# Patient Record
Sex: Male | Born: 1965 | Race: White | Hispanic: No | Marital: Single
Health system: Midwestern US, Academic
[De-identification: ages and names within clinical notes are randomized; demographics above are authoritative.]

---

## 2016-11-14 MED ORDER — OLANZAPINE 10 MG PO TAB
ORAL_TABLET | Freq: Every day | ORAL | 0 refills | 30.00000 days | Status: DC
Start: 2016-11-14 — End: 2017-01-15

## 2016-11-14 MED ORDER — OLANZAPINE 5 MG PO TAB
ORAL_TABLET | Freq: Every morning | ORAL | 0 refills | 30.00000 days | Status: DC
Start: 2016-11-14 — End: 2017-01-15

## 2016-12-10 MED ORDER — LITHIUM CARBONATE 150 MG PO CAP
ORAL_CAPSULE | ORAL | 0 refills | 90.00000 days | Status: DC
Start: 2016-12-10 — End: 2017-01-18

## 2016-12-25 MED ORDER — CLONAZEPAM 1 MG PO TAB
1 mg | ORAL_TABLET | Freq: Every day | ORAL | 0 refills | Status: DC
Start: 2016-12-25 — End: 2017-04-08

## 2016-12-25 MED ORDER — TEMAZEPAM 15 MG PO CAP
15 mg | ORAL_CAPSULE | Freq: Every evening | ORAL | 0 refills | Status: DC
Start: 2016-12-25 — End: 2017-01-21

## 2017-01-14 ENCOUNTER — Encounter: Admit: 2017-01-14 | Discharge: 2017-01-14 | Payer: MEDICARE

## 2017-01-15 ENCOUNTER — Encounter: Admit: 2017-01-15 | Discharge: 2017-01-15 | Payer: MEDICARE

## 2017-01-15 MED ORDER — OLANZAPINE 5 MG PO TAB
5 mg | ORAL_TABLET | Freq: Every morning | ORAL | 0 refills | 30.00000 days | Status: AC
Start: 2017-01-15 — End: 2017-01-18

## 2017-01-15 MED ORDER — OLANZAPINE 20 MG PO TAB
20 mg | ORAL_TABLET | Freq: Every evening | ORAL | 0 refills | 30.00000 days | Status: AC
Start: 2017-01-15 — End: 2017-01-18

## 2017-01-15 MED ORDER — OLANZAPINE 10 MG PO TAB
ORAL_TABLET | Freq: Every day | ORAL | 0 refills | 30.00000 days | Status: AC
Start: 2017-01-15 — End: 2017-01-18

## 2017-01-18 ENCOUNTER — Encounter: Admit: 2017-01-18 | Discharge: 2017-01-18 | Payer: MEDICARE

## 2017-01-18 MED ORDER — OLANZAPINE 20 MG PO TAB
20 mg | ORAL_TABLET | Freq: Every evening | ORAL | 0 refills | 30.00000 days | Status: AC
Start: 2017-01-18 — End: 2017-04-24

## 2017-01-18 MED ORDER — QUETIAPINE 100 MG PO TAB
ORAL_TABLET | 2 refills | Status: AC
Start: 2017-01-18 — End: 2017-04-24

## 2017-01-18 MED ORDER — LITHIUM CARBONATE 150 MG PO CAP
ORAL_CAPSULE | ORAL | 0 refills | 90.00000 days | Status: AC
Start: 2017-01-18 — End: 2017-03-13

## 2017-01-18 MED ORDER — LAMOTRIGINE 100 MG PO TAB
50 mg | ORAL_TABLET | Freq: Two times a day (BID) | ORAL | 0 refills | Status: AC
Start: 2017-01-18 — End: 2017-04-24

## 2017-01-18 MED ORDER — QUETIAPINE 300 MG PO TAB
ORAL_TABLET | 2 refills | Status: AC
Start: 2017-01-18 — End: 2017-04-24

## 2017-01-18 MED ORDER — OLANZAPINE 5 MG PO TBDI
5 mg | ORAL_TABLET | Freq: Every day | ORAL | 0 refills | 30.00000 days | Status: AC | PRN
Start: 2017-01-18 — End: 2017-04-24

## 2017-01-18 MED ORDER — OLANZAPINE 10 MG PO TAB
ORAL_TABLET | Freq: Every day | ORAL | 0 refills | 30.00000 days | Status: AC
Start: 2017-01-18 — End: 2017-04-24

## 2017-01-18 MED ORDER — OLANZAPINE 5 MG PO TAB
5 mg | ORAL_TABLET | Freq: Every morning | ORAL | 0 refills | 30.00000 days | Status: AC
Start: 2017-01-18 — End: 2017-04-17

## 2017-01-18 NOTE — Telephone Encounter
Mom called to reschedule apt because there was a death in the family and had to cancel the last apt, is not able to get into see the new doctor until mid August so needs a refill between now and then. Please give her a call if there is any questions or concerns.

## 2017-01-21 MED ORDER — TEMAZEPAM 15 MG PO CAP
15 mg | ORAL_CAPSULE | Freq: Every evening | ORAL | 0 refills | Status: AC
Start: 2017-01-21 — End: 2017-04-24

## 2017-01-21 MED ORDER — TEMAZEPAM 30 MG PO CAP
30 mg | ORAL_CAPSULE | Freq: Every evening | ORAL | 0 refills | Status: AC
Start: 2017-01-21 — End: 2017-04-23

## 2017-03-07 ENCOUNTER — Encounter: Admit: 2017-03-07 | Discharge: 2017-03-07 | Payer: MEDICARE

## 2017-03-07 ENCOUNTER — Ambulatory Visit: Admit: 2017-03-07 | Discharge: 2017-03-08 | Payer: MEDICARE

## 2017-03-07 DIAGNOSIS — G249 Dystonia, unspecified: ICD-10-CM

## 2017-03-07 DIAGNOSIS — F919 Conduct disorder, unspecified: ICD-10-CM

## 2017-03-07 DIAGNOSIS — R21 Rash and other nonspecific skin eruption: ICD-10-CM

## 2017-03-07 DIAGNOSIS — R12 Heartburn: ICD-10-CM

## 2017-03-07 DIAGNOSIS — Z79899 Other long term (current) drug therapy: ICD-10-CM

## 2017-03-07 DIAGNOSIS — F79 Unspecified intellectual disabilities: ICD-10-CM

## 2017-03-07 DIAGNOSIS — Z9181 History of falling: ICD-10-CM

## 2017-03-07 DIAGNOSIS — G988 Other disorders of nervous system: Principal | ICD-10-CM

## 2017-03-07 DIAGNOSIS — R51 Headache: ICD-10-CM

## 2017-03-07 DIAGNOSIS — F909 Attention-deficit hyperactivity disorder, unspecified type: ICD-10-CM

## 2017-03-07 DIAGNOSIS — K5900 Constipation, unspecified: ICD-10-CM

## 2017-03-07 DIAGNOSIS — K56609 Unspecified intestinal obstruction, unspecified as to partial versus complete obstruction: ICD-10-CM

## 2017-03-07 DIAGNOSIS — F840 Autistic disorder: ICD-10-CM

## 2017-03-07 DIAGNOSIS — R55 Syncope and collapse: ICD-10-CM

## 2017-03-07 DIAGNOSIS — E039 Hypothyroidism, unspecified: ICD-10-CM

## 2017-03-07 DIAGNOSIS — F209 Schizophrenia, unspecified: ICD-10-CM

## 2017-03-07 DIAGNOSIS — IMO0002 Unspecified mental or behavioral problem: ICD-10-CM

## 2017-03-07 DIAGNOSIS — F29 Unspecified psychosis not due to a substance or known physiological condition: ICD-10-CM

## 2017-03-07 DIAGNOSIS — R9431 Abnormal electrocardiogram [ECG] [EKG]: Principal | ICD-10-CM

## 2017-03-07 DIAGNOSIS — D72819 Decreased white blood cell count, unspecified: ICD-10-CM

## 2017-03-07 DIAGNOSIS — F312 Bipolar disorder, current episode manic severe with psychotic features: ICD-10-CM

## 2017-03-07 NOTE — Progress Notes
Date of Service: 03/07/2017    Canaan Schwendiman. is a 51 y.o. male.       HPI      Mr. Miyahira is followed for autism spectrum disorder, intellectual disability, and schizoaffective disorder bipolar type.  He is also been followed for chronic constipation.  He presents today for further follow-up of an abnormal ECG.  His mother takes superb care of him.  His mother indicates that last year he lost 20 pounds, but has regained 6 or 7 pounds over the past several months.  He had a fall on July 06, 2016 but reports no recurrence.  Otherwise, the patient has been doing well and reports no angina, congestive symptoms, palpitations, sensation of sustained forceful heart pounding, lightheadedness or syncope. His exercise tolerance has been stable.  His mother indicates that he is very active and walks long distances without any difficulty.  The patient reports no myalgias, bleeding abnormalities, neurologic motor abnormalities or difficulty with speech.  He likes to watch television.       Vitals:    03/07/17 1439   BP: 118/68   Pulse: 82   Weight: 60.8 kg (134 lb)   Height: 1.676 m (5' 6)     Body mass index is 21.63 kg/m???.     Past Medical History  Patient Active Problem List    Diagnosis Date Noted   ??? Weight loss 09/05/2016     Added automatically from request for surgery [id number]     ??? Sleep disorder, circadian, irregular sleep-wake type 10/07/2014   ??? Heart palpitations 06/03/2014   ??? Atypical chest pain 06/03/2014   ??? Incomplete right bundle branch block 06/03/2014   ??? Autism spectrum disorder with accompanying language impairment and intellectual disability, requiring substantial support 09/08/2013   ??? Bowel obstruction (HCC) 09/08/2013   ??? Encounter for long-term (current) use of other medications 03/04/2012   ??? Constipation 10/28/2010   ??? Schizoaffective disorder, bipolar type (HCC) 12/22/2008   ??? 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 Lithium poisoning 07/29/2008 ??? Delirium 07/29/2008   ??? Lithium adverse reaction 07/22/2008   ??? Acute hypernatremia 07/22/2008   ??? 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 Abdominal pain 07/22/2008   ??? Nausea & vomiting 07/22/2008   ??? Nonspecific ST-T wave electrocardiographic changes 07/22/2008   ??? Disturbance of skin sensation 08/26/2006   ??? Syncope and collapse 08/26/2006         Review of Systems   Constitution: Positive for weight loss.   HENT: Negative.    Eyes: Negative.    Respiratory: Negative.    Endocrine: Negative.    Hematologic/Lymphatic: Negative.    Skin: Positive for rash.   Musculoskeletal: Positive for neck pain and stiffness.   Gastrointestinal: Positive for bloating, abdominal pain, constipation and flatus.   Genitourinary: Positive for hesitancy.   Neurological: Positive for difficulty with concentration.   Psychiatric/Behavioral: Positive for altered mental status, depression and hallucinations. The patient has insomnia and is nervous/anxious.    Allergic/Immunologic: Positive for environmental allergies.       Physical Exam  GENERAL: The patient is slender but resting comfortably and in no distress.   HEENT: No abnormalities of the visible oro-nasopharynx, conjunctiva or sclera are noted.  NECK: There is no jugular venous distension. Carotids are palpable and without bruits. There is no thyroid enlargement.  Chest: Lung fields are clear to auscultation. There are no wheezes or crackles.  CV: There is a regular rhythm. The first  and second heart sounds are normal. There are no murmurs, gallops or rubs.  ABD: The abdomen is soft and supple with normal bowel sounds. There is no hepatosplenomegaly, ascites, tenderness, masses or bruits.  Neuro: There are no focal motor defects. Ambulation is normal. Cognitive function is abnormal.  He does follow commands promptly and can answer simple questions with a phrase  Ext: There is no edema or evidence of deep vein thrombosis. Peripheral pulses are satisfactory. SKIN: There are no rashes and no cellulitis  PSYCH: The patient is calm, rationale and oriented.    Cardiovascular Studies  A 12-lead ECG was obtained on 03/07/2017 reveals normal sinus rhythm with a rate of 82 bpm.  Incomplete right bundle branch block is noted with a QRS duration of 110 ms an ECG obtained on July 06, 2016 revealed normal sinus rhythm with a heart rate of 64 and right bundle branch block was present.    Labs from 08/02/2016 revealed total cholesterol 172, triglycerides 78, HDL 60 and LDL 99 mg/dL.  His hemoglobin A1c was 4.9%.  His TSH was normal at 4.69.  Labs from 05/29/2016 revealed ALT = 17.    A Holter monitor obtained on February 18, 2017 revealed normal sinus rhythm with a heart rate range of 62-142 bpm and a median heart rate of 81 bpm.  No arrhythmias were appreciated.  No symptoms were recorded.  This is a normal Holter monitor.    Echo-Doppler 06/14/14:  1. No regional wall motion abnormalities are seen. Overall LV systolic function appears normal. The estimated left ventricular ejection fraction is 60%.  2. Normal left ventricular diastolic function. ???  3. Right ventricular contractility appears normal.  4. Normal chamber dimensions.  5. There is no evidence of significant valvular regurgitation or stenosis by doppler exam.  6. No pericardial effusion is seen.    Problems Addressed Today  Abnormal ECG.  Hypercholesterolemia  Assessment and Plan     The patient reports no chest discomfort or congestive symptoms and he reports no palpitations or lightheadedness.  Mr. Lax has incomplete right bundle branch block, bordering on right bundle branch block this does not require further evaluation at this time.    His blood pressure appears to be well controlled. I have asked the patient to keep a log book of his BP readings and to report systolic BP readings exceeding 140 mm Hg. Regular mild aerobic exercise and adherence to a heart healthy diet were recommended. I have asked the patient to return for follow-up in 12 months.         Current Medications (including today's revisions)  ??? aspirin EC 81 mg tablet Take 81 mg by mouth daily. Take with food.   ??? atorvastatin (LIPITOR) 20 mg tablet Take 20 mg by mouth at bedtime daily.   ??? bisacodyl (DULCOLAX) 5 mg tablet Take 1 Tab by mouth twice daily.   ??? clonazePAM (KLONOPIN) 1 mg tablet Take 1 tablet by mouth daily.   ??? DOCOSAHEXANOIC ACID/EPA (FISH OIL PO) Take 2 Caps by mouth daily.   ??? docusate (COLACE) 100 mg capsule Take 100 mg by mouth twice daily.   ??? electrolyte GUT PEG (NULYTELY, COLYTE, GAVILYTE-N) 420 gram oral solution 2 day split dose   ??? glucosamine(+) 500 mg tab Take 500 mg by mouth daily.   ??? lamoTRIgine (LAMICTAL) 100 mg tablet Take 0.5 tablets by mouth twice daily.   ??? levothyroxine (SYNTHROID) 88 mcg tablet Take 88 mcg by mouth daily.   ??? linaclotide(+) (  LINZESS) 290 mcg capsule Take 1 capsule by mouth daily 30 minutes before breakfast.   ??? lithium carbonate (ESKALITH) 150 mg capsule Take 2 capsules in the morning and 3 capsules in the evening. Take with food.   ??? magnesium citrate oral solution Take 296 mL by mouth as Needed.   ??? magnesium 400 mEq.   ??? OLANZapine (ZYPREXA ZYDIS) 5 mg rapid dissolve tablet Dissolve 1 tablet by mouth daily as needed.   ??? OLANZapine (ZYPREXA) 10 mg tablet Take 1 tablet by mouth daily between noon and 2pm   ??? OLANZapine (ZYPREXA) 20 mg tablet Take 1 tablet by mouth at bedtime daily.   ??? OLANZapine (ZYPREXA) 5 mg tablet Take 1 tablet by mouth every morning.   ??? polyethylene glycol 3350 (GLYCOLAX; MIRALAX) 17 gram/dose powder Take 17 g by mouth twice daily.   ??? QUEtiapine (SEROQUEL) 100 mg tablet Take one tab am (with 300mg  for total dose 400mg ) and one tab at 2pm (with 300mg  for total dose 400mg )   ??? QUEtiapine (SEROQUEL) 300 mg tablet Take one tab am (with 100mg  tab for total dose 400mg ), one tab at 2pm (with 100mg  tab for total dose 400mg ), and two tabs at HS ??? senna (SENOKOT) 8.6 mg tablet Take 2 Tabs by mouth twice daily.   ??? sorbitol 70 % solution Take 15 mL by mouth daily as needed. constipation   ??? temazepam (RESTORIL) 15 mg capsule Take 1 capsule by mouth at bedtime daily.   ??? temazepam (RESTORIL) 30 mg capsule Take 1 capsule by mouth at bedtime daily.

## 2017-03-13 ENCOUNTER — Encounter: Admit: 2017-03-13 | Discharge: 2017-03-13 | Payer: MEDICARE

## 2017-03-13 MED ORDER — LITHIUM CARBONATE 150 MG PO CAP
ORAL_CAPSULE | ORAL | 0 refills | 90.00000 days | Status: AC
Start: 2017-03-13 — End: 2017-04-24

## 2017-03-14 ENCOUNTER — Encounter: Admit: 2017-03-14 | Discharge: 2017-03-14 | Payer: MEDICARE

## 2017-04-08 ENCOUNTER — Encounter: Admit: 2017-04-08 | Discharge: 2017-04-08 | Payer: MEDICARE

## 2017-04-08 MED ORDER — CLONAZEPAM 1 MG PO TAB
1 mg | ORAL_TABLET | Freq: Every day | ORAL | 0 refills | Status: AC
Start: 2017-04-08 — End: 2017-07-04

## 2017-04-16 ENCOUNTER — Encounter: Admit: 2017-04-16 | Discharge: 2017-04-16 | Payer: MEDICARE

## 2017-04-17 MED ORDER — OLANZAPINE 5 MG PO TAB
5 mg | ORAL_TABLET | Freq: Every morning | ORAL | 0 refills | 30.00000 days | Status: AC
Start: 2017-04-17 — End: 2017-04-24

## 2017-04-22 ENCOUNTER — Encounter: Admit: 2017-04-22 | Discharge: 2017-04-22 | Payer: MEDICARE

## 2017-04-23 MED ORDER — TEMAZEPAM 30 MG PO CAP
30 mg | ORAL_CAPSULE | Freq: Every evening | ORAL | 1 refills | Status: AC
Start: 2017-04-23 — End: 2017-07-31

## 2017-04-23 NOTE — Progress Notes
Subjective:       History of Present Illness  [name] is a 51 year old male with a history of Schizoaffective Disorder, bipolar type, Atypical autism, ADHD, mild intellectual disability, and aggression who presents for a follow up appointment with his mother who is his legal guardian.  Patient was last seen in feb 2018, and at that time was experiencing irritability for which Lamictal was increased.   ??????  Past medications: Clozaril (neutropenia); Depakote; Loxapine; Trileptal; Ritalin(for several years as a child); Elavil (worse), Trileptal, Strattera-arm dystonias, Flurazepam, Ambien, Melatonin Wellbutrin, tegretol and Neurontin d/c recently)  ???  ???Pt's guardian reports he has been struggling with constipation. He Has been doing much better with requirements of prns. He is needing them every other day. His mom reports he is still irritable and gets in their faces regularly about what he wants. His sleep is 99% of the time tolerable. But he does have some bad days with early awakening. He reports that he is pretty good today. He reports that sometimes he feels sad when people tease him. His mom reports that he does seems sad and demanding most of the time.      Pt's mom reports that there is not anything she can pinpoint that has improved over the past year. She reports that AH have probably worsened over the last year.   She reports that currently things are mostly tolerable.     Denies thoughts of harming self or others.     Pt's mom reports that he does regularly respond to internal stimuli of voices and he seems to be afraid of them. He also used to say the devil was in the living room but that has resolved.   ???  Pt states takes his/her medications as prescribed and denies any side effects at this time.  ???  Past medications: Clozaril (neutropenia); Depakote; Loxapine; Trileptal; Ritalin(for several years as a child); Elavil (worse), Trileptal, Strattera-arm dystonias, Flurazepam, Ambien, Melatonin Wellbutrin, tegretol and Neurontin d/c recently)  ???  Social History Update:  He lives with mother, father, brother (2 years younger). He attends day program: M,W,F for a workshop.    ???  Substance use:   Alcohol: denies  Tobacco: denies   Illicit substance: denies  ???    Review of Systems   HENT: Negative for congestion.    Respiratory: Negative for shortness of breath.    Gastrointestinal: Positive for abdominal pain and constipation.   Psychiatric/Behavioral: Negative for hallucinations, sleep disturbance and suicidal ideas.       Objective:         ??? aspirin EC 81 mg tablet Take 81 mg by mouth daily. Take with food.   ??? atorvastatin (LIPITOR) 20 mg tablet Take 20 mg by mouth at bedtime daily.   ??? bisacodyl (DULCOLAX) 5 mg tablet Take 1 Tab by mouth twice daily.   ??? clonazePAM (KLONOPIN) 1 mg tablet Take one tablet by mouth daily.   ??? DOCOSAHEXANOIC ACID/EPA (FISH OIL PO) Take 2 Caps by mouth daily.   ??? docusate (COLACE) 100 mg capsule Take 100 mg by mouth twice daily.   ??? electrolyte GUT PEG (NULYTELY, COLYTE, GAVILYTE-N) 420 gram oral solution 2 day split dose   ??? glucosamine(+) 500 mg tab Take 500 mg by mouth daily.   ??? lamoTRIgine (LAMICTAL) 100 mg tablet Take 0.5 tablets by mouth twice daily.   ??? levothyroxine (SYNTHROID) 88 mcg tablet Take 88 mcg by mouth daily.   ??? linaclotide(+) Karlene Einstein)  290 mcg capsule Take 1 capsule by mouth daily 30 minutes before breakfast.   ??? lithium carbonate (ESKALITH) 150 mg capsule TAKE 2 CAPSULES BY MOUTH EVERY MORNING AND 3 CAPS IN THE EVENING WITH FOOD   ??? magnesium citrate oral solution Take 296 mL by mouth as Needed.   ??? magnesium 400 mEq.   ??? OLANZapine (ZYPREXA ZYDIS) 5 mg rapid dissolve tablet Dissolve 1 tablet by mouth daily as needed.   ??? OLANZapine (ZYPREXA) 10 mg tablet Take 1 tablet by mouth daily between noon and 2pm   ??? OLANZapine (ZYPREXA) 20 mg tablet Take 1 tablet by mouth at bedtime daily. ??? OLANZapine (ZYPREXA) 5 mg tablet Take one tablet by mouth every morning.   ??? polyethylene glycol 3350 (GLYCOLAX; MIRALAX) 17 gram/dose powder Take 17 g by mouth twice daily.   ??? QUEtiapine (SEROQUEL) 100 mg tablet Take one tab am (with 300mg  for total dose 400mg ) and one tab at 2pm (with 300mg  for total dose 400mg )   ??? QUEtiapine (SEROQUEL) 300 mg tablet Take one tab am (with 100mg  tab for total dose 400mg ), one tab at 2pm (with 100mg  tab for total dose 400mg ), and two tabs at HS   ??? senna (SENOKOT) 8.6 mg tablet Take 2 Tabs by mouth twice daily.   ??? sorbitol 70 % solution Take 15 mL by mouth daily as needed. constipation   ??? temazepam (RESTORIL) 15 mg capsule Take 1 capsule by mouth at bedtime daily.   ??? temazepam (RESTORIL) 30 mg capsule Take one capsule by mouth at bedtime daily.     Vitals:    04/24/17 1310   BP: 104/86   Pulse: 101   Weight: 63 kg (138 lb 12.8 oz)     Body mass index is 22.4 kg/m???.     Physical Exam   Psychiatric:   MENTAL STATUS EXAMINATION  General/Constitutional: 51 yo appears stated age, dressed in personal clothes, fair groomed  Eye Contact: fair  Behavior:  Cooperative, with some interupting  Speech:  RRR with normal volume and tone.  Fair articulation  Mood: pretty good  Affect: blunted ; mood  congruent  Thought Process:  Linear and goal directed  Thought Content:  denies SI, HI. No evidence of delusions  Perception:  Denies AVH  Associations:  Intact  Insight/Judgment: limited/limited    Physical Exam:  Neuro:  No gross neurologic deficits.   Musculoskeletal:  Moves all four extremities spontaneously       Metabolic monitoring:  Metabolic monitoring:     Body mass index is 22.4 kg/m???.  Wt Readings from Last 3 Encounters:   04/24/17 63 kg (138 lb 12.8 oz)   03/07/17 60.8 kg (134 lb)   09/12/16 63.5 kg (140 lb)     BP Readings from Last 3 Encounters:   04/24/17 104/86   03/07/17 118/68   09/12/16 (!) 148/96     No Data Recorded  Lab Results   Component Value Date CHOL 172 08/02/2016    TRIG 78 08/02/2016    HDL 60 08/02/2016    LDL 99 08/02/2016    VLDL 16 08/02/2016    NONHDLCHOL 87 06/18/2013    CHOLHDLC 3 08/02/2016     Hemoglobin A1C   Date Value Ref Range Status   08/02/2016 4.9 4.5 - 6.5 % Final         Assessment and Plan:  IMPRESSION:   Schizoaffective disorder, Bipolar type  Autism Spectrum Disorder  ADHD  ID  History of Tic Disorder  Hypothyroidism, pancreatitis, HLD  ???  ???  PLAN:  ???  - Continue case management with Ericka Pontiff  - Continue Zyprexa 5mg  QAM, 10mg  at noon, and 20mg  QHS for aggression with additional 5mg  prn for severe agitation.   - Increase to Lamictal 50mg  qam and 100mg  QHS: likely titrate up to 200mg  daily at next visit.   - Continue Seroquel 400mg , 400mg  and 600mg  for behaviors/mood.               - lipids and HA1c WNL on 08/02/16               -AIMS 1 tongue fasciculations on 04/24/17  - Continue Lithium at 300mg  QAM and 450mg  QHS for mood               - Lithium level at 0.8 on 08/02/16  - Continue Clonazepam 1mg  QHS for irritability and sleep disturbance  - Continue Restoril 45mg  po qhs for sleep.  - strongly  recommend following up with PCP to monitor/ work up weight loss and constipation  - Continue working with GI on constipation/swallow study as this could contribute to behaviors.      - RTC  3 months for 60 minute appointment   ???  Discussed safety plan if the patient were to have thoughts of harming themselves or others. Patient was instructed to call 911 and/or come to the emergency room. Patient was agreeable.   ???  Medication risks and benefits were discussed with patient. Patient was agreeable to medication changes noted above.   ???  Discussed the dangers of respiratory depression and death when using alcohol, opioids, and benzodiazepines. Patient understood the risks.   ???  Discussed risks of metabolic side effects with antipsychotic use including frequent lab draws to monitor HA1c, lipids, and BMI. Patient was understanding.   ??? The proposed treatment plan was discussed with the patient/guardian who was provided the opportunity to ask questions and make suggestions regarding alternative treatment.   ???  Patient was discussed with Dr. Alphonsus Sias who agrees with the plan above.

## 2017-04-24 ENCOUNTER — Ambulatory Visit: Admit: 2017-04-24 | Discharge: 2017-04-24 | Payer: MEDICARE

## 2017-04-24 ENCOUNTER — Encounter: Admit: 2017-04-24 | Discharge: 2017-04-24 | Payer: MEDICARE

## 2017-04-24 DIAGNOSIS — F79 Unspecified intellectual disabilities: ICD-10-CM

## 2017-04-24 DIAGNOSIS — F909 Attention-deficit hyperactivity disorder, unspecified type: ICD-10-CM

## 2017-04-24 DIAGNOSIS — G249 Dystonia, unspecified: ICD-10-CM

## 2017-04-24 DIAGNOSIS — F29 Unspecified psychosis not due to a substance or known physiological condition: ICD-10-CM

## 2017-04-24 DIAGNOSIS — E039 Hypothyroidism, unspecified: ICD-10-CM

## 2017-04-24 DIAGNOSIS — F312 Bipolar disorder, current episode manic severe with psychotic features: ICD-10-CM

## 2017-04-24 DIAGNOSIS — IMO0002 Unspecified mental or behavioral problem: ICD-10-CM

## 2017-04-24 DIAGNOSIS — R12 Heartburn: ICD-10-CM

## 2017-04-24 DIAGNOSIS — F902 Attention-deficit hyperactivity disorder, combined type: ICD-10-CM

## 2017-04-24 DIAGNOSIS — F840 Autistic disorder: ICD-10-CM

## 2017-04-24 DIAGNOSIS — R51 Headache: ICD-10-CM

## 2017-04-24 DIAGNOSIS — F209 Schizophrenia, unspecified: ICD-10-CM

## 2017-04-24 DIAGNOSIS — R55 Syncope and collapse: ICD-10-CM

## 2017-04-24 DIAGNOSIS — G988 Other disorders of nervous system: Principal | ICD-10-CM

## 2017-04-24 DIAGNOSIS — K56609 Unspecified intestinal obstruction, unspecified as to partial versus complete obstruction: ICD-10-CM

## 2017-04-24 DIAGNOSIS — F919 Conduct disorder, unspecified: ICD-10-CM

## 2017-04-24 DIAGNOSIS — K5900 Constipation, unspecified: ICD-10-CM

## 2017-04-24 DIAGNOSIS — G4723 Circadian rhythm sleep disorder, irregular sleep wake type: ICD-10-CM

## 2017-04-24 DIAGNOSIS — Z79899 Other long term (current) drug therapy: ICD-10-CM

## 2017-04-24 DIAGNOSIS — Z9181 History of falling: ICD-10-CM

## 2017-04-24 DIAGNOSIS — F250 Schizoaffective disorder, bipolar type: Principal | ICD-10-CM

## 2017-04-24 DIAGNOSIS — D72819 Decreased white blood cell count, unspecified: ICD-10-CM

## 2017-04-24 DIAGNOSIS — R21 Rash and other nonspecific skin eruption: ICD-10-CM

## 2017-04-24 MED ORDER — TEMAZEPAM 15 MG PO CAP
15 mg | ORAL_CAPSULE | Freq: Every evening | ORAL | 1 refills | Status: AC
Start: 2017-04-24 — End: 2017-07-31

## 2017-04-24 MED ORDER — QUETIAPINE 100 MG PO TAB
ORAL_TABLET | 2 refills | Status: AC
Start: 2017-04-24 — End: 2017-07-31

## 2017-04-24 MED ORDER — LAMOTRIGINE 100 MG PO TAB
ORAL_TABLET | 1 refills | Status: AC
Start: 2017-04-24 — End: 2017-07-31

## 2017-04-24 MED ORDER — OLANZAPINE 5 MG PO TBDI
5 mg | ORAL_TABLET | Freq: Every day | ORAL | 1 refills | 30.00000 days | Status: AC | PRN
Start: 2017-04-24 — End: 2017-07-31

## 2017-04-24 MED ORDER — LITHIUM CARBONATE 150 MG PO CAP
ORAL_CAPSULE | ORAL | 1 refills | 90.00000 days | Status: AC
Start: 2017-04-24 — End: 2017-07-31

## 2017-04-24 MED ORDER — OLANZAPINE 20 MG PO TAB
20 mg | ORAL_TABLET | Freq: Every evening | ORAL | 1 refills | 30.00000 days | Status: AC
Start: 2017-04-24 — End: 2017-07-31

## 2017-04-24 MED ORDER — OLANZAPINE 5 MG PO TAB
5 mg | ORAL_TABLET | Freq: Every morning | ORAL | 1 refills | 30.00000 days | Status: AC
Start: 2017-04-24 — End: 2017-07-31

## 2017-04-24 MED ORDER — OLANZAPINE 10 MG PO TAB
ORAL_TABLET | Freq: Every day | ORAL | 1 refills | 30.00000 days | Status: AC
Start: 2017-04-24 — End: 2017-07-31

## 2017-04-24 MED ORDER — QUETIAPINE 300 MG PO TAB
ORAL_TABLET | 2 refills | Status: AC
Start: 2017-04-24 — End: 2017-07-31

## 2017-04-24 NOTE — Progress Notes
ATTENDING NOTE      Encounter Date: 04/24/2017    I saw and evaluated Chad Randall., discussed with Earlyne Iba, MD and concur with the assessment and treatment plan.     PRESENTING PROBLEM AND BACKGROUND: Patient is 51 y.o. male with schizoaffective disorder, autism, ADHD, and mild MR living with parents for past 10 years. He has history of major mood swings and aggressive behavior and has been relatively stable on Zyprexa, Seroquel, Tegretol, lithium, clonazepam, and lamotrigine. Attempts to taper or discontinue some of these medications has resulted in exacerbation of symptoms and resumption of previous doses. Exception has been successful taper and discontinuation of Ability.  Weight and metabolic labs remained in normal range with persistent problem with constipation followed by GI doc. Of note, prior trial of clonidine for hyperactivity ineffective. Parents could not recall trial of stimulants. Last visit no change in medications.    CURRENT TREATMENT AND RESPONSES: Patient's mother reported irritability persists requiring an rare prn dose olanzapine in addition to 35 mg dose he receives daily. Mood is good, no physical aggression although intermittently irritable.  Attending day program without reports of problem behavior. Severe constipation continues and is follwed by GI doc. Mother agreeable to increaisng lamotrigine and maintaining other medications at current doses.         PLAN:  1. Continue quetiapine 1400 mg/d in divided doses, olanzapine 30 mg qd in divided dose, lithium 750 mg qd (last blood level 0.8), clonazepam 1 mg qd,and temazepam 45 mg qhs  2. Increase lamotrigine from 50 mg qhs and 50 mg qam to 100 mg qhs and 50 mg qam  3. Continue to monitor metabolic syndrome labs and weight closely  4. Monitor for TD with AIMS  5. Check previous records for clozapine and stimulant trials  6. Monitor aggression, drowsiness, affect, anxiety, insomnia, appetite, constipation closely

## 2017-05-20 ENCOUNTER — Encounter: Admit: 2017-05-20 | Discharge: 2017-05-20 | Payer: MEDICARE

## 2017-05-20 DIAGNOSIS — R634 Abnormal weight loss: Principal | ICD-10-CM

## 2017-05-20 MED ORDER — SODIUM CHLORIDE 0.9 % IV SOLP
INTRAVENOUS | 0 refills | Status: CN
Start: 2017-05-20 — End: ?

## 2017-05-20 MED ORDER — PEG-ELECTROLYTE SOLN 420 GRAM PO SOLR
0 refills | Status: AC
Start: 2017-05-20 — End: 2017-12-03

## 2017-05-20 NOTE — Telephone Encounter
Prep for case entered for EGDEUS & Colonoscopy asap at [facility] only with Dr. Milta Deiters or Dr. Keenan Bachelor.  Patient given 2 day golytely prep sheet today during office visit patient was present during Mother's visit with Dr. Jomarie Longs.

## 2017-05-23 ENCOUNTER — Encounter: Admit: 2017-05-23 | Discharge: 2017-05-23 | Payer: MEDICARE

## 2017-05-23 NOTE — Telephone Encounter
Spoke with pt mother Josem Kaufmann on file to discuss [place]) pt is scheduled for EGD/EUS/Colon with Dr. Arnetha Gula soonest available 06/17/17 1500. (Pt mother has requested pt be placed on cancellation list. Take complete).     Pt mother denies pt being diabetic or on blood thinning medication. Pt mother is  Aware that pt will need to complete 2 day 2 kit bowel prep. Pt mother  is aware of location of procedure. Pt is aware they will need a driver due to IV sedation. All questions answered. Prep instructions mailed to pts home address. No further f/u needed at this time.

## 2017-05-27 ENCOUNTER — Ambulatory Visit: Admit: 2017-05-27 | Discharge: 2017-05-27 | Payer: MEDICARE

## 2017-05-27 ENCOUNTER — Encounter: Admit: 2017-05-27 | Discharge: 2017-05-27 | Payer: MEDICARE

## 2017-05-27 DIAGNOSIS — F29 Unspecified psychosis not due to a substance or known physiological condition: ICD-10-CM

## 2017-05-27 DIAGNOSIS — K56609 Unspecified intestinal obstruction, unspecified as to partial versus complete obstruction: ICD-10-CM

## 2017-05-27 DIAGNOSIS — R55 Syncope and collapse: ICD-10-CM

## 2017-05-27 DIAGNOSIS — R12 Heartburn: ICD-10-CM

## 2017-05-27 DIAGNOSIS — F79 Unspecified intellectual disabilities: ICD-10-CM

## 2017-05-27 DIAGNOSIS — D72819 Decreased white blood cell count, unspecified: ICD-10-CM

## 2017-05-27 DIAGNOSIS — Z9181 History of falling: ICD-10-CM

## 2017-05-27 DIAGNOSIS — G988 Other disorders of nervous system: Principal | ICD-10-CM

## 2017-05-27 DIAGNOSIS — F909 Attention-deficit hyperactivity disorder, unspecified type: ICD-10-CM

## 2017-05-27 DIAGNOSIS — K5900 Constipation, unspecified: ICD-10-CM

## 2017-05-27 DIAGNOSIS — F209 Schizophrenia, unspecified: ICD-10-CM

## 2017-05-27 DIAGNOSIS — Z125 Encounter for screening for malignant neoplasm of prostate: ICD-10-CM

## 2017-05-27 DIAGNOSIS — E039 Hypothyroidism, unspecified: ICD-10-CM

## 2017-05-27 DIAGNOSIS — R350 Frequency of micturition: Principal | ICD-10-CM

## 2017-05-27 DIAGNOSIS — Z79899 Other long term (current) drug therapy: ICD-10-CM

## 2017-05-27 DIAGNOSIS — G249 Dystonia, unspecified: ICD-10-CM

## 2017-05-27 DIAGNOSIS — IMO0002 Unspecified mental or behavioral problem: ICD-10-CM

## 2017-05-27 DIAGNOSIS — R51 Headache: ICD-10-CM

## 2017-05-27 DIAGNOSIS — R21 Rash and other nonspecific skin eruption: ICD-10-CM

## 2017-05-27 DIAGNOSIS — R399 Unspecified symptoms and signs involving the genitourinary system: ICD-10-CM

## 2017-05-27 DIAGNOSIS — R310 Gross hematuria: ICD-10-CM

## 2017-05-27 DIAGNOSIS — F919 Conduct disorder, unspecified: ICD-10-CM

## 2017-05-27 DIAGNOSIS — F312 Bipolar disorder, current episode manic severe with psychotic features: ICD-10-CM

## 2017-05-27 DIAGNOSIS — F840 Autistic disorder: ICD-10-CM

## 2017-05-27 LAB — PSA SCREEN: Lab: 0.5 ng/mL (ref <2.01)

## 2017-05-27 NOTE — Progress Notes
Date of Service: 05/27/2017     Subjective:             Chad Ebsen. is a 51 y.o. male, new patient here for evaluation of increasing LUTS    Chief Complaint   Patient presents with   ??? Other     frequency       History of Present Illness      Chad Bea. is a 51 y.o. Male, with past medical history including schizophrenia, bipolar disorder, autism, ADHD, constipation, small bowel obstruction, taking lithium.     He presents today with his mother she reports most of his history.  Reports increasing urinary hesitancy and urinary frequency, voiding every 30 minutes, increased over the last year.  She also reports chronic dysuria.  Intermittent episodes of gross hematuria most recently this week, unaware of a trigger.  Possibly related to severe constipation.  She also reports urgency urge incontinence, occasionally wearing pull-ups can be anywhere from 3 or 4 daily.  Nocturia x2.  Straight catheterization required during ER evaluation approximately 6 months ago associated with constipation and unable to urinate at that time.  Unclear if this was for urinary retention or to obtain a urine specimen.    She reports history of recurrent urinary tract infections usually 1 or 2/year.    Bowel movement-every 5 days, working with GI planning colonoscopy June 17, 2017.  Intake-water and Gatorade.    Family history positive for mother-kidney cancer.    Denies history of kidney stones, diabetes, stroke, back surgery, pelvic trauma, tobacco, trial of overactive bladder medication      05/27/17   PVR 180 MLs, PVR #2 3cc    Questionaires: 05/27/17  UDI-6: 12  IIQ-7: 21  OAB-V8: 42  AUASS: 32  QoL: 6        Past Medical History:   Diagnosis Date   ??? ADHD (attention deficit hyperactivity disorder)    ??? Adverse effect     neuroleptic drugs   ??? Autism    ??? Behavior disturbance    ??? Bipolar affective disorder, manic, severe, with psychotic behavior (HCC)    ??? Constipation 10/28/2010   ??? Dystonia ??? Headache(784.0) 08/26/2006   ??? Heartburn symptom    ??? History of fall     usually associated with urination or getting to feet   ??? Hypothyroidism    ??? Long term use of drug     antipsychotic drugs   ??? Mental retardation    ??? Psychosis (HCC)    ??? Rash     raised red rash, itches on belly, trunk, and underarms   ??? SBO (small bowel obstruction) (HCC)    ??? Schizophrenia (HCC)    ??? Syncope and collapse 08/26/2006   ??? Unspecified disorders of nervous system    ??? Unspecified mental or behavioral problem    ??? WBC decreased          Past Surgical History:   Procedure Laterality Date   ??? HX BUNIONECTOMY     ??? HX TONSIL AND ADENOIDECTOMY     ??? HX VASECTOMY         Allergies:    Allergies   Allergen Reactions   ??? Diphenhydramine-Zinc Acetate AGITATION     Patient becomes agiitated and combative   ??? Haloperidol Lactate AGITATION     Severe agitation per mother   ??? Albuterol SEE COMMENTS     Extreme hyperactivity-per mom   ??? Amitriptyline SEE COMMENTS  Uncontrollable muscle movements per mother   ??? Antihistamine-1 AGITATION     Patient becomes agiitated and combative   ??? Atomoxetine UNKNOWN   ??? Bee Sting [Venom-Honey Bee] UNKNOWN   ??? Chlorpheniram-Dm-Acetaminophen AGITATION     Patient becomes agiitated and combative   ??? Clemastine UNKNOWN   ??? Clozaril [Clozapine] AGITATION     Extreme anger/agitation   ??? Depakote [Divalproex] SEE COMMENTS     Per mother worked well to control behavior, but caused liver tests to become abnormal so was stopped.     ??? Erythromycin RASH     Allergy recorded in SMS: E-MYCIN~Reactions: RASH   ??? Flurazepam UNKNOWN   ??? Melatonin AGITATION   ??? Phenylpropanolamine UNKNOWN   ??? Pseudoephedrine UNKNOWN   ??? Tripelennamine UNKNOWN   ??? Triprolidine UNKNOWN   ??? Zolpidem AGITATION         Social History     Social History   ??? Marital status: Single     Spouse name: N/A   ??? Number of children: N/A   ??? Years of education: N/A     Occupational History   ???  [employer] Social History Main Topics   ??? Smoking status: Never Smoker   ??? Smokeless tobacco: Never Used   ??? Alcohol use No   ??? Drug use: No   ??? Sexual activity: Not on file     Other Topics Concern   ??? Not on file     Social History Narrative   ??? No narrative on file          Family History   Problem Relation Age of Onset   ??? Other Father    ??? Bipolar Disorder Father    ??? Hypertension Father    ??? Heart Attack Father    ??? High Cholesterol Father    ??? Stroke Father    ??? Depression Father    ??? Other Paternal Grandfather    ??? Heart Attack Paternal Grandfather    ??? Hypertension Paternal Grandfather    ??? Stroke Paternal Grandfather    ??? Depression Paternal Grandfather    ??? Colon Polyps Mother    ??? High Cholesterol Mother    ??? Migraines Mother    ??? Heart Attack Maternal Grandfather    ??? Stroke Maternal Grandfather    ??? Heart Attack Paternal Grandmother    ??? Cancer-Colon Neg Hx             Review of Systems   Constitutional: Positive for chills and unexpected weight change. Negative for activity change, appetite change, diaphoresis, fatigue and fever.   HENT: Negative for congestion, hearing loss, mouth sores and sinus pressure.    Eyes: Negative for visual disturbance.   Respiratory: Negative for apnea, cough, chest tightness and shortness of breath.    Cardiovascular: Negative for chest pain, palpitations and leg swelling.   Gastrointestinal: Positive for abdominal pain. Negative for anal bleeding, blood in stool, constipation, diarrhea, nausea, rectal pain and vomiting.   Genitourinary: Positive for decreased urine volume, difficulty urinating, dysuria, enuresis, frequency and urgency.        See HPI   Musculoskeletal: Negative for arthralgias, back pain, gait problem and myalgias.   Skin: Negative for rash and wound.   Neurological: Negative for dizziness, tremors, seizures, syncope, weakness, light-headedness, numbness and headaches.   Hematological: Negative for adenopathy. Does not bruise/bleed easily. Psychiatric/Behavioral: Positive for decreased concentration and dysphoric mood. The patient is nervous/anxious.  Objective:         ??? aspirin EC 81 mg tablet Take 81 mg by mouth daily. Take with food.   ??? atorvastatin (LIPITOR) 20 mg tablet Take 20 mg by mouth at bedtime daily.   ??? bisacodyl (DULCOLAX) 5 mg tablet Take 1 Tab by mouth twice daily.   ??? clonazePAM (KLONOPIN) 1 mg tablet Take one tablet by mouth daily.   ??? DOCOSAHEXANOIC ACID/EPA (FISH OIL PO) Take 2 Caps by mouth daily.   ??? docusate (COLACE) 100 mg capsule Take 100 mg by mouth twice daily.   ??? electrolyte GUT PEG (NULYTELY, COLYTE, GAVILYTE-N) 420 gram oral solution Per your 2 day colonoscopy prep sheet   ??? electrolyte GUT PEG (NULYTELY, COLYTE, GAVILYTE-N) 420 gram oral solution 2 day split dose   ??? glucosamine(+) 500 mg tab Take 500 mg by mouth daily.   ??? lamoTRIgine (LAMICTAL) 100 mg tablet Take 1/2 tab in the morning and 1 tab at night for mood   ??? levothyroxine (SYNTHROID) 88 mcg tablet Take 88 mcg by mouth daily.   ??? linaclotide(+) (LINZESS) 290 mcg capsule Take 1 capsule by mouth daily 30 minutes before breakfast.   ??? lithium carbonate (ESKALITH) 150 mg capsule Take 2 capsules by mouth every morning and 3 caps in the evening. Take with food.   ??? magnesium citrate oral solution Take 296 mL by mouth as Needed.   ??? magnesium 400 mEq.   ??? OLANZapine (ZYPREXA ZYDIS) 5 mg rapid dissolve tablet Dissolve one tablet by mouth daily as needed.   ??? OLANZapine (ZYPREXA) 10 mg tablet Take 1 tablet by mouth daily between noon and 2pm   ??? OLANZapine (ZYPREXA) 20 mg tablet Take one tablet by mouth at bedtime daily.   ??? OLANZapine (ZYPREXA) 5 mg tablet Take one tablet by mouth every morning.   ??? polyethylene glycol 3350 (GLYCOLAX; MIRALAX) 17 gram/dose powder Take 17 g by mouth twice daily.   ??? QUEtiapine (SEROQUEL) 100 mg tablet Take one tab am (with 300mg  for total dose 400mg ) and one tab at 2pm (with 300mg  for total dose 400mg ) ??? QUEtiapine (SEROQUEL) 300 mg tablet Take one tab am (with 100mg  tab for total dose 400mg ), one tab at 2pm (with 100mg  tab for total dose 400mg ), and two tabs at HS   ??? senna (SENOKOT) 8.6 mg tablet Take 2 Tabs by mouth twice daily.   ??? sorbitol 70 % solution Take 15 mL by mouth daily as needed. constipation   ??? temazepam (RESTORIL) 15 mg capsule Take one capsule by mouth at bedtime daily.   ??? temazepam (RESTORIL) 30 mg capsule Take one capsule by mouth at bedtime daily.       Vitals:    05/27/17 0952   BP: (!) 114/93   Pulse: 71   Weight: 58.4 kg (128 lb 12.8 oz)   Height: 167.6 cm (66)       Body mass index is 20.79 kg/m???.       Physical Exam   Constitutional: He is oriented to person, place, and time. He appears well-developed and well-nourished.   HENT:   Head: Normocephalic.   Eyes: Conjunctivae are normal.   Neck: Normal range of motion.   Cardiovascular: Normal rate.    Pulmonary/Chest: Effort normal.   Abdominal: Soft.   Musculoskeletal: Normal range of motion.   Neurological: He is alert and oriented to person, place, and time.   Skin: Skin is warm and dry.   Psychiatric: He has a normal mood  and affect.     *did not complete pelvic or DRE exam, patient deferrred         Most recent available [place] labs:  Comprehensive Metabolic Profile    Lab Results   Component Value Date/Time    NA 142 05/29/2016    K 4.8 05/29/2016    CL 108 05/29/2016    CO2 25 05/29/2016    GAP 11 10/11/2015    BUN 25 05/29/2016    CR 1.21 05/29/2016    GLU 91 05/29/2016    GLU 98 12/30/2005 07:59 AM    Lab Results   Component Value Date/Time    CA 10.0 05/29/2016    PO4 2.5 09/08/2013 10:00 AM    ALBUMIN 4.7 05/29/2016    TOTPROT 6.3 05/29/2016    ALKPHOS 79 05/29/2016    AST 15 05/29/2016    ALT 17 05/29/2016    TOTBILI 0.6 05/29/2016    GFR 69 05/29/2016    GFRAA 80 05/29/2016        Urine    Lab Results   Component Value Date/Time    UCOLOR YELLOW 05/27/2017 09:58 AM    TURBID CLOUDY 05/27/2017 09:58 AM USPGR 1.006 02/08/2012 08:37 PM    UPH 7.0 02/08/2012 08:37 PM    UPROTEIN NEG 02/08/2012 08:37 PM    UAGLU NEG 02/08/2012 08:37 PM    UKET NEG 02/08/2012 08:37 PM    UBILE NEG 02/08/2012 08:37 PM    UBLD NEG 02/08/2012 08:37 PM    UROB NORMAL 02/08/2012 08:37 PM    Lab Results   Component Value Date/Time    UNIT NEG 02/08/2012 08:37 PM    ULEU NEG 02/08/2012 08:37 PM    UWBC NONE 02/08/2012 08:37 PM    URBC NONE 02/08/2012 08:37 PM          Culture   Date Value Ref Range Status   02/10/2012 NO GROWTH  Final     No urine cultures for review    PSA Screen   Date Value Ref Range Status   05/27/2017 0.58 <2.01 NG/ML Final     Comment:     REFERENCE RANGES  AGE      PSA VALUE  <50        <=1.5  50-54       <=2.0  55-59       <=3.0  60-69       <=4.0  70+        <=6.0     12/30/2005 0.50 <4.0 NG/ML Final       [place] imaging:     CT Abd/Pel with contrast 08/07/16  IMPRESSION  Unremarkable CT of the abdomen/pelvis without primary neoplasm or   metastatic disease ???    Calculated prostate volume 5.2 x 3.3 x 2.9 = 26 grams    Outside imaging: none for review    Assessment and Plan:    Problem   Lower Urinary Tract Symptoms (Luts)    Consult 05/27/17  #1 complaint - increased LUTS x 1 year  Chronic dysuria, Frequency every 30 minutes, urge and urge incontinence, occasionally wearing Depends 3-4 day, nocturia x 2  No trial of OAB medications    05/27/17   PVR 180 MLs, PVR #2 3cc         Gross Hematuria    Intermittent gross hematuria     Constipation    Working with GI  BM every 5 days  Problem List Items Addressed This Visit     Constipation     Continue plan per GI, planning colonoscopy 11/19  Was going to see if cystoscopy could be coordinated  However colonoscopy will be at [facility]         Lower urinary tract symptoms (LUTS)     Manage constipation  Complete hematuria evaluation  PSA  Hold on medications at this time         Gross hematuria     Recommend CT urogram  Will need cystoscopy, likely with light sedation Needs to be scheduled           Relevant Orders    CYTOLOGY URINES    CT ABD/PELV WO/W CONTRAST      Other Visit Diagnoses     Urinary frequency    -  Primary    Relevant Orders    POC URINE DIPSTICK MANUAL READ (Completed)    Prostate cancer screening        Relevant Orders    PSA SCREEN (Completed)        Orders Placed This Encounter   ??? CT ABD/PELV WO/W CONTRAST (Urogram)   ??? PSA SCREEN   ??? POC URINE DIPSTICK   ??? URINES CYTOLOGY       Gershon Cull, PA-C  Urology    Dr Dr. Oswaldo Milian also met with patient and determined plan of care    Will send letter to PMD - Gwenette Greet    ATTESTATION    I personally performed the key portions of the E/M visit, discussed case with Physician Assistant and concur with documentation of history, physical exam, assessment, and treatment plan unless otherwise noted.    Staff name:  Mittie Bodo, MD Date:  05/28/2017

## 2017-05-27 NOTE — Progress Notes
PVR 180 MLs

## 2017-05-28 ENCOUNTER — Encounter: Admit: 2017-05-28 | Discharge: 2017-05-28 | Payer: MEDICARE

## 2017-05-28 DIAGNOSIS — K5900 Constipation, unspecified: ICD-10-CM

## 2017-05-28 DIAGNOSIS — E039 Hypothyroidism, unspecified: ICD-10-CM

## 2017-05-28 DIAGNOSIS — R51 Headache: ICD-10-CM

## 2017-05-28 DIAGNOSIS — F312 Bipolar disorder, current episode manic severe with psychotic features: ICD-10-CM

## 2017-05-28 DIAGNOSIS — F840 Autistic disorder: ICD-10-CM

## 2017-05-28 DIAGNOSIS — G249 Dystonia, unspecified: ICD-10-CM

## 2017-05-28 DIAGNOSIS — F919 Conduct disorder, unspecified: ICD-10-CM

## 2017-05-28 DIAGNOSIS — F29 Unspecified psychosis not due to a substance or known physiological condition: ICD-10-CM

## 2017-05-28 DIAGNOSIS — R21 Rash and other nonspecific skin eruption: ICD-10-CM

## 2017-05-28 DIAGNOSIS — D72819 Decreased white blood cell count, unspecified: ICD-10-CM

## 2017-05-28 DIAGNOSIS — F909 Attention-deficit hyperactivity disorder, unspecified type: ICD-10-CM

## 2017-05-28 DIAGNOSIS — Z79899 Other long term (current) drug therapy: ICD-10-CM

## 2017-05-28 DIAGNOSIS — R12 Heartburn: ICD-10-CM

## 2017-05-28 DIAGNOSIS — K56609 Unspecified intestinal obstruction, unspecified as to partial versus complete obstruction: ICD-10-CM

## 2017-05-28 DIAGNOSIS — R55 Syncope and collapse: ICD-10-CM

## 2017-05-28 DIAGNOSIS — F209 Schizophrenia, unspecified: ICD-10-CM

## 2017-05-28 DIAGNOSIS — G988 Other disorders of nervous system: Principal | ICD-10-CM

## 2017-05-28 DIAGNOSIS — F79 Unspecified intellectual disabilities: ICD-10-CM

## 2017-05-28 DIAGNOSIS — Z9181 History of falling: ICD-10-CM

## 2017-05-28 DIAGNOSIS — IMO0002 Unspecified mental or behavioral problem: ICD-10-CM

## 2017-05-28 NOTE — Assessment & Plan Note
Recommend CT urogram  Will need cystoscopy, likely with light sedation

## 2017-05-28 NOTE — Assessment & Plan Note
Continue plan per GI, planning colonoscopy 11/19  Was going to see if cystoscopy could be coordinated  However colonoscopy will be at [facility]

## 2017-05-28 NOTE — Assessment & Plan Note
Manage constipation  Complete hematuria evaluation  PSA  Hold on medications at this time

## 2017-06-01 ENCOUNTER — Ambulatory Visit: Admit: 2017-06-01 | Discharge: 2017-06-01 | Payer: MEDICARE

## 2017-06-01 DIAGNOSIS — R310 Gross hematuria: Principal | ICD-10-CM

## 2017-06-01 LAB — POC CREATININE, RAD: Lab: 1.1 mg/dL (ref 0.4–1.24)

## 2017-06-01 MED ORDER — SODIUM CHLORIDE 0.9 % IJ SOLN
50 mL | Freq: Once | INTRAVENOUS | 0 refills | Status: CP
Start: 2017-06-01 — End: ?
  Administered 2017-06-01: 16:00:00 50 mL via INTRAVENOUS

## 2017-06-01 MED ORDER — IOPAMIDOL 76 % IV SOLN
75 mL | Freq: Once | INTRAVENOUS | 0 refills | Status: CP
Start: 2017-06-01 — End: ?
  Administered 2017-06-01: 16:00:00 75 mL via INTRAVENOUS

## 2017-06-17 ENCOUNTER — Encounter: Admit: 2017-06-17 | Discharge: 2017-06-17 | Payer: MEDICARE

## 2017-06-17 ENCOUNTER — Ambulatory Visit: Admit: 2017-06-17 | Discharge: 2017-06-17 | Payer: MEDICARE

## 2017-06-17 DIAGNOSIS — R634 Abnormal weight loss: Principal | ICD-10-CM

## 2017-06-17 MED ORDER — LACTATED RINGERS IV SOLP
0 refills | Status: DC
Start: 2017-06-17 — End: 2017-06-18
  Administered 2017-06-17: via INTRAVENOUS

## 2017-06-17 MED ORDER — PROPOFOL 10 MG/ML IV EMUL 20 ML (INFUSION)(AM)(OR)
INTRAVENOUS | 0 refills | Status: DC
Start: 2017-06-17 — End: 2017-06-18
  Administered 2017-06-17: 125 ug/kg/min via INTRAVENOUS

## 2017-06-17 MED ORDER — LIDOCAINE (PF) 200 MG/10 ML (2 %) IJ SYRG
0 refills | Status: DC
Start: 2017-06-17 — End: 2017-06-18
  Administered 2017-06-17: 80 mg via INTRAVENOUS

## 2017-06-17 MED ORDER — PROPOFOL INJ 10 MG/ML IV VIAL
0 refills | Status: DC
Start: 2017-06-17 — End: 2017-06-18
  Administered 2017-06-17: 50 mg via INTRAVENOUS
  Administered 2017-06-17: 25 mg via INTRAVENOUS
  Administered 2017-06-18: 50 mg via INTRAVENOUS
  Administered 2017-06-18: 25 mg via INTRAVENOUS

## 2017-06-17 MED ORDER — LACTATED RINGERS IV SOLP
1000 mL | Freq: Once | INTRAVENOUS | 0 refills | Status: DC
Start: 2017-06-17 — End: 2017-06-18

## 2017-06-17 NOTE — H&P (View-Only)
Pre Procedure History and Physical/Sedation Plan    Name:Chad Randall.                                                                   MRN: [id number]                 DOB:1965-08-05          Age: 51 y.o.  Date of Service: 06/17/17    Date of Procedure:  06/17/2017    Planned Procedure(s):  GI:  EGD and Colonoscopy  Sedation/Medication Plan: MAC (Monitored Anesthesia Care)  Discussion/Reviews:  Physician has discussed risks and alternatives of this type of sedation and above planned procedures with patient and parent/legal guardian  ___________________________________________________________________  Chief Complaint:  Reviewed.     History of Present Illness: Chad Randall. is a 51 y.o. male with pelvic floor dysfunction and constipatin and abdominal pain here for EGD and colonoscopy.     Previous Anesthetic/Sedation History: reviewed.     Past Medical History:   Diagnosis Date   ??? ADHD (attention deficit hyperactivity disorder)    ??? Adverse effect     neuroleptic drugs   ??? Autism    ??? Behavior disturbance    ??? Bipolar affective disorder, manic, severe, with psychotic behavior (HCC)    ??? Constipation 10/28/2010   ??? Dystonia    ??? Headache(784.0) 08/26/2006   ??? Heartburn symptom    ??? History of fall     usually associated with urination or getting to feet   ??? Hypothyroidism    ??? Long term use of drug     antipsychotic drugs   ??? Mental retardation    ??? Psychosis (HCC)    ??? Rash     raised red rash, itches on belly, trunk, and underarms   ??? SBO (small bowel obstruction) (HCC)    ??? Schizophrenia (HCC)    ??? Syncope and collapse 08/26/2006   ??? Unspecified disorders of nervous system    ??? Unspecified mental or behavioral problem    ??? WBC decreased      Past Surgical History:   Procedure Laterality Date   ??? HX BUNIONECTOMY     ??? HX TONSIL AND ADENOIDECTOMY     ??? HX VASECTOMY       Pertinent medical/surgical history reviewed  Pertinent family history reviewed  Social History   Substance Use Topics ??? Smoking status: Never Smoker   ??? Smokeless tobacco: Never Used   ??? Alcohol use No     History   Drug Use No     Allergies:  Diphenhydramine-zinc acetate; Haloperidol lactate; Albuterol; Amitriptyline; Antihistamine-1; Atomoxetine; Bee sting [venom-honey bee]; Chlorpheniram-dm-acetaminophen; Clemastine; Clozaril [clozapine]; Depakote [divalproex]; Erythromycin; Flurazepam; Melatonin; Phenylpropanolamine; Pseudoephedrine; Tripelennamine; Triprolidine; and Zolpidem  Medications  Current Facility-Administered Medications   Medication   ??? lactated ringers infusion     Review of Systems:  A 14 point review of systems was negative except for: Gastrointestinal: positive for constipation and abdominal pain           Physical Exam:  Temp: 36.8 ???C (98.2 ???F) (11/19 1441)  Pulse: 78 (11/19 1442)  Respirations: 19 PER MINUTE (11/19 1442)  BP: 132/85 (11/19 1442)  General appearance: alert and combative  Throat:  Lips, mucosa, and tongue normal. Teeth and gums normal  Lungs: clear to auscultation bilaterally  Heart: regular rate and rhythm, S1, S2 normal, no murmur, click, rub or gallop  Abdomen: soft, non-tender. Bowel sounds normal. No masses,  no organomegaly  Extremities: extremities normal, atraumatic, no cyanosis or edema  @  Airway:  airway assessment performed  Mallampati II (soft palate, uvula, fauces visible)  Anesthesia Classification:  ASA III (A patient with a severe systemic disease that limits activity, but is not incapacitating)  NPO Status: Acceptable  Pregnancy Status: N/A    Lab/Radiology/Other Diagnostic Tests  Labs:  Relevant labs reviewed      Bernita Buffy, MD  Pager [phone]

## 2017-06-18 ENCOUNTER — Ambulatory Visit: Admit: 2017-06-17 | Discharge: 2017-06-17 | Payer: MEDICARE

## 2017-06-18 ENCOUNTER — Ambulatory Visit: Admit: 2017-06-17 | Discharge: 2017-06-18 | Payer: MEDICARE

## 2017-06-18 ENCOUNTER — Encounter: Admit: 2017-06-18 | Discharge: 2017-06-18 | Payer: MEDICARE

## 2017-06-18 DIAGNOSIS — F209 Schizophrenia, unspecified: ICD-10-CM

## 2017-06-18 DIAGNOSIS — K644 Residual hemorrhoidal skin tags: ICD-10-CM

## 2017-06-18 DIAGNOSIS — F79 Unspecified intellectual disabilities: ICD-10-CM

## 2017-06-18 DIAGNOSIS — K648 Other hemorrhoids: ICD-10-CM

## 2017-06-18 DIAGNOSIS — E039 Hypothyroidism, unspecified: ICD-10-CM

## 2017-06-18 DIAGNOSIS — F840 Autistic disorder: ICD-10-CM

## 2017-06-18 DIAGNOSIS — R634 Abnormal weight loss: Principal | ICD-10-CM

## 2017-06-18 DIAGNOSIS — R198 Other specified symptoms and signs involving the digestive system and abdomen: ICD-10-CM

## 2017-06-18 NOTE — Progress Notes
Abdominal support provided by Raoshan during colonoscopy per Dr. Khan. Patient tolerating well. Will continue to monitor.

## 2017-06-18 NOTE — Telephone Encounter
Spoke to pt mother who states after colonoscopy was informed pt had retained stool despite completing 2 day 2 gallon Golytely prep, states pt was also on soft diet for 1 week prior to procedure.     Pt continues to take Linzess 290 qd. Mother denies current obstructive sx for pt but is concerned at retained stool during procedure.     Routing to Dr. Jomarie LongsGrisolano

## 2017-06-18 NOTE — Anesthesia Post-Procedure Evaluation
Post-Anesthesia Evaluation    Name: Chad HellingWilliam J Everage Jr.      MRN: [id number]     DOB: 11/16/1965     Age: 51 y.o.     Sex: male   __________________________________________________________________________     Procedure Date: 06/17/2017  Procedure: Procedure(s):  ESOPHAGOGASTRODUODENOSCOPY ENDOSCOPIC ULTRASOUND [facility]  COLONOSCOPY & EGD/EUS  ESOPHAGOGASTRODUODENOSCOPY BIOPSY  COLONOSCOPY BIOPSY      Surgeon: Surgeon(s):  Bernita Buffylyaee, Mojtaba, MD  Anette GuarneriKhan, Taimur, MD    Post-Anesthesia Vitals  BP: 130/83 (11/19 1921)  Temp: 36.4 C (97.5 F) (11/19 1904)  Pulse: 78 (11/19 1921)  SpO2: 99 % (11/19 1921)  SpO2 Pulse: 82 (11/19 1921)      Post Anesthesia Evaluation Note    Evaluation location: Pre/Post  Patient participation: recovered; patient participated in evaluation  Level of consciousness: alert    Pain score: 0  Pain management: adequate    Hydration: normovolemia  Temperature: 36.0C - 38.4C  Airway patency: adequate    Perioperative Events  Perioperative events:  no       Post-op nausea and vomiting: no PONV    Postoperative Status  Cardiovascular status: hemodynamically stable  Respiratory status: spontaneous ventilation  Follow-up needed: none        Perioperative Events  Perioperative Event: No  Emergency Case Activation: No

## 2017-06-18 NOTE — Telephone Encounter
Pts mother called questioning how to clean out blockage at the small bowel after bowel prep for procedures yesterday.     Attempted to call patient back, left detailed message with request to call back.

## 2017-06-18 NOTE — Discharge Planning (AHS/AVS)
EGD/Upper EUS/ERCP/Antegrade Enteroscopy  Post Upper Endoscopy Instructions      -Nothing to eat or drink for 1.5 hours after your procedure if you have had the numbing gargle or spray.  Start with small sips of water at _____________.  If tolerated well, you may advance your diet as tolerated or directed by your physician.      -You may have a sore throat after the procedure for 2-3 days.  Try sucrets or lozenges to help ease the pain.  If it continues please contact us.    -If you feel feverish, have a temperature of 101 degrees or higher, persistent nausea and vomiting, abdominal pain or dark stools; please notify your nurse or GI physician.    -You may have abdominal cramping following the procedure this can be relieved by belching or passing air.    -If you have redness or swelling at the IV site, place a warm, wet washcloth over the affected areas for 15 minutes, 3-4 times a day until the redness subsides.  If symptoms continue for 2-3 days, contact your regular physician.    - If you have bleeding from your mouth, over 2 tablespoons and increasing, please notify your physician.  A small amount of bleeding is normal if a biopsy or polyps were taken.  If you are vomiting blood you need to seek immediate medical attention.    - You may resume all your routine medications, if medications need to be held your physician and/or nurse will notify you post procedure.    SPECIFIC INSTRUCTIONS      OUTPATIENTS:  A. Because of sedation and lack of coordination, UNTIL TOMORROW, DO NOT:  1. Operate any motorized vehicle - this includes driving.  2. Sign any legal documents or conduct important business matters.  3. Use any dangerous machinery (chain saw, lawnmower, etc.).  4. Drink any alcoholic beverages.  Should you have any questions or concerns after your procedure please call [phone] M-F 8am-5:00 pm. After 5:00 pm, holidays or weekends call [phone] and ask for the GI Doctor on call. Colon/Lower EUS/Retrograde Enteroscopy     Post Lower Endoscopy Instructions    -If you feel feverish, have a temperature of 101 degrees or higher, persistent nausea and vomiting, abdominal pain or dark stools; please notify your nurse or GI physician.    -You may have abdominal cramping following the procedure this can be relieved by belching or passing air.    -If you have redness or swelling at the IV site, place a warm, wet washcloth over the affected areas for 15 minutes, 3-4 times a day until the redness subsides.  If symptoms continue for 2-3 days, contact your regular physician.    - If you have bleeding from your bowels over 2 tablespoons and increasing, please notify your physician.  A small amount of bleeding is normal if a biopsy or polyps were taken.    - You may resume all your routine medications, if medications need to be held your physician and/or nurse will notify you post procedure.    SPECIFIC INSTRUCTIONS  I    OUTPATIENTS:  B. Because of sedation and lack of coordination, UNTIL TOMORROW, DO NOT:  5. Operate any motorized vehicle - this includes driving.  6. Sign any legal documents or conduct important business matters.  7. Use any dangerous machinery (chain saw, lawnmower, etc.).  8. Drink any alcoholic beverages.  Should you have any questions or concerns after your procedure please call [phone] M-F 8am-5:00 pm.  After 5:00 pm, holidays or weekends call [phone] and ask for the GI Doctor on call.

## 2017-06-19 NOTE — Telephone Encounter
Likely due to his constipation, slower transit.

## 2017-06-19 NOTE — Telephone Encounter
Talked with pts mom regarding Dr. Edison SimonGrisolano's comments. Pt voices understanding & denies questions.

## 2017-06-24 ENCOUNTER — Encounter: Admit: 2017-06-24 | Discharge: 2017-06-24 | Payer: MEDICARE

## 2017-06-24 NOTE — Telephone Encounter
Received call from pt mother stating they had to reschedule OV scheduled for today due to snowy roads. Pt is rescheduled for 06/27/17    Mother inquiring regarding pathology results from EGD/Colon. Routing to Dr. Jomarie LongsGrisolano to advise on pathology.

## 2017-06-25 ENCOUNTER — Encounter: Admit: 2017-06-25 | Discharge: 2017-06-25 | Payer: MEDICARE

## 2017-06-25 DIAGNOSIS — R21 Rash and other nonspecific skin eruption: ICD-10-CM

## 2017-06-25 DIAGNOSIS — G988 Other disorders of nervous system: Principal | ICD-10-CM

## 2017-06-25 DIAGNOSIS — D72819 Decreased white blood cell count, unspecified: ICD-10-CM

## 2017-06-25 DIAGNOSIS — R12 Heartburn: ICD-10-CM

## 2017-06-25 DIAGNOSIS — K5900 Constipation, unspecified: ICD-10-CM

## 2017-06-25 DIAGNOSIS — E039 Hypothyroidism, unspecified: ICD-10-CM

## 2017-06-25 DIAGNOSIS — F909 Attention-deficit hyperactivity disorder, unspecified type: ICD-10-CM

## 2017-06-25 DIAGNOSIS — R55 Syncope and collapse: ICD-10-CM

## 2017-06-25 DIAGNOSIS — F312 Bipolar disorder, current episode manic severe with psychotic features: ICD-10-CM

## 2017-06-25 DIAGNOSIS — Z9181 History of falling: ICD-10-CM

## 2017-06-25 DIAGNOSIS — Z79899 Other long term (current) drug therapy: ICD-10-CM

## 2017-06-25 DIAGNOSIS — K56609 Unspecified intestinal obstruction, unspecified as to partial versus complete obstruction: ICD-10-CM

## 2017-06-25 DIAGNOSIS — F919 Conduct disorder, unspecified: ICD-10-CM

## 2017-06-25 DIAGNOSIS — F79 Unspecified intellectual disabilities: ICD-10-CM

## 2017-06-25 DIAGNOSIS — IMO0002 Unspecified mental or behavioral problem: ICD-10-CM

## 2017-06-25 DIAGNOSIS — F840 Autistic disorder: ICD-10-CM

## 2017-06-25 DIAGNOSIS — G249 Dystonia, unspecified: ICD-10-CM

## 2017-06-25 DIAGNOSIS — F209 Schizophrenia, unspecified: ICD-10-CM

## 2017-06-25 DIAGNOSIS — F29 Unspecified psychosis not due to a substance or known physiological condition: ICD-10-CM

## 2017-06-25 DIAGNOSIS — R51 Headache: ICD-10-CM

## 2017-06-25 NOTE — Telephone Encounter
A. Duodenal mucosa, "duodenal biopsy", biopsy:    Normal villous architecture with no diagnostic abnormalities.      B. 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     C. Colonic mucosa, "random biopsy ", biopsy:    No diagnostic abnormalities.      Needs RX for H pylori    amoxicillan 1 gram BID  Biaxin 500 mg BID  PPI BID     Take all for 2 weeks

## 2017-06-25 NOTE — Telephone Encounter
Attempted to reach pt regarding biopsy results. Requested pt to call back for results.

## 2017-06-27 ENCOUNTER — Ambulatory Visit: Admit: 2017-06-27 | Discharge: 2017-06-27 | Payer: MEDICARE

## 2017-06-27 ENCOUNTER — Encounter: Admit: 2017-06-27 | Discharge: 2017-06-27 | Payer: MEDICARE

## 2017-06-27 ENCOUNTER — Ambulatory Visit: Admit: 2017-06-27 | Discharge: 2017-06-28 | Payer: MEDICARE

## 2017-06-27 DIAGNOSIS — R55 Syncope and collapse: ICD-10-CM

## 2017-06-27 DIAGNOSIS — G988 Other disorders of nervous system: Principal | ICD-10-CM

## 2017-06-27 DIAGNOSIS — D72819 Decreased white blood cell count, unspecified: ICD-10-CM

## 2017-06-27 DIAGNOSIS — Z9181 History of falling: ICD-10-CM

## 2017-06-27 DIAGNOSIS — E039 Hypothyroidism, unspecified: ICD-10-CM

## 2017-06-27 DIAGNOSIS — F29 Unspecified psychosis not due to a substance or known physiological condition: ICD-10-CM

## 2017-06-27 DIAGNOSIS — R12 Heartburn: ICD-10-CM

## 2017-06-27 DIAGNOSIS — R51 Headache: ICD-10-CM

## 2017-06-27 DIAGNOSIS — K5909 Other constipation: ICD-10-CM

## 2017-06-27 DIAGNOSIS — K5900 Constipation, unspecified: ICD-10-CM

## 2017-06-27 DIAGNOSIS — G249 Dystonia, unspecified: ICD-10-CM

## 2017-06-27 DIAGNOSIS — K56609 Unspecified intestinal obstruction, unspecified as to partial versus complete obstruction: ICD-10-CM

## 2017-06-27 DIAGNOSIS — F909 Attention-deficit hyperactivity disorder, unspecified type: ICD-10-CM

## 2017-06-27 DIAGNOSIS — F312 Bipolar disorder, current episode manic severe with psychotic features: ICD-10-CM

## 2017-06-27 DIAGNOSIS — F840 Autistic disorder: ICD-10-CM

## 2017-06-27 DIAGNOSIS — F919 Conduct disorder, unspecified: ICD-10-CM

## 2017-06-27 DIAGNOSIS — F79 Unspecified intellectual disabilities: ICD-10-CM

## 2017-06-27 DIAGNOSIS — R634 Abnormal weight loss: Principal | ICD-10-CM

## 2017-06-27 DIAGNOSIS — K2970 Gastritis, unspecified, without bleeding: ICD-10-CM

## 2017-06-27 DIAGNOSIS — F209 Schizophrenia, unspecified: ICD-10-CM

## 2017-06-27 DIAGNOSIS — Z79899 Other long term (current) drug therapy: ICD-10-CM

## 2017-06-27 DIAGNOSIS — IMO0002 Unspecified mental or behavioral problem: ICD-10-CM

## 2017-06-27 DIAGNOSIS — R21 Rash and other nonspecific skin eruption: ICD-10-CM

## 2017-06-27 MED ORDER — CLARITHROMYCIN 500 MG PO TAB
500 mg | ORAL_TABLET | Freq: Two times a day (BID) | ORAL | 0 refills | Status: AC
Start: 2017-06-27 — End: ?

## 2017-06-27 MED ORDER — AMOXICILLIN 500 MG PO CAP
1000 mg | ORAL_CAPSULE | Freq: Two times a day (BID) | ORAL | 0 refills | 7.00000 days | Status: AC
Start: 2017-06-27 — End: ?

## 2017-06-27 MED ORDER — OMEPRAZOLE 20 MG PO CPDR
20 mg | ORAL_CAPSULE | Freq: Every day | ORAL | 0 refills | Status: AC
Start: 2017-06-27 — End: 2017-06-28

## 2017-06-27 NOTE — Progress Notes
Date of Service: 06/27/2017    Subjective:             Chad Randall. is a 51 y.o. male.    History of Present Illness    Chad Randall is a very pleasant 51 yo Caucasian male who has a long-standing history of constipation and pelvic floor dysfunction. He is a weill established patient of Dr. Jomarie Longs.  His most recent office visit with Dr. Jomarie Longs in 07/2016.  He had recently been seen in a local emergency room for worsening constipation. CT imaging revealed no evidence of bowel obstruction and was essentially normal.  He was advised to drink a half a bowel prep which led to a large bowel movement and some symptom improvement.  His regular bowel regimen included Colace 2 tabs twice a day, Dulcolax 2 tabs twice a day, double dose MiraLAX twice a day, and Linzess 290 mcg daily.  Concern at the office visit with Dr. Jomarie Longs was weight loss.  He was still eating 3 meals a day but at times did not feel hungry.  There was some occasional nausea but no vomiting.  He was advised to continue this current bowel regimen.  Lab work was also checked on 08/04/16 which included normal AM cortisol, TSH, and negative HIV and hepatitis profile.  Dr. Jomarie Longs also recommended EGD and colonoscopy for further evaluation of his symptoms which did not get performed until recently. EUS was also performed.     He presents today with his mom Chad Randall for follow-up office visit.  We discussed test results in detail.  EGD on 06/17/17 revealed normal-appearing esophagus, stomach, and duodenum.  Duodenal biopsies were negative for celiac sprue.  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  He has yet to start antibiotic therapy. EUS on 06/17/17 was normal including no evidence of pancreatic mass or chronic pancreatitis.  Colonoscopy on 06/17/17 revealed a fair bowel prep, examined portion of the ileum was normal.  Stool noted in the ascending colon although colonic mucosa in the entire colon appeared normal. Nonbleeding external and internal hemorrhoids were noted.  Random biopsies were negative for microscopic colitis.  Given fair prep repeat colonoscopy recommended in 1 year.    Currently, he continues to lose weight.  He has lost about 5 pounds since he saw Dr. Jomarie Longs in 07/2016.  Of note, his documented weight when I saw him in 09/2015 was 159 pounds.  Despite the weight loss, his mother Chad Randall says he is eating very good meals with increased portion sizes and supplementing with a protein shake daily.  May be occasional nausea but no vomiting.  No recent issues with increased constipation.  No reported rectal bleeding or melena. Chad Randall does report intermittent periumbilical abdominal pain but he has difficulty being more specific. He remains on his typical bowel regimen of Colace 2 tabs twice a day, Dulcolax 2 tabs twice a day, double dose MiraLAX twice a day, and Linzess 290 mcg daily. They will then use an enema and 2 bottles of magnesium citrate, which generally results in good evacuation if there is suspicion of constipation. He denies dysphagia or odynophagia.       Colonoscopy 02/13/11:???  Very long and redundant colon; normal mucosa in all segments. Hemorrhoids were found.   ???  PAST MEDICAL HISTORY:   1. 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 Pelvic floor testing with anorectal manometry was performed. Given Chad Randall's mental capacity this was somewhat limited testing and was felt to be inconclusive.  Sitz marker study was performed. Day five x-ray revealed 24 markers spread throughout the colon.   2. Autism.  3. Bipolar disorder.  4. Heartburn.  5. Hypothyroidism, on replacement therapy.  6. History small bowel obstruction February 2015.  7. Normal CT A/P 08/07/16.  8. Normal colonoscopy 02/13/11.  ???  FAMILY HISTORY:??????No colon cancer in family.           Review of Systems   Constitutional: Negative.  Negative for appetite change, chills, diaphoresis, fatigue, fever and unexpected weight change.   HENT: Positive for rhinorrhea and sneezing. Negative for mouth sores, sore throat, trouble swallowing and voice change.    Eyes: Negative.  Negative for pain and visual disturbance.   Respiratory: Negative.  Negative for choking.    Cardiovascular: Positive for palpitations. Negative for chest pain and leg swelling.   Gastrointestinal: Positive for constipation, diarrhea, nausea and rectal pain. Negative for abdominal distention, abdominal pain, anal bleeding, blood in stool and vomiting.   Genitourinary: Positive for difficulty urinating, dysuria and enuresis. Negative for flank pain.   Musculoskeletal: Negative.  Negative for arthralgias and back pain.   Skin: Negative.  Negative for rash.   Allergic/Immunologic: Negative.    Neurological: Negative.  Negative for light-headedness and headaches.   Hematological: Negative.  Negative for adenopathy.   Psychiatric/Behavioral: Positive for agitation, behavioral problems, confusion, decreased concentration, dysphoric mood, hallucinations and sleep disturbance. The patient is nervous/anxious and is hyperactive.    All other systems reviewed and are negative.        Objective:         ??? aspirin EC 81 mg tablet Take 81 mg by mouth daily. Take with food.   ??? atorvastatin (LIPITOR) 20 mg tablet Take 20 mg by mouth at bedtime daily.   ??? bisacodyl (DULCOLAX) 5 mg tablet Take 1 Tab by mouth twice daily.   ??? clonazePAM (KLONOPIN) 1 mg tablet Take one tablet by mouth daily.   ??? DOCOSAHEXANOIC ACID/EPA (FISH OIL PO) Take 2 Caps by mouth daily.   ??? docusate (COLACE) 100 mg capsule Take 100 mg by mouth twice daily.   ??? electrolyte GUT PEG (NULYTELY, COLYTE, GAVILYTE-N) 420 gram oral solution Per your 2 day colonoscopy prep sheet   ??? electrolyte GUT PEG (NULYTELY, COLYTE, GAVILYTE-N) 420 gram oral solution 2 day split dose   ??? glucosamine(+) 500 mg tab Take 500 mg by mouth daily. ??? lamoTRIgine (LAMICTAL) 100 mg tablet Take 1/2 tab in the morning and 1 tab at night for mood   ??? levothyroxine (SYNTHROID) 88 mcg tablet Take 88 mcg by mouth daily.   ??? linaclotide(+) (LINZESS) 290 mcg capsule Take 1 capsule by mouth daily 30 minutes before breakfast.   ??? lithium carbonate (ESKALITH) 150 mg capsule Take 2 capsules by mouth every morning and 3 caps in the evening. Take with food.   ??? magnesium citrate oral solution Take 296 mL by mouth as Needed.   ??? magnesium 400 mEq.   ??? OLANZapine (ZYPREXA ZYDIS) 5 mg rapid dissolve tablet Dissolve one tablet by mouth daily as needed.   ??? OLANZapine (ZYPREXA) 10 mg tablet Take 1 tablet by mouth daily between noon and 2pm   ??? OLANZapine (ZYPREXA) 20 mg tablet Take one tablet by mouth at bedtime daily.   ??? OLANZapine (ZYPREXA) 5 mg tablet Take one tablet by mouth every morning.   ??? polyethylene glycol 3350 (GLYCOLAX; MIRALAX) 17 gram/dose powder Take 17 g by mouth twice daily.   ??? QUEtiapine (SEROQUEL) 100  mg tablet Take one tab am (with 300mg  for total dose 400mg ) and one tab at 2pm (with 300mg  for total dose 400mg )   ??? QUEtiapine (SEROQUEL) 300 mg tablet Take one tab am (with 100mg  tab for total dose 400mg ), one tab at 2pm (with 100mg  tab for total dose 400mg ), and two tabs at HS   ??? senna (SENOKOT) 8.6 mg tablet Take 2 Tabs by mouth twice daily.   ??? sorbitol 70 % solution Take 15 mL by mouth daily as needed. constipation   ??? temazepam (RESTORIL) 15 mg capsule Take one capsule by mouth at bedtime daily.   ??? temazepam (RESTORIL) 30 mg capsule Take one capsule by mouth at bedtime daily.     Vitals:    06/27/17 1431   BP: 138/90   Pulse: 80   Temp: 36.1 ???C (97 ???F)   SpO2: 97%   Weight: 58.5 kg (129 lb)   Height: 167.6 cm (66)     Body mass index is 20.82 kg/m???.     Physical Exam   Constitutional: He is oriented to person, place, and time. He appears well-developed and well-nourished. No distress.   HENT:   Head: Normocephalic and atraumatic. Mouth/Throat: Oropharynx is clear and moist.   Eyes: Pupils are equal, round, and reactive to light. Conjunctivae are normal. Right eye exhibits no discharge. Left eye exhibits no discharge. No scleral icterus.   Neck: Normal range of motion. Neck supple. No thyromegaly present.   Cardiovascular: Normal rate, regular rhythm, normal heart sounds and intact distal pulses.    Pulmonary/Chest: Effort normal and breath sounds normal. No respiratory distress. He has no wheezes.   Abdominal: Soft. Bowel sounds are normal. He exhibits no distension and no mass. There is tenderness. There is no rebound and no guarding.   mild periumbilical tenderness, soft, no distention.   Musculoskeletal: Normal range of motion. He exhibits no edema.   Lymphadenopathy:     He has no cervical adenopathy.   Neurological: He is alert and oriented to person, place, and time. Coordination normal.   Skin: Skin is warm and dry. No rash noted. He is not diaphoretic.   Psychiatric: He has a normal mood and affect. His behavior is normal. Judgment and thought content normal.   Vitals reviewed.           Assessment and Plan:    1. 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 Currently doing fairly well on current bowel regimen.   2. Weight loss. Has lost 5# since 07/2016 OV, previously weighed 159# at time of 09/2015 OV. No clear explanation of etiology so far -has had normal a.m. cortisol and TSH, negative hepatitis profile and HIV, negative CT scan, and unremarkable overall EGD, EUS, and colonoscopy 06/17/2017 except for biopsies + for H. pylori.   3. Heartburn, controlled on daily proton pump inhibitor.    Plan:     1.  Because of his weight loss is still unclear.  Will check lab again today which will include CBC, CMP, TSH, random cortisol, and lithium level.  We will call his mom Chad Randall with results.  2.  Will order CT of the chest to further evaluate weight loss. 3.  We will treat H. pylori regimen with amoxicillin 1 g twice daily, Biaxin 500 mg twice daily, and twice daily PPI for 14 days.  His mother is to call once this regimen is completed.  They want to consider checking for eradication of H. pylori with stool testing  at some point.    4.   He will continue his current bowel regimen. Go to ER if obstructive type symptoms develop.   5.   We will have him return for follow-up in 4-6 weeks, call or return sooner if needed.  6.    Monitor weight at home, continue frequent meals and nutritional supplements.     The plan of care was discussed with Dr. Jomarie Longs while patient in clinic.  Patient and his mother were advised of the plan and voiced agreement and understanding.     Total face to face time spent with patient: 30 minutes with >50% of that time spent counseling the patient on medications, prior study results related to symptoms, differential diagnosis, and options regarding the plan of care.      Thank you for allowing me to see your patient in the office today. Please feel free to contact me if you have any questions or concerns.      This note was in part completed with Dragon, a voice to text dictation system. Some errors may have occured and persist despite my best efforts to edit this document to eliminate dictation/translation???related errors.??? If you have questions/concerns, please contact me for clarification      ???

## 2017-06-28 ENCOUNTER — Ambulatory Visit: Admit: 2017-06-28 | Discharge: 2017-06-28 | Payer: MEDICARE

## 2017-06-28 ENCOUNTER — Encounter: Admit: 2017-06-28 | Discharge: 2017-06-28 | Payer: MEDICARE

## 2017-06-28 DIAGNOSIS — IMO0002 Unspecified mental or behavioral problem: ICD-10-CM

## 2017-06-28 DIAGNOSIS — F29 Unspecified psychosis not due to a substance or known physiological condition: ICD-10-CM

## 2017-06-28 DIAGNOSIS — D72819 Decreased white blood cell count, unspecified: ICD-10-CM

## 2017-06-28 DIAGNOSIS — R12 Heartburn: ICD-10-CM

## 2017-06-28 DIAGNOSIS — F909 Attention-deficit hyperactivity disorder, unspecified type: ICD-10-CM

## 2017-06-28 DIAGNOSIS — A048 Other specified bacterial intestinal infections: ICD-10-CM

## 2017-06-28 DIAGNOSIS — F312 Bipolar disorder, current episode manic severe with psychotic features: ICD-10-CM

## 2017-06-28 DIAGNOSIS — Z79899 Other long term (current) drug therapy: ICD-10-CM

## 2017-06-28 DIAGNOSIS — R55 Syncope and collapse: ICD-10-CM

## 2017-06-28 DIAGNOSIS — G988 Other disorders of nervous system: Principal | ICD-10-CM

## 2017-06-28 DIAGNOSIS — R21 Rash and other nonspecific skin eruption: ICD-10-CM

## 2017-06-28 DIAGNOSIS — F919 Conduct disorder, unspecified: ICD-10-CM

## 2017-06-28 DIAGNOSIS — F209 Schizophrenia, unspecified: ICD-10-CM

## 2017-06-28 DIAGNOSIS — K56609 Unspecified intestinal obstruction, unspecified as to partial versus complete obstruction: ICD-10-CM

## 2017-06-28 DIAGNOSIS — F840 Autistic disorder: ICD-10-CM

## 2017-06-28 DIAGNOSIS — K5900 Constipation, unspecified: ICD-10-CM

## 2017-06-28 DIAGNOSIS — E039 Hypothyroidism, unspecified: ICD-10-CM

## 2017-06-28 DIAGNOSIS — R634 Abnormal weight loss: Principal | ICD-10-CM

## 2017-06-28 DIAGNOSIS — R51 Headache: ICD-10-CM

## 2017-06-28 DIAGNOSIS — Z9181 History of falling: ICD-10-CM

## 2017-06-28 DIAGNOSIS — F79 Unspecified intellectual disabilities: ICD-10-CM

## 2017-06-28 DIAGNOSIS — G249 Dystonia, unspecified: ICD-10-CM

## 2017-06-28 LAB — COMPREHENSIVE METABOLIC PANEL
Lab: 1.1 mg/dL (ref 0.4–1.24)
Lab: 10 mg/dL (ref 8.5–10.6)
Lab: 105 MMOL/L — ABNORMAL LOW (ref 98–110)
Lab: 107 mg/dL — ABNORMAL HIGH (ref >60)
Lab: 140 MMOL/L — ABNORMAL LOW (ref 137–147)

## 2017-06-28 LAB — TSH WITH FREE T4 REFLEX: Lab: 1.8 uU/mL — ABNORMAL HIGH (ref >60)

## 2017-06-28 LAB — CBC: Lab: 4.4 K/UL — ABNORMAL LOW (ref 4.5–11.0)

## 2017-06-28 LAB — CORTISOL,RANDOM: Lab: 7.9 ug/dL (ref 5.0–20.0)

## 2017-06-28 MED ORDER — IOHEXOL 350 MG IODINE/ML IV SOLN
75 mL | Freq: Once | INTRAVENOUS | 0 refills | Status: CP
Start: 2017-06-28 — End: ?

## 2017-06-28 MED ORDER — SODIUM CHLORIDE 0.9 % IJ SOLN
50 mL | Freq: Once | INTRAVENOUS | 0 refills | Status: CP
Start: 2017-06-28 — End: ?
  Administered 2017-06-28: 19:00:00 50 mL via INTRAVENOUS

## 2017-06-28 MED ORDER — OMEPRAZOLE 20 MG PO CPDR
20 mg | ORAL_CAPSULE | Freq: Two times a day (BID) | ORAL | 0 refills | Status: AC
Start: 2017-06-28 — End: ?

## 2017-06-28 NOTE — Telephone Encounter
Pt is on treatment for H pylori per Cherene AltesUtech NP

## 2017-07-03 ENCOUNTER — Encounter: Admit: 2017-07-03 | Discharge: 2017-07-03 | Payer: MEDICARE

## 2017-07-03 DIAGNOSIS — R634 Abnormal weight loss: Principal | ICD-10-CM

## 2017-07-03 MED ORDER — SODIUM CHLORIDE 0.9 % IV SOLP
INTRAVENOUS | 0 refills | Status: CN
Start: 2017-07-03 — End: ?

## 2017-07-03 NOTE — Telephone Encounter
Spoke to pt mother and relayed recommendations; pt mother verbalized understanding and is agreeable to stool test and H2BT. Prep instructions sent to pt via MyChart.

## 2017-07-03 NOTE — Telephone Encounter
-----   Message from Volney AmericanLora Utech, [place] sent at 07/01/2017  3:20 PM CST -----  Call mother Kathie RhodesBetty. Good news - CT chest normal. Discussed with Dr. Jomarie LongsGrisolano. Order  Hydrogen breath test to r/o small intestinal bacterial overgrowth and check fecal elastase. If negative, extensive GI workup unremarkable, will need to follow up with PCP to see if there is a non GI related problem causing weight loss.

## 2017-07-04 ENCOUNTER — Encounter: Admit: 2017-07-04 | Discharge: 2017-07-04 | Payer: MEDICARE

## 2017-07-04 MED ORDER — CLONAZEPAM 1 MG PO TAB
1 mg | ORAL_TABLET | Freq: Every day | ORAL | 0 refills | Status: AC
Start: 2017-07-04 — End: 2017-07-31

## 2017-07-05 ENCOUNTER — Ambulatory Visit: Admit: 2017-07-05 | Discharge: 2017-07-05 | Payer: MEDICARE

## 2017-07-31 ENCOUNTER — Encounter: Admit: 2017-07-31 | Discharge: 2017-07-31 | Payer: MEDICARE

## 2017-07-31 ENCOUNTER — Ambulatory Visit: Admit: 2017-07-31 | Discharge: 2017-07-31 | Payer: MEDICARE

## 2017-07-31 DIAGNOSIS — A048 Other specified bacterial intestinal infections: ICD-10-CM

## 2017-07-31 DIAGNOSIS — D72819 Decreased white blood cell count, unspecified: ICD-10-CM

## 2017-07-31 DIAGNOSIS — F909 Attention-deficit hyperactivity disorder, unspecified type: ICD-10-CM

## 2017-07-31 DIAGNOSIS — F29 Unspecified psychosis not due to a substance or known physiological condition: ICD-10-CM

## 2017-07-31 DIAGNOSIS — Z9181 History of falling: ICD-10-CM

## 2017-07-31 DIAGNOSIS — F79 Unspecified intellectual disabilities: ICD-10-CM

## 2017-07-31 DIAGNOSIS — F312 Bipolar disorder, current episode manic severe with psychotic features: ICD-10-CM

## 2017-07-31 DIAGNOSIS — F840 Autistic disorder: ICD-10-CM

## 2017-07-31 DIAGNOSIS — F250 Schizoaffective disorder, bipolar type: Principal | ICD-10-CM

## 2017-07-31 DIAGNOSIS — R12 Heartburn: ICD-10-CM

## 2017-07-31 DIAGNOSIS — G988 Other disorders of nervous system: Principal | ICD-10-CM

## 2017-07-31 DIAGNOSIS — R51 Headache: ICD-10-CM

## 2017-07-31 DIAGNOSIS — F902 Attention-deficit hyperactivity disorder, combined type: ICD-10-CM

## 2017-07-31 DIAGNOSIS — E039 Hypothyroidism, unspecified: ICD-10-CM

## 2017-07-31 DIAGNOSIS — F919 Conduct disorder, unspecified: ICD-10-CM

## 2017-07-31 DIAGNOSIS — Z79899 Other long term (current) drug therapy: ICD-10-CM

## 2017-07-31 DIAGNOSIS — R21 Rash and other nonspecific skin eruption: ICD-10-CM

## 2017-07-31 DIAGNOSIS — R55 Syncope and collapse: ICD-10-CM

## 2017-07-31 DIAGNOSIS — K5900 Constipation, unspecified: ICD-10-CM

## 2017-07-31 DIAGNOSIS — IMO0002 Unspecified mental or behavioral problem: ICD-10-CM

## 2017-07-31 DIAGNOSIS — K56609 Unspecified intestinal obstruction, unspecified as to partial versus complete obstruction: ICD-10-CM

## 2017-07-31 DIAGNOSIS — G249 Dystonia, unspecified: ICD-10-CM

## 2017-07-31 DIAGNOSIS — F209 Schizophrenia, unspecified: ICD-10-CM

## 2017-07-31 MED ORDER — LITHIUM CARBONATE 150 MG PO CAP
ORAL_CAPSULE | ORAL | 1 refills | 90.00000 days | Status: AC
Start: 2017-07-31 — End: 2017-10-30

## 2017-07-31 MED ORDER — TEMAZEPAM 15 MG PO CAP
15 mg | ORAL_CAPSULE | Freq: Every evening | ORAL | 1 refills | Status: AC
Start: 2017-07-31 — End: 2017-10-30

## 2017-07-31 MED ORDER — QUETIAPINE 100 MG PO TAB
ORAL_TABLET | 2 refills | Status: AC
Start: 2017-07-31 — End: 2017-10-30

## 2017-07-31 MED ORDER — LAMOTRIGINE 100 MG PO TAB
100 mg | ORAL_TABLET | Freq: Two times a day (BID) | ORAL | 1 refills | Status: AC
Start: 2017-07-31 — End: 2017-10-30

## 2017-07-31 MED ORDER — OLANZAPINE 5 MG PO TBDI
5 mg | ORAL_TABLET | Freq: Every day | ORAL | 1 refills | 30.00000 days | Status: AC | PRN
Start: 2017-07-31 — End: 2017-10-30

## 2017-07-31 MED ORDER — OLANZAPINE 20 MG PO TAB
20 mg | ORAL_TABLET | Freq: Every evening | ORAL | 1 refills | 30.00000 days | Status: AC
Start: 2017-07-31 — End: 2017-10-30

## 2017-07-31 MED ORDER — QUETIAPINE 300 MG PO TAB
ORAL_TABLET | 2 refills | Status: AC
Start: 2017-07-31 — End: 2017-10-30

## 2017-07-31 MED ORDER — OLANZAPINE 10 MG PO TAB
ORAL_TABLET | Freq: Every day | ORAL | 1 refills | 30.00000 days | Status: AC
Start: 2017-07-31 — End: 2017-10-30

## 2017-07-31 MED ORDER — OLANZAPINE 5 MG PO TAB
5 mg | ORAL_TABLET | Freq: Every morning | ORAL | 1 refills | 30.00000 days | Status: AC
Start: 2017-07-31 — End: 2017-10-30

## 2017-07-31 MED ORDER — TEMAZEPAM 30 MG PO CAP
30 mg | ORAL_CAPSULE | Freq: Every evening | ORAL | 1 refills | Status: AC
Start: 2017-07-31 — End: 2017-10-30

## 2017-07-31 MED ORDER — CLONAZEPAM 1 MG PO TAB
1 mg | ORAL_TABLET | Freq: Every day | ORAL | 0 refills | Status: AC
Start: 2017-07-31 — End: 2017-10-09

## 2017-08-13 ENCOUNTER — Encounter: Admit: 2017-08-13 | Discharge: 2017-08-13 | Payer: MEDICARE

## 2017-08-14 ENCOUNTER — Ambulatory Visit: Admit: 2017-08-14 | Discharge: 2017-08-14 | Payer: MEDICARE

## 2017-08-14 ENCOUNTER — Encounter: Admit: 2017-08-14 | Discharge: 2017-08-14 | Payer: MEDICARE

## 2017-08-14 DIAGNOSIS — F909 Attention-deficit hyperactivity disorder, unspecified type: ICD-10-CM

## 2017-08-14 DIAGNOSIS — K5900 Constipation, unspecified: ICD-10-CM

## 2017-08-14 DIAGNOSIS — Z79899 Other long term (current) drug therapy: ICD-10-CM

## 2017-08-14 DIAGNOSIS — G988 Other disorders of nervous system: Principal | ICD-10-CM

## 2017-08-14 DIAGNOSIS — Z9181 History of falling: ICD-10-CM

## 2017-08-14 DIAGNOSIS — F209 Schizophrenia, unspecified: ICD-10-CM

## 2017-08-14 DIAGNOSIS — R55 Syncope and collapse: ICD-10-CM

## 2017-08-14 DIAGNOSIS — F840 Autistic disorder: ICD-10-CM

## 2017-08-14 DIAGNOSIS — F29 Unspecified psychosis not due to a substance or known physiological condition: ICD-10-CM

## 2017-08-14 DIAGNOSIS — A048 Other specified bacterial intestinal infections: ICD-10-CM

## 2017-08-14 DIAGNOSIS — G249 Dystonia, unspecified: ICD-10-CM

## 2017-08-14 DIAGNOSIS — K56609 Unspecified intestinal obstruction, unspecified as to partial versus complete obstruction: ICD-10-CM

## 2017-08-14 DIAGNOSIS — F312 Bipolar disorder, current episode manic severe with psychotic features: ICD-10-CM

## 2017-08-14 DIAGNOSIS — D72819 Decreased white blood cell count, unspecified: ICD-10-CM

## 2017-08-14 DIAGNOSIS — F79 Unspecified intellectual disabilities: ICD-10-CM

## 2017-08-14 DIAGNOSIS — IMO0002 Unspecified mental or behavioral problem: ICD-10-CM

## 2017-08-14 DIAGNOSIS — R51 Headache: ICD-10-CM

## 2017-08-14 DIAGNOSIS — R21 Rash and other nonspecific skin eruption: ICD-10-CM

## 2017-08-14 DIAGNOSIS — R12 Heartburn: ICD-10-CM

## 2017-08-14 DIAGNOSIS — E039 Hypothyroidism, unspecified: ICD-10-CM

## 2017-08-14 DIAGNOSIS — F919 Conduct disorder, unspecified: ICD-10-CM

## 2017-08-14 DIAGNOSIS — R634 Abnormal weight loss: Principal | ICD-10-CM

## 2017-08-14 LAB — COMPREHENSIVE METABOLIC PANEL
Lab: 0.9 mg/dL (ref 0.3–1.2)
Lab: 1.3 mg/dL — ABNORMAL HIGH (ref 0.4–1.24)
Lab: 10 mg/dL — ABNORMAL HIGH (ref 8.5–10.6)
Lab: 105 MMOL/L (ref 98–110)
Lab: 105 U/L (ref 25–110)
Lab: 110 mg/dL — ABNORMAL HIGH (ref 70–100)
Lab: 12 U/L (ref 7–56)
Lab: 12 mg/dL (ref 7–25)
Lab: 140 MMOL/L (ref 137–147)
Lab: 17 U/L (ref 7–40)
Lab: 28 MMOL/L (ref 21–30)
Lab: 4.5 MMOL/L (ref 3.5–5.1)
Lab: 4.8 g/dL (ref 3.5–5.0)
Lab: 55 mL/min — ABNORMAL LOW (ref >60)
Lab: 7.3 g/dL (ref 6.0–8.0)
Lab: >60 mL/min (ref >60)
Lab: 7 (ref 3–12)

## 2017-08-14 LAB — LIPID PROFILE
Lab: 113 mg/dL (ref <200)
Lab: 45 mg/dL (ref <100)
Lab: 64 mg/dL (ref <150)

## 2017-08-14 LAB — LITHIUM LEVEL: Lab: 0.8 meq/L (ref >40)

## 2017-08-14 LAB — HEMOGLOBIN A1C: Lab: 5.6 % (ref 4.0–6.0)

## 2017-08-14 MED ORDER — COSYNTROPIN 0.25 MG IJ SOLR
0.25 mg | Freq: Once | INTRAVENOUS | 0 refills | Status: CN
Start: 2017-08-14 — End: ?

## 2017-08-14 MED ORDER — CIPROFLOXACIN HCL 500 MG PO TAB
500 mg | ORAL_TABLET | Freq: Every day | ORAL | 0 refills | 10.00000 days | Status: AC
Start: 2017-08-14 — End: ?

## 2017-08-15 ENCOUNTER — Encounter: Admit: 2017-08-15 | Discharge: 2017-08-15 | Payer: MEDICARE

## 2017-08-16 ENCOUNTER — Encounter: Admit: 2017-08-16 | Discharge: 2017-08-16 | Payer: MEDICARE

## 2017-08-16 LAB — T SPOT TB (QUANTIFERON TB)
Lab: 0
Lab: NEGATIVE

## 2017-08-22 ENCOUNTER — Encounter: Admit: 2017-08-22 | Discharge: 2017-08-22 | Payer: MEDICARE

## 2017-08-22 ENCOUNTER — Ambulatory Visit: Admit: 2017-08-22 | Discharge: 2017-08-23 | Payer: MEDICARE

## 2017-08-22 DIAGNOSIS — K5900 Constipation, unspecified: ICD-10-CM

## 2017-08-22 DIAGNOSIS — F919 Conduct disorder, unspecified: ICD-10-CM

## 2017-08-22 DIAGNOSIS — E039 Hypothyroidism, unspecified: ICD-10-CM

## 2017-08-22 DIAGNOSIS — IMO0002 Unspecified mental or behavioral problem: ICD-10-CM

## 2017-08-22 DIAGNOSIS — D72819 Decreased white blood cell count, unspecified: ICD-10-CM

## 2017-08-22 DIAGNOSIS — G988 Other disorders of nervous system: Principal | ICD-10-CM

## 2017-08-22 DIAGNOSIS — A048 Other specified bacterial intestinal infections: ICD-10-CM

## 2017-08-22 DIAGNOSIS — F209 Schizophrenia, unspecified: ICD-10-CM

## 2017-08-22 DIAGNOSIS — Z79899 Other long term (current) drug therapy: ICD-10-CM

## 2017-08-22 DIAGNOSIS — R12 Heartburn: ICD-10-CM

## 2017-08-22 DIAGNOSIS — F312 Bipolar disorder, current episode manic severe with psychotic features: ICD-10-CM

## 2017-08-22 DIAGNOSIS — F29 Unspecified psychosis not due to a substance or known physiological condition: ICD-10-CM

## 2017-08-22 DIAGNOSIS — R51 Headache: ICD-10-CM

## 2017-08-22 DIAGNOSIS — R21 Rash and other nonspecific skin eruption: ICD-10-CM

## 2017-08-22 DIAGNOSIS — G249 Dystonia, unspecified: ICD-10-CM

## 2017-08-22 DIAGNOSIS — F909 Attention-deficit hyperactivity disorder, unspecified type: ICD-10-CM

## 2017-08-22 DIAGNOSIS — Z9181 History of falling: ICD-10-CM

## 2017-08-22 DIAGNOSIS — F840 Autistic disorder: ICD-10-CM

## 2017-08-22 DIAGNOSIS — F79 Unspecified intellectual disabilities: ICD-10-CM

## 2017-08-22 DIAGNOSIS — K56609 Unspecified intestinal obstruction, unspecified as to partial versus complete obstruction: ICD-10-CM

## 2017-08-22 DIAGNOSIS — R55 Syncope and collapse: ICD-10-CM

## 2017-08-22 LAB — CORTISOL BASELINE: Lab: 17 ug/dL

## 2017-08-22 LAB — CORTISOL 60 MINUTES POST: Lab: 22 ug/dL

## 2017-08-22 LAB — CORTISOL 30 MINUTES POST: Lab: 20 ug/dL

## 2017-08-22 MED ORDER — COSYNTROPIN 0.25 MG IJ SOLR
0.25 mg | Freq: Once | INTRAVENOUS | 0 refills | Status: CN
Start: 2017-08-22 — End: ?

## 2017-08-22 MED ORDER — COSYNTROPIN 0.25 MG IJ SOLR
0.25 mg | Freq: Once | INTRAVENOUS | 0 refills | Status: CP
Start: 2017-08-22 — End: ?
  Administered 2017-08-22: 14:00:00 0.25 mg via INTRAVENOUS

## 2017-08-23 DIAGNOSIS — R634 Abnormal weight loss: Principal | ICD-10-CM

## 2017-08-23 LAB — ACTH: Lab: 36

## 2017-09-12 ENCOUNTER — Encounter: Admit: 2017-09-12 | Discharge: 2017-09-12 | Payer: MEDICARE

## 2017-09-12 ENCOUNTER — Ambulatory Visit: Admit: 2017-09-12 | Discharge: 2017-09-12 | Payer: MEDICARE

## 2017-09-12 ENCOUNTER — Ambulatory Visit: Admit: 2017-09-12 | Discharge: 2017-09-13 | Payer: MEDICARE

## 2017-09-12 DIAGNOSIS — K5900 Constipation, unspecified: ICD-10-CM

## 2017-09-12 DIAGNOSIS — R1084 Generalized abdominal pain: ICD-10-CM

## 2017-09-12 DIAGNOSIS — R634 Abnormal weight loss: Principal | ICD-10-CM

## 2017-09-16 ENCOUNTER — Encounter: Admit: 2017-09-16 | Discharge: 2017-09-16 | Payer: MEDICARE

## 2017-10-09 ENCOUNTER — Encounter: Admit: 2017-10-09 | Discharge: 2017-10-09 | Payer: MEDICARE

## 2017-10-09 MED ORDER — CLONAZEPAM 1 MG PO TAB
1 mg | ORAL_TABLET | Freq: Every day | ORAL | 0 refills | Status: AC
Start: 2017-10-09 — End: 2017-10-30

## 2017-10-30 ENCOUNTER — Ambulatory Visit: Admit: 2017-10-30 | Discharge: 2017-10-30 | Payer: MEDICARE

## 2017-10-30 ENCOUNTER — Encounter: Admit: 2017-10-30 | Discharge: 2017-10-30 | Payer: MEDICARE

## 2017-10-30 DIAGNOSIS — A048 Other specified bacterial intestinal infections: ICD-10-CM

## 2017-10-30 DIAGNOSIS — D72819 Decreased white blood cell count, unspecified: ICD-10-CM

## 2017-10-30 DIAGNOSIS — Z9181 History of falling: ICD-10-CM

## 2017-10-30 DIAGNOSIS — K56609 Unspecified intestinal obstruction, unspecified as to partial versus complete obstruction: ICD-10-CM

## 2017-10-30 DIAGNOSIS — R12 Heartburn: ICD-10-CM

## 2017-10-30 DIAGNOSIS — Z79899 Other long term (current) drug therapy: ICD-10-CM

## 2017-10-30 DIAGNOSIS — R21 Rash and other nonspecific skin eruption: ICD-10-CM

## 2017-10-30 DIAGNOSIS — F250 Schizoaffective disorder, bipolar type: Principal | ICD-10-CM

## 2017-10-30 DIAGNOSIS — E039 Hypothyroidism, unspecified: ICD-10-CM

## 2017-10-30 DIAGNOSIS — F79 Unspecified intellectual disabilities: ICD-10-CM

## 2017-10-30 DIAGNOSIS — R55 Syncope and collapse: ICD-10-CM

## 2017-10-30 DIAGNOSIS — R51 Headache: ICD-10-CM

## 2017-10-30 DIAGNOSIS — G249 Dystonia, unspecified: ICD-10-CM

## 2017-10-30 DIAGNOSIS — F840 Autistic disorder: Principal | ICD-10-CM

## 2017-10-30 DIAGNOSIS — K5900 Constipation, unspecified: ICD-10-CM

## 2017-10-30 DIAGNOSIS — F902 Attention-deficit hyperactivity disorder, combined type: ICD-10-CM

## 2017-10-30 MED ORDER — TEMAZEPAM 30 MG PO CAP
30 mg | ORAL_CAPSULE | Freq: Every evening | ORAL | 1 refills | Status: AC
Start: 2017-10-30 — End: 2018-02-07

## 2017-10-30 MED ORDER — QUETIAPINE 300 MG PO TAB
ORAL_TABLET | 2 refills | Status: AC
Start: 2017-10-30 — End: 2018-02-07

## 2017-10-30 MED ORDER — QUETIAPINE 100 MG PO TAB
ORAL_TABLET | 2 refills | Status: AC
Start: 2017-10-30 — End: 2018-02-07

## 2017-10-30 MED ORDER — LITHIUM CARBONATE 150 MG PO CAP
ORAL_CAPSULE | ORAL | 1 refills | 90.00000 days | Status: AC
Start: 2017-10-30 — End: 2018-02-07

## 2017-10-30 MED ORDER — OLANZAPINE 10 MG PO TAB
ORAL_TABLET | ORAL | 1 refills | 30.00000 days | Status: AC
Start: 2017-10-30 — End: 2018-02-07

## 2017-10-30 MED ORDER — TEMAZEPAM 15 MG PO CAP
15 mg | ORAL_CAPSULE | Freq: Every evening | ORAL | 1 refills | Status: AC
Start: 2017-10-30 — End: 2018-02-07

## 2017-10-30 MED ORDER — OLANZAPINE 20 MG PO TAB
20 mg | ORAL_TABLET | Freq: Every evening | ORAL | 1 refills | 30.00000 days | Status: AC
Start: 2017-10-30 — End: 2018-02-07

## 2017-10-30 MED ORDER — OLANZAPINE 5 MG PO TAB
5 mg | ORAL_TABLET | Freq: Every morning | ORAL | 1 refills | Status: CN
Start: 2017-10-30 — End: ?

## 2017-10-30 MED ORDER — LAMOTRIGINE 100 MG PO TAB
100 mg | ORAL_TABLET | Freq: Two times a day (BID) | ORAL | 1 refills | Status: AC
Start: 2017-10-30 — End: 2018-02-07

## 2017-10-30 MED ORDER — OLANZAPINE 5 MG PO TBDI
5 mg | ORAL_TABLET | Freq: Every day | ORAL | 1 refills | 30.00000 days | Status: AC | PRN
Start: 2017-10-30 — End: 2018-02-07

## 2017-10-30 MED ORDER — CLONAZEPAM 1 MG PO TAB
1 mg | ORAL_TABLET | Freq: Every day | ORAL | 0 refills | Status: AC
Start: 2017-10-30 — End: 2018-02-07

## 2017-10-31 ENCOUNTER — Encounter: Admit: 2017-10-31 | Discharge: 2017-10-31 | Payer: MEDICARE

## 2017-10-31 DIAGNOSIS — G249 Dystonia, unspecified: ICD-10-CM

## 2017-10-31 DIAGNOSIS — R12 Heartburn: ICD-10-CM

## 2017-10-31 DIAGNOSIS — K5900 Constipation, unspecified: ICD-10-CM

## 2017-10-31 DIAGNOSIS — R21 Rash and other nonspecific skin eruption: ICD-10-CM

## 2017-10-31 DIAGNOSIS — D72819 Decreased white blood cell count, unspecified: ICD-10-CM

## 2017-10-31 DIAGNOSIS — K56609 Unspecified intestinal obstruction, unspecified as to partial versus complete obstruction: ICD-10-CM

## 2017-10-31 DIAGNOSIS — F79 Unspecified intellectual disabilities: ICD-10-CM

## 2017-10-31 DIAGNOSIS — E039 Hypothyroidism, unspecified: ICD-10-CM

## 2017-10-31 DIAGNOSIS — Z9181 History of falling: ICD-10-CM

## 2017-10-31 DIAGNOSIS — R51 Headache: ICD-10-CM

## 2017-10-31 DIAGNOSIS — F840 Autistic disorder: Principal | ICD-10-CM

## 2017-10-31 DIAGNOSIS — A048 Other specified bacterial intestinal infections: ICD-10-CM

## 2017-10-31 DIAGNOSIS — R55 Syncope and collapse: ICD-10-CM

## 2017-10-31 DIAGNOSIS — Z79899 Other long term (current) drug therapy: ICD-10-CM

## 2017-11-05 ENCOUNTER — Encounter: Admit: 2017-11-05 | Discharge: 2017-11-05 | Payer: MEDICARE

## 2017-11-05 ENCOUNTER — Emergency Department: Admit: 2017-11-05 | Discharge: 2017-11-06 | Disposition: A | Discharge status: Home / Self Care | Payer: MEDICARE

## 2017-11-05 DIAGNOSIS — Z9181 History of falling: ICD-10-CM

## 2017-11-05 DIAGNOSIS — G249 Dystonia, unspecified: ICD-10-CM

## 2017-11-05 DIAGNOSIS — K56609 Unspecified intestinal obstruction, unspecified as to partial versus complete obstruction: ICD-10-CM

## 2017-11-05 DIAGNOSIS — R51 Headache: ICD-10-CM

## 2017-11-05 DIAGNOSIS — F79 Unspecified intellectual disabilities: ICD-10-CM

## 2017-11-05 DIAGNOSIS — R12 Heartburn: ICD-10-CM

## 2017-11-05 DIAGNOSIS — R21 Rash and other nonspecific skin eruption: ICD-10-CM

## 2017-11-05 DIAGNOSIS — A048 Other specified bacterial intestinal infections: ICD-10-CM

## 2017-11-05 DIAGNOSIS — R55 Syncope and collapse: ICD-10-CM

## 2017-11-05 DIAGNOSIS — K5900 Constipation, unspecified: ICD-10-CM

## 2017-11-05 DIAGNOSIS — E039 Hypothyroidism, unspecified: ICD-10-CM

## 2017-11-05 DIAGNOSIS — F840 Autistic disorder: Principal | ICD-10-CM

## 2017-11-05 DIAGNOSIS — D72819 Decreased white blood cell count, unspecified: ICD-10-CM

## 2017-11-05 DIAGNOSIS — Z79899 Other long term (current) drug therapy: ICD-10-CM

## 2017-11-06 ENCOUNTER — Emergency Department: Admit: 2017-11-06 | Discharge: 2017-11-06 | Payer: MEDICARE

## 2017-11-06 ENCOUNTER — Encounter: Admit: 2017-11-06 | Discharge: 2017-11-06 | Payer: MEDICARE

## 2017-11-06 DIAGNOSIS — N390 Urinary tract infection, site not specified: ICD-10-CM

## 2017-11-06 DIAGNOSIS — R102 Pelvic and perineal pain: ICD-10-CM

## 2017-11-06 DIAGNOSIS — F840 Autistic disorder: ICD-10-CM

## 2017-11-06 DIAGNOSIS — I4510 Unspecified right bundle-branch block: ICD-10-CM

## 2017-11-06 DIAGNOSIS — R002 Palpitations: Principal | ICD-10-CM

## 2017-11-06 LAB — COMPREHENSIVE METABOLIC PANEL
Lab: 0.7 mg/dL (ref 0.3–1.2)
Lab: 1.5 mg/dL — ABNORMAL HIGH (ref 0.4–1.24)
Lab: 104 U/L (ref 25–110)
Lab: 108 MMOL/L (ref 98–110)
Lab: 140 MMOL/L (ref 137–147)
Lab: 22 mg/dL (ref 7–25)
Lab: 24 U/L (ref 7–40)
Lab: 28 MMOL/L (ref 21–30)
Lab: 39 U/L (ref 7–56)
Lab: 4 (ref 3–12)
Lab: 4.2 MMOL/L (ref 3.5–5.1)
Lab: 4.9 g/dL (ref 3.5–5.0)
Lab: 47 mL/min — ABNORMAL LOW (ref >60)
Lab: 57 mL/min — ABNORMAL LOW (ref >60)
Lab: 6.7 g/dL (ref 6.0–8.0)
Lab: 9.9 mg/dL (ref 8.5–10.6)
Lab: 93 mg/dL (ref 70–100)

## 2017-11-06 LAB — URINALYSIS DIPSTICK
Lab: NEGATIVE MMOL/L (ref 21–30)
Lab: NEGATIVE U/L (ref 25–110)
Lab: NEGATIVE U/L (ref 7–40)
Lab: NEGATIVE g/dL (ref 3.5–5.0)
Lab: NEGATIVE mL/min (ref 0–0.80)
Lab: NEGATIVE mg/dL (ref 0.3–1.2)
Lab: POSITIVE K/UL — AB (ref 3–12)

## 2017-11-06 LAB — POC TROPONIN
Lab: 0 ng/mL (ref 0.00–0.05)
Lab: 0 ng/mL (ref 0.00–0.05)

## 2017-11-06 LAB — URINALYSIS, MICROSCOPIC

## 2017-11-06 LAB — PTT (APTT): Lab: 47 s — ABNORMAL HIGH (ref 24.0–36.5)

## 2017-11-06 LAB — CBC: Lab: 4.4 10*3/uL — ABNORMAL LOW (ref 4.5–11.0)

## 2017-11-06 LAB — PROTIME INR (PT): Lab: 1 M/UL (ref 0.8–1.2)

## 2017-11-06 MED ORDER — CEPHALEXIN 500 MG PO CAP
500 mg | ORAL_CAPSULE | Freq: Two times a day (BID) | ORAL | 0 refills | Status: AC
Start: 2017-11-06 — End: ?

## 2017-11-06 MED ORDER — CEFTRIAXONE INJ 1GM IVP
1 g | Freq: Once | INTRAVENOUS | 0 refills | Status: CP
Start: 2017-11-06 — End: ?
  Administered 2017-11-06: 07:00:00 1 g via INTRAVENOUS

## 2017-11-08 ENCOUNTER — Encounter: Admit: 2017-11-08 | Discharge: 2017-11-08 | Payer: MEDICARE

## 2017-11-11 ENCOUNTER — Encounter: Admit: 2017-11-11 | Discharge: 2017-11-11 | Payer: MEDICARE

## 2017-11-11 ENCOUNTER — Encounter: Admit: 2017-11-11 | Discharge: 2017-11-12 | Payer: MEDICARE

## 2017-11-11 ENCOUNTER — Ambulatory Visit: Admit: 2017-11-11 | Discharge: 2017-11-12 | Payer: MEDICARE

## 2017-11-11 ENCOUNTER — Ambulatory Visit: Admit: 2017-11-11 | Discharge: 2017-11-11 | Payer: MEDICARE

## 2017-11-11 DIAGNOSIS — R55 Syncope and collapse: ICD-10-CM

## 2017-11-11 DIAGNOSIS — Z9181 History of falling: ICD-10-CM

## 2017-11-11 DIAGNOSIS — R002 Palpitations: ICD-10-CM

## 2017-11-11 DIAGNOSIS — R21 Rash and other nonspecific skin eruption: ICD-10-CM

## 2017-11-11 DIAGNOSIS — R51 Headache: ICD-10-CM

## 2017-11-11 DIAGNOSIS — R079 Chest pain, unspecified: ICD-10-CM

## 2017-11-11 DIAGNOSIS — R12 Heartburn: ICD-10-CM

## 2017-11-11 DIAGNOSIS — I4510 Unspecified right bundle-branch block: Principal | ICD-10-CM

## 2017-11-11 DIAGNOSIS — K56609 Unspecified intestinal obstruction, unspecified as to partial versus complete obstruction: ICD-10-CM

## 2017-11-11 DIAGNOSIS — D72819 Decreased white blood cell count, unspecified: ICD-10-CM

## 2017-11-11 DIAGNOSIS — E039 Hypothyroidism, unspecified: ICD-10-CM

## 2017-11-11 DIAGNOSIS — Z79899 Other long term (current) drug therapy: ICD-10-CM

## 2017-11-11 DIAGNOSIS — A048 Other specified bacterial intestinal infections: ICD-10-CM

## 2017-11-11 DIAGNOSIS — F840 Autistic disorder: Principal | ICD-10-CM

## 2017-11-11 DIAGNOSIS — F79 Unspecified intellectual disabilities: ICD-10-CM

## 2017-11-11 DIAGNOSIS — K5900 Constipation, unspecified: ICD-10-CM

## 2017-11-11 DIAGNOSIS — G249 Dystonia, unspecified: ICD-10-CM

## 2017-11-12 ENCOUNTER — Encounter: Admit: 2017-11-12 | Discharge: 2017-11-12 | Payer: MEDICARE

## 2017-11-13 ENCOUNTER — Encounter: Admit: 2017-11-13 | Discharge: 2017-11-13 | Payer: MEDICARE

## 2017-12-03 ENCOUNTER — Encounter: Admit: 2017-12-03 | Discharge: 2017-12-03 | Payer: MEDICARE

## 2017-12-03 ENCOUNTER — Ambulatory Visit: Admit: 2017-12-03 | Discharge: 2017-12-04 | Payer: MEDICARE

## 2017-12-03 DIAGNOSIS — Z79899 Other long term (current) drug therapy: ICD-10-CM

## 2017-12-03 DIAGNOSIS — R51 Headache: ICD-10-CM

## 2017-12-03 DIAGNOSIS — R12 Heartburn: ICD-10-CM

## 2017-12-03 DIAGNOSIS — F840 Autistic disorder: Principal | ICD-10-CM

## 2017-12-03 DIAGNOSIS — R21 Rash and other nonspecific skin eruption: ICD-10-CM

## 2017-12-03 DIAGNOSIS — K56609 Unspecified intestinal obstruction, unspecified as to partial versus complete obstruction: ICD-10-CM

## 2017-12-03 DIAGNOSIS — A048 Other specified bacterial intestinal infections: ICD-10-CM

## 2017-12-03 DIAGNOSIS — F79 Unspecified intellectual disabilities: ICD-10-CM

## 2017-12-03 DIAGNOSIS — R002 Palpitations: Principal | ICD-10-CM

## 2017-12-03 DIAGNOSIS — R55 Syncope and collapse: ICD-10-CM

## 2017-12-03 DIAGNOSIS — E039 Hypothyroidism, unspecified: ICD-10-CM

## 2017-12-03 DIAGNOSIS — Z9181 History of falling: ICD-10-CM

## 2017-12-03 DIAGNOSIS — G249 Dystonia, unspecified: ICD-10-CM

## 2017-12-03 DIAGNOSIS — D72819 Decreased white blood cell count, unspecified: ICD-10-CM

## 2017-12-03 DIAGNOSIS — K5900 Constipation, unspecified: ICD-10-CM

## 2017-12-18 ENCOUNTER — Encounter: Admit: 2017-12-18 | Discharge: 2017-12-18 | Payer: MEDICARE

## 2017-12-24 ENCOUNTER — Encounter: Admit: 2017-12-24 | Discharge: 2017-12-24 | Payer: MEDICARE

## 2017-12-24 ENCOUNTER — Ambulatory Visit: Admit: 2017-12-24 | Discharge: 2017-12-24 | Payer: MEDICARE

## 2017-12-24 DIAGNOSIS — R634 Abnormal weight loss: Principal | ICD-10-CM

## 2017-12-24 DIAGNOSIS — R55 Syncope and collapse: ICD-10-CM

## 2017-12-24 DIAGNOSIS — K56609 Unspecified intestinal obstruction, unspecified as to partial versus complete obstruction: ICD-10-CM

## 2017-12-24 DIAGNOSIS — R12 Heartburn: ICD-10-CM

## 2017-12-24 DIAGNOSIS — F79 Unspecified intellectual disabilities: ICD-10-CM

## 2017-12-24 DIAGNOSIS — A048 Other specified bacterial intestinal infections: ICD-10-CM

## 2017-12-24 DIAGNOSIS — Z79899 Other long term (current) drug therapy: ICD-10-CM

## 2017-12-24 DIAGNOSIS — R51 Headache: ICD-10-CM

## 2017-12-24 DIAGNOSIS — E039 Hypothyroidism, unspecified: ICD-10-CM

## 2017-12-24 DIAGNOSIS — G249 Dystonia, unspecified: ICD-10-CM

## 2017-12-24 DIAGNOSIS — K5900 Constipation, unspecified: ICD-10-CM

## 2017-12-24 DIAGNOSIS — F840 Autistic disorder: Principal | ICD-10-CM

## 2017-12-24 DIAGNOSIS — Z9181 History of falling: ICD-10-CM

## 2017-12-24 DIAGNOSIS — R21 Rash and other nonspecific skin eruption: ICD-10-CM

## 2017-12-24 DIAGNOSIS — D72819 Decreased white blood cell count, unspecified: ICD-10-CM

## 2018-01-02 ENCOUNTER — Encounter: Admit: 2018-01-02 | Discharge: 2018-01-02 | Payer: MEDICARE

## 2018-01-07 MED ORDER — METRONIDAZOLE 500 MG PO TAB
500 mg | ORAL_TABLET | Freq: Three times a day (TID) | ORAL | 0 refills | Status: AC
Start: 2018-01-07 — End: ?

## 2018-01-16 ENCOUNTER — Encounter: Admit: 2018-01-16 | Discharge: 2018-01-16 | Payer: MEDICARE

## 2018-01-16 ENCOUNTER — Ambulatory Visit: Admit: 2018-01-16 | Discharge: 2018-01-16 | Payer: MEDICARE

## 2018-01-16 DIAGNOSIS — E232 Diabetes insipidus: ICD-10-CM

## 2018-01-16 DIAGNOSIS — G249 Dystonia, unspecified: ICD-10-CM

## 2018-01-16 DIAGNOSIS — K5900 Constipation, unspecified: ICD-10-CM

## 2018-01-16 DIAGNOSIS — R21 Rash and other nonspecific skin eruption: ICD-10-CM

## 2018-01-16 DIAGNOSIS — Z9181 History of falling: ICD-10-CM

## 2018-01-16 DIAGNOSIS — A048 Other specified bacterial intestinal infections: ICD-10-CM

## 2018-01-16 DIAGNOSIS — R739 Hyperglycemia, unspecified: ICD-10-CM

## 2018-01-16 DIAGNOSIS — Z79899 Other long term (current) drug therapy: ICD-10-CM

## 2018-01-16 DIAGNOSIS — D72819 Decreased white blood cell count, unspecified: ICD-10-CM

## 2018-01-16 DIAGNOSIS — F840 Autistic disorder: Principal | ICD-10-CM

## 2018-01-16 DIAGNOSIS — K56609 Unspecified intestinal obstruction, unspecified as to partial versus complete obstruction: ICD-10-CM

## 2018-01-16 DIAGNOSIS — R12 Heartburn: ICD-10-CM

## 2018-01-16 DIAGNOSIS — M6250 Muscle wasting and atrophy, not elsewhere classified, unspecified site: Secondary | ICD-10-CM

## 2018-01-16 DIAGNOSIS — F79 Unspecified intellectual disabilities: ICD-10-CM

## 2018-01-16 DIAGNOSIS — E559 Vitamin D deficiency, unspecified: Secondary | ICD-10-CM

## 2018-01-16 DIAGNOSIS — R51 Headache: ICD-10-CM

## 2018-01-16 DIAGNOSIS — E039 Hypothyroidism, unspecified: ICD-10-CM

## 2018-01-16 DIAGNOSIS — R634 Abnormal weight loss: Secondary | ICD-10-CM

## 2018-01-16 DIAGNOSIS — R55 Syncope and collapse: ICD-10-CM

## 2018-01-16 LAB — CBC AND DIFF
Lab: 14 % (ref 11–15)
Lab: 15 g/dL — ABNORMAL HIGH (ref 13.5–16.5)
Lab: 155 K/UL (ref 150–400)
Lab: 3.9 K/UL — ABNORMAL LOW (ref 4.5–11.0)
Lab: 31 pg — ABNORMAL LOW (ref 26–34)
Lab: 32 g/dL (ref 32.0–36.0)
Lab: 4.8 M/UL — ABNORMAL LOW (ref 4.4–5.5)
Lab: 47 % — ABNORMAL LOW (ref 40–50)
Lab: 97 FL — ABNORMAL LOW (ref 80–100)

## 2018-01-16 LAB — TSH WITH FREE T4 REFLEX: Lab: 1.9 uU/mL (ref 0.35–5.00)

## 2018-01-16 LAB — COMPREHENSIVE METABOLIC PANEL
Lab: 141 MMOL/L (ref 137–147)
Lab: 4.1 MMOL/L (ref 3.5–5.1)

## 2018-01-16 LAB — C REACTIVE PROTEIN (CRP): Lab: 0 mg/dL (ref <1.0)

## 2018-01-16 LAB — HEMOGLOBIN A1C: Lab: 5.1 % (ref 4.0–6.0)

## 2018-01-16 LAB — SED RATE: Lab: <1 mm/h (ref 0–20)

## 2018-01-16 LAB — 25-OH VITAMIN D (D2 + D3): Lab: 27 ng/mL — ABNORMAL LOW (ref 30–80)

## 2018-01-22 ENCOUNTER — Encounter: Admit: 2018-01-22 | Discharge: 2018-01-22 | Payer: MEDICARE

## 2018-01-26 ENCOUNTER — Encounter: Admit: 2018-01-26 | Discharge: 2018-01-26 | Payer: MEDICARE

## 2018-01-26 DIAGNOSIS — Z79899 Other long term (current) drug therapy: ICD-10-CM

## 2018-01-26 DIAGNOSIS — R21 Rash and other nonspecific skin eruption: ICD-10-CM

## 2018-01-26 DIAGNOSIS — G249 Dystonia, unspecified: ICD-10-CM

## 2018-01-26 DIAGNOSIS — R12 Heartburn: ICD-10-CM

## 2018-01-26 DIAGNOSIS — K56609 Unspecified intestinal obstruction, unspecified as to partial versus complete obstruction: ICD-10-CM

## 2018-01-26 DIAGNOSIS — D72819 Decreased white blood cell count, unspecified: ICD-10-CM

## 2018-01-26 DIAGNOSIS — F79 Unspecified intellectual disabilities: ICD-10-CM

## 2018-01-26 DIAGNOSIS — R55 Syncope and collapse: ICD-10-CM

## 2018-01-26 DIAGNOSIS — R51 Headache: ICD-10-CM

## 2018-01-26 DIAGNOSIS — F840 Autistic disorder: Principal | ICD-10-CM

## 2018-01-26 DIAGNOSIS — K5900 Constipation, unspecified: ICD-10-CM

## 2018-01-26 DIAGNOSIS — E039 Hypothyroidism, unspecified: ICD-10-CM

## 2018-01-26 DIAGNOSIS — A048 Other specified bacterial intestinal infections: ICD-10-CM

## 2018-01-26 DIAGNOSIS — Z9181 History of falling: ICD-10-CM

## 2018-02-07 ENCOUNTER — Ambulatory Visit: Admit: 2018-02-07 | Discharge: 2018-02-08 | Payer: MEDICARE

## 2018-02-07 ENCOUNTER — Encounter: Admit: 2018-02-07 | Discharge: 2018-02-07 | Payer: MEDICARE

## 2018-02-07 DIAGNOSIS — R55 Syncope and collapse: ICD-10-CM

## 2018-02-07 DIAGNOSIS — R12 Heartburn: ICD-10-CM

## 2018-02-07 DIAGNOSIS — R51 Headache: ICD-10-CM

## 2018-02-07 DIAGNOSIS — F79 Unspecified intellectual disabilities: ICD-10-CM

## 2018-02-07 DIAGNOSIS — K5900 Constipation, unspecified: ICD-10-CM

## 2018-02-07 DIAGNOSIS — K56609 Unspecified intestinal obstruction, unspecified as to partial versus complete obstruction: ICD-10-CM

## 2018-02-07 DIAGNOSIS — A048 Other specified bacterial intestinal infections: ICD-10-CM

## 2018-02-07 DIAGNOSIS — G249 Dystonia, unspecified: ICD-10-CM

## 2018-02-07 DIAGNOSIS — F250 Schizoaffective disorder, bipolar type: Principal | ICD-10-CM

## 2018-02-07 DIAGNOSIS — D72819 Decreased white blood cell count, unspecified: ICD-10-CM

## 2018-02-07 DIAGNOSIS — R21 Rash and other nonspecific skin eruption: ICD-10-CM

## 2018-02-07 DIAGNOSIS — Z9181 History of falling: ICD-10-CM

## 2018-02-07 DIAGNOSIS — E039 Hypothyroidism, unspecified: ICD-10-CM

## 2018-02-07 DIAGNOSIS — F840 Autistic disorder: Principal | ICD-10-CM

## 2018-02-07 DIAGNOSIS — Z79899 Other long term (current) drug therapy: ICD-10-CM

## 2018-02-07 MED ORDER — QUETIAPINE 300 MG PO TAB
ORAL_TABLET | 0 refills | Status: AC
Start: 2018-02-07 — End: 2018-05-02

## 2018-02-07 MED ORDER — TEMAZEPAM 15 MG PO CAP
15 mg | ORAL_CAPSULE | Freq: Every evening | ORAL | 0 refills | Status: AC
Start: 2018-02-07 — End: 2018-05-01

## 2018-02-07 MED ORDER — LITHIUM CARBONATE 150 MG PO CAP
ORAL_CAPSULE | ORAL | 0 refills | 90.00000 days | Status: AC
Start: 2018-02-07 — End: 2018-05-02

## 2018-02-07 MED ORDER — LAMOTRIGINE 100 MG PO TAB
100 mg | ORAL_TABLET | Freq: Two times a day (BID) | ORAL | 0 refills | Status: AC
Start: 2018-02-07 — End: 2018-05-02

## 2018-02-07 MED ORDER — QUETIAPINE 100 MG PO TAB
ORAL_TABLET | 0 refills | Status: AC
Start: 2018-02-07 — End: 2018-05-02

## 2018-02-07 MED ORDER — OLANZAPINE 20 MG PO TAB
20 mg | ORAL_TABLET | Freq: Every evening | ORAL | 0 refills | 30.00000 days | Status: AC
Start: 2018-02-07 — End: 2018-05-02

## 2018-02-07 MED ORDER — OLANZAPINE 10 MG PO TAB
ORAL_TABLET | ORAL | 0 refills | 30.00000 days | Status: AC
Start: 2018-02-07 — End: 2018-05-02

## 2018-02-07 MED ORDER — CLONAZEPAM 0.5 MG PO TAB
.5 mg | ORAL_TABLET | Freq: Every day | ORAL | 0 refills | Status: AC
Start: 2018-02-07 — End: 2018-05-02

## 2018-02-07 MED ORDER — TEMAZEPAM 30 MG PO CAP
30 mg | ORAL_CAPSULE | Freq: Every evening | ORAL | 0 refills | Status: AC
Start: 2018-02-07 — End: 2018-05-02

## 2018-02-07 MED ORDER — OLANZAPINE 5 MG PO TBDI
5 mg | ORAL_TABLET | Freq: Every day | ORAL | 0 refills | 30.00000 days | Status: AC | PRN
Start: 2018-02-07 — End: 2018-05-02

## 2018-02-07 MED ORDER — CLONAZEPAM 1 MG PO TAB
1 mg | ORAL_TABLET | Freq: Every day | ORAL | 0 refills | Status: AC
Start: 2018-02-07 — End: 2018-05-02

## 2018-02-08 DIAGNOSIS — F840 Autistic disorder: ICD-10-CM

## 2018-02-08 DIAGNOSIS — F902 Attention-deficit hyperactivity disorder, combined type: ICD-10-CM

## 2018-02-18 ENCOUNTER — Encounter: Admit: 2018-02-18 | Discharge: 2018-02-18 | Payer: MEDICARE

## 2018-02-18 MED ORDER — LINZESS 290 MCG PO CAP
ORAL_CAPSULE | Freq: Every day | ORAL | 3 refills | 30.00000 days | Status: AC
Start: 2018-02-18 — End: 2018-07-02

## 2018-04-02 ENCOUNTER — Ambulatory Visit: Admit: 2018-04-02 | Discharge: 2018-04-03 | Payer: MEDICARE

## 2018-04-02 ENCOUNTER — Encounter: Admit: 2018-04-02 | Discharge: 2018-04-02 | Payer: MEDICARE

## 2018-04-02 DIAGNOSIS — R21 Rash and other nonspecific skin eruption: ICD-10-CM

## 2018-04-02 DIAGNOSIS — R51 Headache: ICD-10-CM

## 2018-04-02 DIAGNOSIS — F840 Autistic disorder: Principal | ICD-10-CM

## 2018-04-02 DIAGNOSIS — R12 Heartburn: ICD-10-CM

## 2018-04-02 DIAGNOSIS — Z79899 Other long term (current) drug therapy: ICD-10-CM

## 2018-04-02 DIAGNOSIS — K5900 Constipation, unspecified: Principal | ICD-10-CM

## 2018-04-02 DIAGNOSIS — F79 Unspecified intellectual disabilities: ICD-10-CM

## 2018-04-02 DIAGNOSIS — D72819 Decreased white blood cell count, unspecified: ICD-10-CM

## 2018-04-02 DIAGNOSIS — E039 Hypothyroidism, unspecified: ICD-10-CM

## 2018-04-02 DIAGNOSIS — R634 Abnormal weight loss: ICD-10-CM

## 2018-04-02 DIAGNOSIS — G249 Dystonia, unspecified: ICD-10-CM

## 2018-04-02 DIAGNOSIS — A048 Other specified bacterial intestinal infections: ICD-10-CM

## 2018-04-02 DIAGNOSIS — R55 Syncope and collapse: ICD-10-CM

## 2018-04-02 DIAGNOSIS — Z9181 History of falling: ICD-10-CM

## 2018-04-02 DIAGNOSIS — K56609 Unspecified intestinal obstruction, unspecified as to partial versus complete obstruction: ICD-10-CM

## 2018-04-02 MED ORDER — LIPASE-PROTEASE-AMYLASE (CREON 24,000) 24,000-76,000- 120,000 UNIT PO CPDR
3 | ORAL_CAPSULE | Freq: Three times a day (TID) | ORAL | 5 refills | 30.00000 days | Status: AC
Start: 2018-04-02 — End: 2018-07-02

## 2018-05-02 ENCOUNTER — Ambulatory Visit: Admit: 2018-05-02 | Discharge: 2018-05-03 | Payer: MEDICARE

## 2018-05-02 MED ORDER — OLANZAPINE 10 MG PO TAB
ORAL_TABLET | ORAL | 1 refills | 30.00000 days | Status: AC
Start: 2018-05-02 — End: 2018-11-04

## 2018-05-02 MED ORDER — QUETIAPINE 100 MG PO TAB
ORAL_TABLET | 4 refills | Status: AC
Start: 2018-05-02 — End: 2018-10-01

## 2018-05-02 MED ORDER — OLANZAPINE 20 MG PO TAB
20 mg | ORAL_TABLET | Freq: Every evening | ORAL | 1 refills | 30.00000 days | Status: AC
Start: 2018-05-02 — End: 2018-10-01

## 2018-05-02 MED ORDER — LAMOTRIGINE 100 MG PO TAB
150 mg | ORAL_TABLET | Freq: Two times a day (BID) | ORAL | 4 refills | Status: AC
Start: 2018-05-02 — End: 2018-10-01

## 2018-05-02 MED ORDER — CLONAZEPAM 1 MG PO TAB
1 mg | ORAL_TABLET | Freq: Every day | ORAL | 1 refills | Status: AC
Start: 2018-05-02 — End: 2018-10-01

## 2018-05-02 MED ORDER — QUETIAPINE 300 MG PO TAB
ORAL_TABLET | 4 refills | Status: AC
Start: 2018-05-02 — End: 2018-10-01

## 2018-05-02 MED ORDER — TEMAZEPAM 30 MG PO CAP
30 mg | ORAL_CAPSULE | Freq: Every evening | ORAL | 4 refills | Status: AC
Start: 2018-05-02 — End: 2018-08-29

## 2018-05-02 MED ORDER — LITHIUM CARBONATE 150 MG PO CAP
ORAL_CAPSULE | ORAL | 1 refills | 90.00000 days | Status: AC
Start: 2018-05-02 — End: 2018-10-01

## 2018-05-02 MED ORDER — OLANZAPINE 5 MG PO TBDI
5 mg | ORAL_TABLET | Freq: Every day | ORAL | 1 refills | 30.00000 days | Status: AC | PRN
Start: 2018-05-02 — End: 2018-10-01

## 2018-05-02 MED ORDER — CLONAZEPAM 0.5 MG PO TAB
.5 mg | ORAL_TABLET | Freq: Every day | ORAL | 1 refills | Status: AC
Start: 2018-05-02 — End: 2018-10-01

## 2018-05-03 DIAGNOSIS — F840 Autistic disorder: ICD-10-CM

## 2018-05-03 DIAGNOSIS — E0822 Diabetes mellitus due to underlying condition with diabetic chronic kidney disease: ICD-10-CM

## 2018-05-03 DIAGNOSIS — E748 Other specified disorders of carbohydrate metabolism: ICD-10-CM

## 2018-05-03 DIAGNOSIS — F902 Attention-deficit hyperactivity disorder, combined type: ICD-10-CM

## 2018-05-03 DIAGNOSIS — F250 Schizoaffective disorder, bipolar type: Principal | ICD-10-CM

## 2018-05-22 ENCOUNTER — Encounter: Admit: 2018-05-22 | Discharge: 2018-05-22 | Payer: MEDICARE

## 2018-05-23 MED ORDER — TEMAZEPAM 15 MG PO CAP
15 mg | ORAL_CAPSULE | Freq: Every evening | ORAL | 2 refills | Status: AC
Start: 2018-05-23 — End: 2018-08-29

## 2018-07-02 ENCOUNTER — Ambulatory Visit: Admit: 2018-07-02 | Discharge: 2018-07-02 | Payer: MEDICARE

## 2018-07-02 ENCOUNTER — Encounter: Admit: 2018-07-02 | Discharge: 2018-07-02 | Payer: MEDICARE

## 2018-07-02 DIAGNOSIS — E748 Other specified disorders of carbohydrate metabolism: ICD-10-CM

## 2018-07-02 DIAGNOSIS — F250 Schizoaffective disorder, bipolar type: Principal | ICD-10-CM

## 2018-07-02 DIAGNOSIS — G249 Dystonia, unspecified: ICD-10-CM

## 2018-07-02 DIAGNOSIS — R55 Syncope and collapse: ICD-10-CM

## 2018-07-02 DIAGNOSIS — Z9181 History of falling: ICD-10-CM

## 2018-07-02 DIAGNOSIS — R51 Headache: ICD-10-CM

## 2018-07-02 DIAGNOSIS — E0822 Diabetes mellitus due to underlying condition with diabetic chronic kidney disease: ICD-10-CM

## 2018-07-02 DIAGNOSIS — R21 Rash and other nonspecific skin eruption: ICD-10-CM

## 2018-07-02 DIAGNOSIS — R12 Heartburn: ICD-10-CM

## 2018-07-02 DIAGNOSIS — F79 Unspecified intellectual disabilities: ICD-10-CM

## 2018-07-02 DIAGNOSIS — F840 Autistic disorder: Principal | ICD-10-CM

## 2018-07-02 DIAGNOSIS — A048 Other specified bacterial intestinal infections: ICD-10-CM

## 2018-07-02 DIAGNOSIS — K56609 Unspecified intestinal obstruction, unspecified as to partial versus complete obstruction: ICD-10-CM

## 2018-07-02 DIAGNOSIS — K5904 Chronic idiopathic constipation: ICD-10-CM

## 2018-07-02 DIAGNOSIS — E039 Hypothyroidism, unspecified: ICD-10-CM

## 2018-07-02 DIAGNOSIS — D72819 Decreased white blood cell count, unspecified: ICD-10-CM

## 2018-07-02 DIAGNOSIS — K5900 Constipation, unspecified: ICD-10-CM

## 2018-07-02 DIAGNOSIS — Z79899 Other long term (current) drug therapy: ICD-10-CM

## 2018-07-02 LAB — COMPREHENSIVE METABOLIC PANEL
Lab: 109 MMOL/L (ref 98–110)
Lab: 110 U/L (ref 25–110)
Lab: 144 MMOL/L (ref 137–147)
Lab: 19 U/L (ref 7–40)
Lab: 29 U/L (ref 7–56)
Lab: 30 MMOL/L (ref 21–30)
Lab: 4.1 MMOL/L (ref 3.5–5.1)
Lab: 4.7 g/dL (ref 3.5–5.0)
Lab: 60 mL/min — ABNORMAL LOW (ref >60)
Lab: >60 mL/min (ref >60)
Lab: 5 (ref 3–12)

## 2018-07-02 LAB — HEMOGLOBIN A1C: Lab: 5.3 % — ABNORMAL HIGH (ref >40)

## 2018-07-02 LAB — LIPID PROFILE
Lab: 118 mg/dL (ref <200)
Lab: 14 mg/dL (ref 6.0–8.0)
Lab: 58 mg/dL (ref <100)
Lab: 72 mg/dL (ref <150)
Lab: 74 mg/dL (ref 0.3–1.2)

## 2018-07-02 LAB — LITHIUM LEVEL: Lab: 0.9 meq/L (ref 0.6–1.2)

## 2018-07-02 MED ORDER — LUBIPROSTONE 24 MCG PO CAP
24 ug | ORAL_CAPSULE | Freq: Two times a day (BID) | ORAL | 5 refills | 90.00000 days | Status: AC
Start: 2018-07-02 — End: 2018-10-01

## 2018-07-02 MED ORDER — LIPASE-PROTEASE-AMYLASE (CREON 24,000) 24,000-76,000- 120,000 UNIT PO CPDR
3 | ORAL_CAPSULE | Freq: Three times a day (TID) | ORAL | 5 refills | 30.00000 days | Status: AC
Start: 2018-07-02 — End: 2019-03-16

## 2018-08-01 ENCOUNTER — Encounter: Admit: 2018-08-01 | Discharge: 2018-08-01 | Payer: MEDICARE

## 2018-08-29 ENCOUNTER — Encounter: Admit: 2018-08-29 | Discharge: 2018-08-29 | Payer: MEDICARE

## 2018-08-29 MED ORDER — TEMAZEPAM 15 MG PO CAP
15 mg | ORAL_CAPSULE | Freq: Every evening | ORAL | 2 refills | Status: AC
Start: 2018-08-29 — End: 2018-10-01

## 2018-09-29 NOTE — Progress Notes
More recent testing in February 2019 revealed normal thyroid ultrasound, normal abdominal and mesenteric Doppler ultrasound, and normal cosyntropin stimulation test.    He has been tried on lactulose in the past but this resulted in a lot of bloating and cramping and did not seem to help from a bowel movement standpoint.  ???  PAST MEDICAL HISTORY:   1. 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 Given Gino's mental capacity this was somewhat limited testing and was felt to be inconclusive. Sitz marker study was performed. Day five x-ray revealed 24 markers spread throughout the colon.   2. Autism.  3. Bipolar disorder.  4. Heartburn.  5. Hypothyroidism, on replacement therapy.  6. History small bowel obstruction February 2015.  7. Normal CT A/P 08/07/16.  8. Normal colonoscopy???06/17/17.  9. Normal EGD, EUS 06/17/17.  10. H pylori gastritis, RX 05/2017.  11. SIBO 11/2017 (Flagyl).  ???  FAMILY HISTORY:??????maternal aunt???colon cancer. ???No other GI cancers.       Review of Systems   HENT: Positive for postnasal drip.    Gastrointestinal: Positive for abdominal distention and constipation.   Endocrine: Positive for cold intolerance and heat intolerance.   Genitourinary: Positive for frequency.   Psychiatric/Behavioral: Positive for agitation, behavioral problems and sleep disturbance. The patient is nervous/anxious and is hyperactive.    All other systems reviewed and are negative.        Objective:         ??? aspirin EC 81 mg tablet Take 81 mg by mouth daily. Take with food.   ??? atorvastatin (LIPITOR) 20 mg tablet Take 20 mg by mouth at bedtime daily.   ??? bisacodyl (DULCOLAX) 5 mg tablet Take 1 Tab by mouth twice daily.   ??? clonazePAM (KLONOPIN) 0.5 mg tablet Take one tablet by mouth daily.   ??? clonazePAM (KLONOPIN) 1 mg tablet Take one tablet by mouth daily.   ??? DOCOSAHEXANOIC ACID/EPA (FISH OIL PO) Take 2 Caps by mouth daily. He is on an aggressive bowel regimen.  He is previously been tried on lactulose without benefit.  He is not on any narcotics and therefore I do not think Relistor or Movantik would be of benefit.  We will see if insurance will approve Amitiza.  ???  He has no obvious GI explanation for his ongoing weight loss. ???I discussed the there is concern for underlying malignancy but this is not been identified based on imaging. ???I wonder if this could be an underlying endocrinology process although his thyroid and adrenal function of been normal.  ???  At the present time I recommend the following:  ???  1. Continue current bowel regimen.  2. Will see if insurance will cover Amitiza 24 mcg BID.  3. More frequent enemas to help with evacuation.  4. Continue Creon 24,000 units of lipase, 3 with meals.  5. Continue diet as tolerated with increase in caloric intake.  6. Follow up in the GI Clinic with Volney American in 3 months.  I have asked him to give a call in the interim with any troubles.  ???  I had a very long discussion and all questions were answered.  ???

## 2018-10-01 ENCOUNTER — Encounter: Admit: 2018-10-01 | Discharge: 2018-10-01 | Payer: MEDICARE

## 2018-10-01 ENCOUNTER — Ambulatory Visit: Admit: 2018-10-01 | Discharge: 2018-10-01 | Payer: MEDICARE

## 2018-10-01 ENCOUNTER — Ambulatory Visit: Admit: 2018-10-01 | Discharge: 2018-10-02 | Payer: MEDICARE

## 2018-10-01 DIAGNOSIS — K56609 Unspecified intestinal obstruction, unspecified as to partial versus complete obstruction: ICD-10-CM

## 2018-10-01 DIAGNOSIS — R21 Rash and other nonspecific skin eruption: ICD-10-CM

## 2018-10-01 DIAGNOSIS — D72819 Decreased white blood cell count, unspecified: ICD-10-CM

## 2018-10-01 DIAGNOSIS — R55 Syncope and collapse: ICD-10-CM

## 2018-10-01 DIAGNOSIS — R12 Heartburn: ICD-10-CM

## 2018-10-01 DIAGNOSIS — F840 Autistic disorder: Principal | ICD-10-CM

## 2018-10-01 DIAGNOSIS — Z79899 Other long term (current) drug therapy: ICD-10-CM

## 2018-10-01 DIAGNOSIS — A048 Other specified bacterial intestinal infections: ICD-10-CM

## 2018-10-01 DIAGNOSIS — F79 Unspecified intellectual disabilities: ICD-10-CM

## 2018-10-01 DIAGNOSIS — F902 Attention-deficit hyperactivity disorder, combined type: Secondary | ICD-10-CM

## 2018-10-01 DIAGNOSIS — Z9181 History of falling: ICD-10-CM

## 2018-10-01 DIAGNOSIS — K5900 Constipation, unspecified: ICD-10-CM

## 2018-10-01 DIAGNOSIS — R51 Headache: ICD-10-CM

## 2018-10-01 DIAGNOSIS — F250 Schizoaffective disorder, bipolar type: Secondary | ICD-10-CM

## 2018-10-01 DIAGNOSIS — G249 Dystonia, unspecified: ICD-10-CM

## 2018-10-01 DIAGNOSIS — E039 Hypothyroidism, unspecified: ICD-10-CM

## 2018-10-01 MED ORDER — OLANZAPINE 5 MG PO TBDI
5 mg | ORAL_TABLET | Freq: Every day | ORAL | 1 refills | 30.00000 days | Status: AC | PRN
Start: 2018-10-01 — End: 2018-11-26

## 2018-10-01 MED ORDER — OLANZAPINE 20 MG PO TAB
20 mg | ORAL_TABLET | Freq: Every evening | ORAL | 2 refills | 30.00000 days | Status: AC
Start: 2018-10-01 — End: 2018-11-26

## 2018-10-01 MED ORDER — QUETIAPINE 300 MG PO TAB
ORAL_TABLET | 4 refills | Status: AC
Start: 2018-10-01 — End: 2018-11-26

## 2018-10-01 MED ORDER — CLONAZEPAM 0.5 MG PO TAB
.5 mg | ORAL_TABLET | Freq: Every day | ORAL | 2 refills | Status: AC
Start: 2018-10-01 — End: 2018-10-28

## 2018-10-01 MED ORDER — LITHIUM CARBONATE 150 MG PO CAP
ORAL_CAPSULE | ORAL | 1 refills | 90.00000 days | Status: AC
Start: 2018-10-01 — End: 2018-11-26

## 2018-10-01 MED ORDER — CLONAZEPAM 1 MG PO TAB
1 mg | ORAL_TABLET | Freq: Every day | ORAL | 2 refills | Status: AC
Start: 2018-10-01 — End: 2018-11-07

## 2018-10-01 MED ORDER — PROPRANOLOL 10 MG PO TAB
10 mg | ORAL_TABLET | Freq: Two times a day (BID) | ORAL | 2 refills | Status: AC
Start: 2018-10-01 — End: 2018-12-31

## 2018-10-01 MED ORDER — LUBIPROSTONE 24 MCG PO CAP
24 ug | ORAL_CAPSULE | Freq: Two times a day (BID) | ORAL | 5 refills | 90.00000 days | Status: AC
Start: 2018-10-01 — End: 2019-07-09

## 2018-10-01 MED ORDER — LAMOTRIGINE 100 MG PO TAB
150 mg | ORAL_TABLET | Freq: Two times a day (BID) | ORAL | 4 refills | Status: AC
Start: 2018-10-01 — End: 2018-11-26

## 2018-10-01 MED ORDER — QUETIAPINE 100 MG PO TAB
ORAL_TABLET | 4 refills | Status: AC
Start: 2018-10-01 — End: 2018-11-26

## 2018-10-01 MED ORDER — TEMAZEPAM 30 MG PO CAP
30 mg | ORAL_CAPSULE | Freq: Every evening | ORAL | 2 refills | Status: AC | PRN
Start: 2018-10-01 — End: 2018-10-03

## 2018-10-01 NOTE — Progress Notes
??? Alcohol use: No   ??? Drug use: No     Social History     Substance and Sexual Activity   Drug Use No       Review of Systems   Respiratory: Negative for shortness of breath.    Gastrointestinal: Positive for abdominal pain. Negative for diarrhea, nausea and vomiting.   Neurological: Negative for headaches.   Psychiatric/Behavioral: Positive for agitation, behavioral problems, hallucinations and sleep disturbance. Negative for suicidal ideas.          Objective:  ??? aspirin EC 81 mg tablet Take 81 mg by mouth daily. Take with food.   ??? atorvastatin (LIPITOR) 20 mg tablet Take 20 mg by mouth at bedtime daily.   ??? bisacodyl (DULCOLAX) 5 mg tablet Take 1 Tab by mouth twice daily.   ??? clonazePAM (KLONOPIN) 0.5 mg tablet Take one tablet by mouth daily.   ??? clonazePAM (KLONOPIN) 1 mg tablet Take one tablet by mouth daily.   ??? DOCOSAHEXANOIC ACID/EPA (FISH OIL PO) Take 2 Caps by mouth daily.   ??? docusate (COLACE) 100 mg capsule Take 100 mg by mouth twice daily.   ??? glucosamine(+) 500 mg tab Take 500 mg by mouth daily.   ??? lamoTRIgine (LAMICTAL) 100 mg tablet Take 1.5 tablets by mouth twice daily.   ??? levothyroxine (SYNTHROID) 88 mcg tablet Take 88 mcg by mouth daily.   ??? lipase-protease-amylase (CREON 24000) 24000-76000-120000 units capsule Take three capsules by mouth three times daily with meals.   ??? lithium carbonate (ESKALITH) 150 mg capsule Take 2 capsules by mouth every morning and 3 caps in the evening. Take with food.   ??? lubiprostone (AMITIZA) 24 mcg cap Take one capsule by mouth twice daily with meals. Indications: irritable bowel syndrome with constipation   ??? magnesium citrate oral solution Take 296 mL by mouth as Needed.   ??? magnesium 400 mEq.   ??? OLANZapine (ZYPREXA ZYDIS) 5 mg rapid dissolve tablet Dissolve one tablet by mouth daily as needed.   ??? OLANZapine (ZYPREXA) 10 mg tablet Take 1 tablet by mouth in the morning and 1 tablet between noon and 2pm Instructed her not to exceed 30 mg of Olanzapine daily  - Continue Seroquel 400/400/600 mg  - Continue Clonazepam 0.5 mg /1 mg qhs  - Continue Lithium 300 mg Qam and 450 Qhs  - Continue Lamictal 150 mg bid  - Continue Temazepam 30 mg Qhs  - Labs reviewed.  Metabolic monitoring:  CHOL 118, TG 72, HDL 44, LDL 58 (06/2018)  - Lithium monitoring:  Cr 1.26 (up from 1.17), Gfr 60 (down from >60), Lithium 0.9 (07/02/18); TSH 1.9 (12/2017)   - Lithium level has remained 0.8-1.0 since 04/2017  - Continue case management with Ericka Pontiff.  Continue day programming      Discussed risks/benefits/alternatives of the above treatment with the patient/patient's guardian who provided consent.    Visit Disposition     Dispositions Check-out Note    ??? Return in about 8 weeks (around 11/26/2018) for 30 minutes.   Overbook if necessary          Call clinic with questions or concerns.  Should suicidal thoughts, intentions or plans or homicidal thoughts, intentions or plans develop and/or worsen, please utilize one of the following resources including 911, ER, mental health provider or crisis hotline.    The patient was seen and discussed with Dr. Alphonsus Sias     Patient Instructions   Please cut the Zyprexa 10 mg in the  morning in half and give 5 mg in the morning for the next 5 days. On day 6, discontinue the morning dosing.    Start the Propranolol 10 mg twice daily.    We will contact you in 2 weeks to see how things are going.    Continue all other medications as normal.    Return to clinic in 2 months.  Please call the clinic to reschedule or cancel appointments if somethings changes.     If you need a medication refill, please call your pharmacy to request refills.     In the event of a safety concern or suicidal thoughts, call 911 or go to the nearest emergency room. National Suicide Prevention Lifeline 254-604-2922 (Talk). Crisis Text Hotline (text 561-522-6649).     The [facility] [employer] phone line is a resource for talk support in

## 2018-10-02 DIAGNOSIS — K5904 Chronic idiopathic constipation: Principal | ICD-10-CM

## 2018-10-02 DIAGNOSIS — F902 Attention-deficit hyperactivity disorder, combined type: ICD-10-CM

## 2018-10-02 DIAGNOSIS — K6389 Other specified diseases of intestine: ICD-10-CM

## 2018-10-02 DIAGNOSIS — R634 Abnormal weight loss: ICD-10-CM

## 2018-10-02 DIAGNOSIS — F250 Schizoaffective disorder, bipolar type: ICD-10-CM

## 2018-10-03 ENCOUNTER — Encounter: Admit: 2018-10-03 | Discharge: 2018-10-03 | Payer: MEDICARE

## 2018-10-03 MED ORDER — TEMAZEPAM 30 MG PO CAP
30 mg | ORAL_CAPSULE | Freq: Every evening | ORAL | 2 refills | Status: AC | PRN
Start: 2018-10-03 — End: 2018-11-26
  Filled 2018-10-03: qty 30, 30d supply

## 2018-10-17 ENCOUNTER — Encounter: Admit: 2018-10-17 | Discharge: 2018-10-17 | Payer: MEDICARE

## 2018-10-23 ENCOUNTER — Encounter: Admit: 2018-10-23 | Discharge: 2018-10-23 | Payer: MEDICARE

## 2018-10-28 ENCOUNTER — Encounter: Admit: 2018-10-28 | Discharge: 2018-10-28 | Payer: MEDICARE

## 2018-10-28 MED ORDER — CLONAZEPAM 0.5 MG PO TAB
.5 mg | ORAL_TABLET | Freq: Every day | ORAL | 2 refills | Status: AC
Start: 2018-10-28 — End: 2018-11-26

## 2018-10-29 ENCOUNTER — Encounter: Admit: 2018-10-29 | Discharge: 2018-10-29 | Payer: MEDICARE

## 2018-11-03 ENCOUNTER — Encounter: Admit: 2018-11-03 | Discharge: 2018-11-03 | Payer: MEDICARE

## 2018-11-04 MED ORDER — OLANZAPINE 10 MG PO TAB
ORAL_TABLET | ORAL | 0 refills | 30.00000 days | Status: DC
Start: 2018-11-04 — End: 2018-11-26

## 2018-11-06 ENCOUNTER — Encounter: Admit: 2018-11-06 | Discharge: 2018-11-06 | Payer: MEDICARE

## 2018-11-07 MED ORDER — CLONAZEPAM 1 MG PO TAB
1 mg | ORAL_TABLET | Freq: Every day | ORAL | 0 refills | Status: DC
Start: 2018-11-07 — End: 2018-11-26

## 2018-11-26 ENCOUNTER — Encounter: Admit: 2018-11-26 | Discharge: 2018-11-26 | Payer: MEDICARE

## 2018-11-26 ENCOUNTER — Ambulatory Visit: Admit: 2018-11-26 | Discharge: 2018-11-27 | Payer: MEDICARE

## 2018-11-26 DIAGNOSIS — F250 Schizoaffective disorder, bipolar type: Principal | ICD-10-CM

## 2018-11-26 DIAGNOSIS — F902 Attention-deficit hyperactivity disorder, combined type: Secondary | ICD-10-CM

## 2018-11-26 MED ORDER — OLANZAPINE 5 MG PO TAB
5 mg | ORAL_TABLET | Freq: Every morning | ORAL | 1 refills | 30.00000 days | Status: DC
Start: 2018-11-26 — End: 2019-05-22

## 2018-11-26 MED ORDER — CLONAZEPAM 1 MG PO TAB
1 mg | ORAL_TABLET | Freq: Every day | ORAL | 0 refills | Status: DC
Start: 2018-11-26 — End: 2019-06-17

## 2018-11-26 MED ORDER — CLONAZEPAM 0.5 MG PO TAB
.5 mg | ORAL_TABLET | Freq: Every morning | ORAL | 2 refills | Status: DC
Start: 2018-11-26 — End: 2019-06-04

## 2018-11-26 MED ORDER — LITHIUM CARBONATE 150 MG PO CAP
ORAL_CAPSULE | ORAL | 1 refills | 90.00000 days | Status: DC
Start: 2018-11-26 — End: 2019-08-10

## 2018-11-26 MED ORDER — LAMOTRIGINE 100 MG PO TAB
150 mg | ORAL_TABLET | Freq: Two times a day (BID) | ORAL | 1 refills | Status: DC
Start: 2018-11-26 — End: 2019-04-21

## 2018-11-26 MED ORDER — QUETIAPINE 300 MG PO TAB
ORAL_TABLET | 4 refills | Status: DC
Start: 2018-11-26 — End: 2019-08-28

## 2018-11-26 MED ORDER — OLANZAPINE 5 MG PO TBDI
5 mg | ORAL_TABLET | Freq: Every day | ORAL | 0 refills | 30.00000 days | Status: DC | PRN
Start: 2018-11-26 — End: 2019-08-28

## 2018-11-26 MED ORDER — OLANZAPINE 20 MG PO TAB
20 mg | ORAL_TABLET | Freq: Every evening | ORAL | 2 refills | 30.00000 days | Status: DC
Start: 2018-11-26 — End: 2019-08-28

## 2018-11-26 MED ORDER — QUETIAPINE 100 MG PO TAB
ORAL_TABLET | 4 refills | Status: DC
Start: 2018-11-26 — End: 2019-08-28

## 2018-11-26 MED ORDER — TEMAZEPAM 30 MG PO CAP
30 mg | ORAL_CAPSULE | Freq: Every evening | ORAL | 2 refills | Status: DC | PRN
Start: 2018-11-26 — End: 2019-03-05

## 2018-11-26 NOTE — Progress Notes
Telehealth Visit Note (patient not seen in clinic due to current COVID 19 pandemic)    Obtained patient's verbal consent to treat them and their agreement to [facility] financial policy and NPP via this telehealth visit during the [facility] Emergency.    Interview conducted via telephone today    Start time:  1518  End time:  1530    11/26/2018     Chief Complaint   Patient presents with   ??? Schizophrenia     Subjective     History of Present Illness  Chad Randall. is a 53 y.o. male with a past medical history significant for schizoaffective disorder, bipolar type, atypical autism, ADHD, mild intellectual disability, and aggression presenting for follow up.  Last seen on 10/01/18 where Propranolol 10 mg bid was started for akithisia.  Zyprexa 10 mg Qam was also discontinued but PRN dosing was allowed more liberally.    Spoke with his mother Kathie Rhodes today.    Since the last visit, she says He has been on vacation.  That's how we've been dealing with.  She denies any dizziness but admits one fall since the last visit.  She explains it was volitional as he has in the past.  She says he has been sleeping better and less agitated since decreasing Zyprexa.  She says He tell me he still feels jittery.  She says he has been endorsing less AH (voice of the devil) since the last appointment      Past Psychiatric History:   UKP since prior to 2009  Hospitalizations:  3x to [facility], last 2015  Suicide attempts:  denies  ???  Past Medication Trials:   Has been off Lithium in the past things got worse  Clozaril (neutropenia); Depakote; Loxapine; Trileptal; Ritalin(for several years as a child); Elavil (worse), Trileptal, Strattera-arm dystonias, Flurazepam, Ambien, Melatonin Wellbutrin, tegretol and Neurontin d/c recently)  ???  Social History:   Social history updated and reviewed.   He lives with mother, father, brother (2 years younger)  He attends day program: M,W,F for a workshop. ???Workshop recently lost its ??? propranoloL (INDERAL) 10 mg tablet Take one tablet by mouth twice daily.   ??? QUEtiapine (SEROQUEL) 100 mg tablet Take one tab in the morning (with 300mg  for total dose 400mg ) and one tab at 2pm (with 300mg  for total dose 400mg )   ??? QUEtiapine (SEROQUEL) 300 mg tablet Take one tab in the morning (with 100mg  tab for total dose 400mg ), one tab at 2pm (with 100mg  tab for total dose 400mg ), and two tabs at HS   ??? senna (SENOKOT) 8.6 mg tablet Take 2 Tabs by mouth twice daily.   ??? temazepam (RESTORIL) 30 mg capsule Take one capsule by mouth at bedtime as needed.     Medications Discontinued During This Encounter   Medication Reason   ??? OLANZapine (ZYPREXA) 10 mg tablet      Allergies   Allergen Reactions   ??? Diphenhydramine-Zinc Acetate AGITATION     Patient becomes agiitated and combative   ??? Haloperidol Lactate AGITATION     Severe agitation per mother   ??? Albuterol SEE COMMENTS     Extreme hyperactivity-per mom   ??? Amitriptyline SEE COMMENTS     Uncontrollable muscle movements per mother   ??? Antihistamine-1 AGITATION     Patient becomes agiitated and combative   ??? Atomoxetine UNKNOWN   ??? Bee Sting [Venom-Honey Bee] UNKNOWN   ??? Chlorpheniram-Dm-Acetaminophen AGITATION     Patient becomes  agiitated and combative   ??? Clemastine UNKNOWN   ??? Clozaril [Clozapine] AGITATION     Extreme anger/agitation   ??? Depakote [Divalproex] SEE COMMENTS     Per mother worked well to control behavior, but caused liver tests to become abnormal so was stopped.     ??? Erythromycin RASH     Allergy recorded in SMS: E-MYCIN~Reactions: RASH   ??? Flurazepam UNKNOWN   ??? Melatonin AGITATION   ??? Phenylpropanolamine UNKNOWN   ??? Pseudoephedrine UNKNOWN   ??? Tripelennamine UNKNOWN   ??? Triprolidine UNKNOWN   ??? Zolpidem AGITATION     Vitals:    11/26/18 1356   Weight: 59 kg (130 lb)   Height: 167.6 cm (66)     Body mass index is 20.98 kg/m???.  Facility age limit for growth percentiles is 20 years.    Physical Exam  Psychiatric: Comments: MENTAL STATUS EXAMINATION  - Mood: mother states stable  - Affect: mother states decreased irritability  - Thought Process: Unable to assess due to patient condition  - Thought Content: Unable to assess due to patient condition  - Perception: Unable to assess due to patient condition  - Associations: Unable to assess due to patient condition  - Insight/Judgment: limited/fair              Metabolic monitoring:     Body mass index is 20.98 kg/m???.  Wt Readings from Last 3 Encounters:   11/26/18 59 kg (130 lb)   10/01/18 61.2 kg (135 lb)   10/01/18 60.6 kg (133 lb 11.2 oz)     BP Readings from Last 3 Encounters:   10/01/18 (!) 150/47   10/01/18 (!) 145/93   07/02/18 124/86     Lab Results   Component Value Date    CHOL 118 07/02/2018    TRIG 72 07/02/2018    HDL 44 07/02/2018    LDL 58 07/02/2018    VLDL 14 07/02/2018    NONHDLCHOL 74 07/02/2018    CHOLHDLC 3 08/02/2016     Hemoglobin A1C   Date Value Ref Range Status   07/02/2018 5.3 4.0 - 6.0 % Final     Comment:     The ADA recommends that most patients with type 1 and type 2 diabetes maintain   an A1c level <7%.         AIMS  AIMS Score (Calculated): 0 (10/03/2018  8:00 AM)      Assessment:  1. Schizoaffective disorder, bipolar type (HCC)     2. ADHD (attention deficit hyperactivity disorder), combined type         Plan:  - Decrease Zyprexa 10 mg Qnoon + 20 mg Qhs to 5 mg Qam and 20 mg Qhs (5 mg dosage decrease)               - Zydis PRN usage has been about once weekly  - Continue Seroquel 400/400/600 mg  - Continue Clonazepam???0.5 mg / 1 mg Qhs  - Continue Lithium 300 mg Qam and 450 Qhs  -???Continue Lamictal 150 mg bid  - Continue Temazepam 30???mg Qhs  - Continue Propranolol 10 mg BID  - Labs reviewed.  Metabolic monitoring:  CHOL 118, TG 72, HDL 44, LDL 58 (06/2018)  - Lithium monitoring:  Cr 1.26 (up from 1.17), Gfr 60 (down from >60), Lithium 0.9 (07/02/18); TSH 1.9 (12/2017)               - Lithium level has remained 0.8-1.0 since 04/2017

## 2018-11-26 NOTE — Patient Instructions
We are going to reduce his Zyprexa to 5 mg in the morning and 20 mg at night.    All other medication will remain the same.

## 2018-12-02 ENCOUNTER — Encounter: Admit: 2018-12-02 | Discharge: 2018-12-02 | Payer: MEDICARE

## 2018-12-03 MED ORDER — TEMAZEPAM 15 MG PO CAP
15 mg | ORAL_CAPSULE | Freq: Every evening | ORAL | 1 refills | Status: DC
Start: 2018-12-03 — End: 2019-02-10

## 2018-12-31 ENCOUNTER — Encounter: Admit: 2018-12-31 | Discharge: 2018-12-31

## 2018-12-31 MED ORDER — PROPRANOLOL 10 MG PO TAB
10 mg | ORAL_TABLET | Freq: Two times a day (BID) | ORAL | 2 refills | Status: DC
Start: 2018-12-31 — End: 2019-03-25

## 2018-12-31 NOTE — Telephone Encounter
Called and spoke with pt mother to let her know that coming for his visit is a possibility at this time. The would still like to keep the appointment as a telephone appointment for now for safety purposes.

## 2018-12-31 NOTE — Telephone Encounter
LOV 11/26/18, upcoming appt 02/25/19; med refill completed per protocol.

## 2019-01-01 ENCOUNTER — Encounter: Admit: 2019-01-01 | Discharge: 2019-01-01

## 2019-01-01 ENCOUNTER — Ambulatory Visit: Admit: 2019-01-01 | Discharge: 2019-01-02

## 2019-01-01 DIAGNOSIS — M6289 Other specified disorders of muscle: Secondary | ICD-10-CM

## 2019-01-01 DIAGNOSIS — D72819 Decreased white blood cell count, unspecified: Secondary | ICD-10-CM

## 2019-01-01 DIAGNOSIS — Z79899 Other long term (current) drug therapy: Secondary | ICD-10-CM

## 2019-01-01 DIAGNOSIS — R21 Rash and other nonspecific skin eruption: Secondary | ICD-10-CM

## 2019-01-01 DIAGNOSIS — K5900 Constipation, unspecified: Secondary | ICD-10-CM

## 2019-01-01 DIAGNOSIS — F79 Unspecified intellectual disabilities: Secondary | ICD-10-CM

## 2019-01-01 DIAGNOSIS — A048 Other specified bacterial intestinal infections: Secondary | ICD-10-CM

## 2019-01-01 DIAGNOSIS — K6389 Other specified diseases of intestine: Secondary | ICD-10-CM

## 2019-01-01 DIAGNOSIS — K5904 Chronic idiopathic constipation: Secondary | ICD-10-CM

## 2019-01-01 DIAGNOSIS — Z9181 History of falling: Secondary | ICD-10-CM

## 2019-01-01 DIAGNOSIS — G249 Dystonia, unspecified: Secondary | ICD-10-CM

## 2019-01-01 DIAGNOSIS — R12 Heartburn: Secondary | ICD-10-CM

## 2019-01-01 DIAGNOSIS — R634 Abnormal weight loss: Secondary | ICD-10-CM

## 2019-01-01 DIAGNOSIS — F840 Autistic disorder: Secondary | ICD-10-CM

## 2019-01-01 DIAGNOSIS — R51 Headache: Secondary | ICD-10-CM

## 2019-01-01 DIAGNOSIS — K56609 Unspecified intestinal obstruction, unspecified as to partial versus complete obstruction: Secondary | ICD-10-CM

## 2019-01-01 DIAGNOSIS — R55 Syncope and collapse: Secondary | ICD-10-CM

## 2019-01-01 DIAGNOSIS — E039 Hypothyroidism, unspecified: Secondary | ICD-10-CM

## 2019-01-01 NOTE — Telephone Encounter
Called and spoke with pt pharmacy regarding Amitiza prescription. Amitiza PA denied and cost of medication would be $395 for a 30-day supply. [facility] pharmacy plan Member ID [id number].     [facility] pharmacy benefits. PA is more than 22 days old. New PA needed at this time. PA completed Ref # [id number]. Will fax response in 24-72 hours to [phone]    Medications on pt formulary: lactulose, linzess, movantik, and relistor.   Pt has failed lactulose and linzess at this time.    Total time spent in calls: 30 mins

## 2019-01-01 NOTE — Progress Notes
Telephone Visit Note    Date of Service: 01/01/2019    Subjective:      Obtained patient's verbal consent to treat them and their agreement to [name] financial policy and NPP via this telehealth visit during the [name]       Chad Randall. is a 53 y.o. male.    History of Present Illness    Chad Randall is a well established patient of Dr. Jomarie Longs who I have seen regularly over the years. He last saw Dr. Jomarie Longs 10/01/2018. He has a history of chronic constipation with documented pelvic floor dysfunction and prior treatment for small bowel bacterial overgrowth.  He is not able to undergo pelvic floor retraining.  He is not felt to be a person who would do well at all with an end colostomy.  He is on an aggressive bowel regimen including Colace 2 tablets twice a day, Dulcolax 2 tablets twice a day, double dose MiraLAX twice a day and Linzess 290 mcg daily.  His last visit they were going to discontinue Linzess and try Amitiza 24 mcg twice a day but insurance would not cover this.  He has also had a gradual weight loss since 2016 when he weighed 154 pounds. He is undergone extensive testing outlined below.  There is no evidence for a malignant process.  He is on thyroid replacement under the direction of his primary care physician.    Visiting with patient and his mother, Chad Randall, via telephone, for a 3 month follow up. Amitiza still not covered by insurance. Chad Randall is not sure the status of the Amitiza. Current bowel regimen is Linzess 290 mcg qd, Dulcolax 2 bid , Colace 2 bid, Mag Citrate prn - every few weeks, which does help. CBP is q 3-4 days, often rocks, minimal liquid stool. Mom got liquid suppository recently which is used prn and can sometimes be helpful. Good appetite. Weight down 1-2 pounds. Some nonspecific abdominal pain at times. Does seem to have less complaints of abdominal pain after a good BM. No vomiting. No reports of rectal bleeding, melena, or dysphagia. He does need to take Creon before meals and snacks.  His mom feels this does help him with digestion.    Previous testing:  - Labs drawn on June 27, 2017 revealed a normal chemistry panel, complete blood count, random cortisol, TSH. ???He has had a normal a.m. cortisol in the past. ???Hepatitis B and C and HIV have been checked and are all negative. ???CT of the chest on June 28, 2017,???as well as CT of the abdomen and pelvis performed on June 01, 2017 been normal without evidence for malignancy.   - Colonoscopy June 17, 2017 was normal. ???  - Endoscopic ultrasound with EGD on the same date were essentially normal. ???Pancreas was normal in appearance. ???He did have H. pylori gastritis and this was treated with antibiotics.  ???  More recent testing in February 2019 revealed normal thyroid ultrasound, normal abdominal and mesenteric Doppler ultrasound, and normal cosyntropin stimulation test.  ???  He has been tried on lactulose in the past but this resulted in a lot of bloating and cramping and did not seem to help from a bowel movement standpoint.  ???  PAST MEDICAL HISTORY:   1. Chronic constipation with evidence pelvic floor dysfunction (ARM ).???Pelvic floor testing with anorectal manometry was performed. Given Chad Randall's mental capacity this was somewhat limited testing and was felt to be inconclusive. Sitz marker study was performed. Day five x-ray  revealed 24 markers spread throughout the colon.   2. Autism.  3. Bipolar disorder.  4. Heartburn.  5. Hypothyroidism, on replacement therapy.  6. History small bowel obstruction February 2015.  7. Normal CT A/P 08/07/16.  8. Normal colonoscopy???06/17/17.  9. Normal EGD, EUS 06/17/17.  10. H pylori gastritis, RX 05/2017.  11. SIBO 11/2017 (Flagyl).  ???  FAMILY HISTORY:??????maternal aunt???colon cancer. ???No other GI cancers.                  Review of Systems   Constitutional: Positive for unexpected weight change. Negative for appetite change, chills, diaphoresis, fatigue and fever.   HENT: Positive for postnasal drip, sinus pressure and sinus pain. Negative for mouth sores, sore throat, trouble swallowing and voice change.    Eyes: Negative.  Negative for pain and visual disturbance.   Respiratory: Positive for choking.    Cardiovascular: Negative.  Negative for chest pain and leg swelling.   Gastrointestinal: Positive for constipation. Negative for abdominal distention, abdominal pain, anal bleeding, blood in stool, diarrhea, nausea, rectal pain and vomiting.   Endocrine: Negative.    Genitourinary: Positive for decreased urine volume and difficulty urinating. Negative for flank pain.   Musculoskeletal: Positive for back pain and gait problem. Negative for arthralgias.   Skin: Negative.  Negative for rash.   Allergic/Immunologic: Negative.    Neurological: Positive for speech difficulty and headaches. Negative for light-headedness.   Hematological: Negative.  Negative for adenopathy.   Psychiatric/Behavioral: Positive for behavioral problems.   All other systems reviewed and are negative.        Objective:         ??? aspirin EC 81 mg tablet Take 81 mg by mouth daily. Take with food.   ??? atorvastatin (LIPITOR) 20 mg tablet Take 20 mg by mouth at bedtime daily.   ??? bisacodyl (DULCOLAX) 5 mg tablet Take 1 Tab by mouth twice daily.   ??? clonazePAM (KLONOPIN) 0.5 mg tablet Take one tablet by mouth every morning.   ??? clonazePAM (KLONOPIN) 1 mg tablet Take one tablet by mouth daily.   ??? DOCOSAHEXANOIC ACID/EPA (FISH OIL PO) Take 2 Caps by mouth daily.   ??? docusate (COLACE) 100 mg capsule Take 100 mg by mouth twice daily.   ??? glucosamine(+) 500 mg tab Take 500 mg by mouth daily.   ??? lamoTRIgine (LAMICTAL) 100 mg tablet Take 1.5 tablets by mouth twice daily.   ??? levothyroxine (SYNTHROID) 88 mcg tablet Take 88 mcg by mouth daily.   ??? lipase-protease-amylase (CREON 24000) 24000-76000-120000 units capsule Take three capsules by mouth three times daily with meals.   ??? lithium carbonate (ESKALITH) 150 mg capsule Take 2 capsules by mouth every morning and 3 caps in the evening. Take with food.   ??? lubiprostone (AMITIZA) 24 mcg cap Take one capsule by mouth twice daily with meals. Indications: irritable bowel syndrome with constipation   ??? magnesium citrate oral solution Take 296 mL by mouth as Needed.   ??? magnesium 400 mEq.   ??? OLANZapine (ZYPREXA ZYDIS) 5 mg rapid dissolve tablet Dissolve one tablet by mouth daily as needed.   ??? OLANZapine (ZYPREXA) 20 mg tablet Take one tablet by mouth at bedtime daily.   ??? OLANZapine (ZYPREXA) 5 mg tablet Take one tablet by mouth every morning.   ??? polyethylene glycol 3350 (GLYCOLAX; MIRALAX) 17 gram/dose powder Take 17 g by mouth twice daily.   ??? propranoloL (INDERAL) 10 mg tablet Take one tablet by mouth twice daily.   ???  QUEtiapine (SEROQUEL) 100 mg tablet Take one tab in the morning (with 300mg  for total dose 400mg ) and one tab at 2pm (with 300mg  for total dose 400mg )   ??? QUEtiapine (SEROQUEL) 300 mg tablet Take one tab in the morning (with 100mg  tab for total dose 400mg ), one tab at 2pm (with 100mg  tab for total dose 400mg ), and two tabs at HS   ??? senna (SENOKOT) 8.6 mg tablet Take 2 Tabs by mouth twice daily.   ??? temazepam (RESTORIL) 15 mg capsule Take one capsule by mouth at bedtime daily. Take (1) 15mg  capsule with 30mg  for total daily dose of 45mg    ??? temazepam (RESTORIL) 30 mg capsule Take one capsule by mouth at bedtime as needed.     Vitals:    01/01/19 0918   BP: (!) 119/91   BP Source: Arm, Left Upper   Patient Position: Sitting   Pulse: 81   Weight: 58.2 kg (128 lb 6.4 oz)   Height: 167.6 cm (65.98)     Body mass index is 20.73 kg/m???.     Physical Exam    N/A     Assessment and Plan:    1. ???Unintentional weight loss with preserved appetite.He has undergone an extensive workup. There is no evidence for a malignant process.  He is on thyroid replacement under the direction of his primary care physician. There has been no obvious GI explanation for his ongoing weight loss. ???  2. 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  He is likely not a good candidate for pelvic floor retraining as he would have difficulty understanding in order to participate.  Also, diversion also has not been felt to be in the best interest of Chad Randall as his mom does not feel he could psychologically handle a diverting ostomy.  3. ???Heartburn. No current symptoms.   4. ???SIBO.  ???  At the present time I recommend the following:  ???  1. Continue current bowel regimen of Linzess 290 mcg qd, Dulcolax 2 tablets twice daily, Colace 2 tablets twice daily, and as needed mag citrate and enemas.  I will have my nurse follow-up again to see if we can get Amitiza 24 mcg twice daily covered to replace Linzess to see if more beneficial.  2. Continue Creon 24,000 units of lipase, 3 with meals.  3. Continue diet as tolerated with increased in caloric intake.  Monitor weight.  Continue close follow-up with PCP regarding thyroid replacement management.  4. Follow up in the GI Clinic in 3 months. Call in the interim with any troubles.      Patient and his mother were advised of the plan and voiced agreement and understanding.     This note was in part completed with Dragon, a voice to text dictation system. Some errors may have occured and persist despite my best efforts to edit this document to eliminate dictation/translation???related errors.??? If you have questions/concerns, please contact me for clarification                             22 minutes spent on this patient's encounter with counseling and coordination of care taking >50% of the visit.

## 2019-02-09 ENCOUNTER — Encounter: Admit: 2019-02-09 | Discharge: 2019-02-09

## 2019-02-09 NOTE — Telephone Encounter
Heidi with [facility] [place], [place] left VM at 11:00a requesting a refill on patient's temazepam 15 mg.

## 2019-02-10 MED ORDER — TEMAZEPAM 15 MG PO CAP
15 mg | ORAL_CAPSULE | Freq: Every evening | ORAL | 0 refills | Status: DC
Start: 2019-02-10 — End: 2019-02-24

## 2019-03-04 ENCOUNTER — Encounter: Admit: 2019-03-04 | Discharge: 2019-03-04

## 2019-03-05 MED ORDER — TEMAZEPAM 30 MG PO CAP
30 mg | ORAL_CAPSULE | Freq: Every evening | ORAL | 2 refills | Status: DC | PRN
Start: 2019-03-05 — End: 2019-06-10

## 2019-03-14 ENCOUNTER — Encounter: Admit: 2019-03-14 | Discharge: 2019-03-14

## 2019-03-16 ENCOUNTER — Encounter: Admit: 2019-03-16 | Discharge: 2019-03-16

## 2019-03-16 MED ORDER — CREON 24,000-76,000 -120,000 UNIT PO CPDR
ORAL_CAPSULE | Freq: Three times a day (TID) | ORAL | 5 refills | 30.00000 days | Status: DC
Start: 2019-03-16 — End: 2019-09-23

## 2019-03-16 NOTE — Telephone Encounter
Refill request received for Creon  Last OV 01/01/2019  Last fill 07/02/2018 270caps w/5 refills    Routing to Dr. Clide Cliff for approval/refusal

## 2019-03-17 MED ORDER — TEMAZEPAM 15 MG PO CAP
15 mg | ORAL_CAPSULE | Freq: Every evening | ORAL | 1 refills | Status: DC
Start: 2019-03-17 — End: 2019-04-20

## 2019-03-17 NOTE — Telephone Encounter
KTRACS was reviewed. Patient last filled temazepam on 03/05/19. Previous prescriptions had been filled significantly early.    Will provide refills to be filled on 04/05/19 and 05/05/19

## 2019-03-18 ENCOUNTER — Encounter: Admit: 2019-03-18 | Discharge: 2019-03-18

## 2019-03-25 ENCOUNTER — Encounter: Admit: 2019-03-25 | Discharge: 2019-03-25

## 2019-03-25 MED ORDER — PROPRANOLOL 10 MG PO TAB
10 mg | ORAL_TABLET | Freq: Two times a day (BID) | ORAL | 2 refills | Status: DC
Start: 2019-03-25 — End: 2019-07-01

## 2019-03-25 NOTE — Telephone Encounter
Last visit 11/26/2018  Last refilled 02/27/2019  Upcoming visit 05/15/2019  Medication refilled per protocol

## 2019-04-01 ENCOUNTER — Encounter: Admit: 2019-04-01 | Discharge: 2019-04-01

## 2019-04-01 NOTE — Progress Notes
Records Request    Medical records request for continuation of care:    Patient has appointment on 04/02/2019   with  Dr. Mickel Duhamel* .    Please fax records to [facility]  [phone]    Request records: STAT APPT [place]        Thank you,      [facility]  [facility]  [address] [place] [address]  Phone:  [phone]  Fax:  [phone]

## 2019-04-02 ENCOUNTER — Ambulatory Visit: Admit: 2019-04-02 | Discharge: 2019-04-03

## 2019-04-02 ENCOUNTER — Encounter: Admit: 2019-04-02 | Discharge: 2019-04-02

## 2019-04-02 DIAGNOSIS — Z79899 Other long term (current) drug therapy: Secondary | ICD-10-CM

## 2019-04-02 DIAGNOSIS — E039 Hypothyroidism, unspecified: Secondary | ICD-10-CM

## 2019-04-02 DIAGNOSIS — A048 Other specified bacterial intestinal infections: Secondary | ICD-10-CM

## 2019-04-02 DIAGNOSIS — R12 Heartburn: Secondary | ICD-10-CM

## 2019-04-02 DIAGNOSIS — R002 Palpitations: Secondary | ICD-10-CM

## 2019-04-02 DIAGNOSIS — G249 Dystonia, unspecified: Secondary | ICD-10-CM

## 2019-04-02 DIAGNOSIS — R21 Rash and other nonspecific skin eruption: Secondary | ICD-10-CM

## 2019-04-02 DIAGNOSIS — K5900 Constipation, unspecified: Secondary | ICD-10-CM

## 2019-04-02 DIAGNOSIS — F840 Autistic disorder: Secondary | ICD-10-CM

## 2019-04-02 DIAGNOSIS — F79 Unspecified intellectual disabilities: Secondary | ICD-10-CM

## 2019-04-02 DIAGNOSIS — R55 Syncope and collapse: Secondary | ICD-10-CM

## 2019-04-02 DIAGNOSIS — R51 Headache: Secondary | ICD-10-CM

## 2019-04-02 DIAGNOSIS — D72819 Decreased white blood cell count, unspecified: Secondary | ICD-10-CM

## 2019-04-02 DIAGNOSIS — K56609 Unspecified intestinal obstruction, unspecified as to partial versus complete obstruction: Secondary | ICD-10-CM

## 2019-04-02 DIAGNOSIS — Z9181 History of falling: Secondary | ICD-10-CM

## 2019-04-02 NOTE — Progress Notes
Date of Service: 04/02/2019    Chad Chea. is a 53 y.o. male.       HPI     Obtained patient's, or patient proxy's, verbal consent to treat them and their agreement to [facility] financial policy and NPP via this telehealth visit during the [facility] Emergency  This clinician patient interaction was conducted over the telephone. Chad Randall is followed for autism spectrum disorder, intellectual disability, and schizoaffective disorder bipolar type.  He reports infrequent momentary palpitations that now occur infrequently and are well controlled with propranolol.  They are not associated with lightheadedness, presyncope or syncope.???His mother takes???superb care of him. He had a fall on July 06, 2016 but reports no recurrence.  He has been staying indoors mostly during the COVID pandemic.  His mother indicates that he is good about wearing his mask when he leaves his home. ???Otherwise, the patient has been doing well and reports no angina, congestive symptoms, sensation of sustained forceful heart pounding, lightheadedness or syncope. His exercise tolerance has been stable. ???His mother indicates that he is generally very active and walks long distances without any difficulty. The patient reports no myalgias, bleeding abnormalities, neurologic motor abnormalities or difficulty with speech.??????He likes to watch television.  Chad Randall mother checks his blood pressure at home and his blood pressure has been very well controlled recently.  His systolic blood pressure usually runs 120-130 systolic with diastolic blood pressures of 80 mmHg.  His pulse usually runs 70-80 bpm.       Vitals:    04/02/19 1248   BP: 127/75   BP Source: Arm, Right Upper   Pulse: 73   Weight: 62 kg (136 lb 9.6 oz)   Height: 1.676 m (5' 6)   PainSc: Zero     Body mass index is 22.05 kg/m???.     Past Medical History  Patient Active Problem List    Diagnosis Date Noted   ??? Vitamin D deficiency 01/16/2018   ??? Rash 01/16/2018 ??? Muscular atrophy 01/16/2018   ??? Lower urinary tract symptoms (LUTS) 05/28/2017     Consult 05/27/17  #1 complaint - increased LUTS x 1 year  Chronic dysuria, Frequency every 30 minutes, urge and urge incontinence, occasionally wearing Depends 3-4 day, nocturia x 2  No trial of OAB medications    05/27/17   PVR 180 MLs, PVR #2 3cc         ??? Gross hematuria 05/28/2017     Intermittent gross hematuria     ??? Weight loss 09/05/2016     Added automatically from request for surgery [id number]     ??? Sleep disorder, circadian, irregular sleep-wake type 10/07/2014   ??? Heart palpitations 06/03/2014   ??? Atypical chest pain 06/03/2014   ??? Incomplete right bundle branch block 06/03/2014   ??? Autism spectrum disorder with accompanying language impairment and intellectual disability, requiring substantial support 09/08/2013   ??? Bowel obstruction (HCC) 09/08/2013   ??? Encounter for long-term (current) use of other medications 03/04/2012   ??? Constipation 10/28/2010     Working with GI  BM every 5 days       ??? Schizoaffective disorder, bipolar type (HCC) 12/22/2008   ??? 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 Delirium 07/29/2008   ??? Lithium adverse reaction 07/22/2008   ??? Acute hypernatremia 07/22/2008   ??? 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 Abdominal pain 07/22/2008   ??? Nonspecific ST-T wave electrocardiographic changes 07/22/2008   ???  Disturbance of skin sensation 08/26/2006   ??? Syncope and collapse 08/26/2006         Review of Systems   Constitution: Negative.   HENT: Positive for congestion.    Eyes: Negative.    Cardiovascular: Negative.    Respiratory: Negative.    Endocrine: Negative.    Hematologic/Lymphatic: Negative.    Skin: Positive for rash.   Musculoskeletal: Positive for joint pain.   Gastrointestinal: Positive for bloating, abdominal pain and constipation.   Genitourinary: Negative.    Neurological: Positive for disturbances in coordination and loss of balance. Psychiatric/Behavioral: Positive for altered mental status and hypervigilance. The patient is nervous/anxious.    Allergic/Immunologic: Negative.        Physical Exam  A comprehensive examination could not be performed because this clinician patient interaction was conducted over the telephone    Cardiovascular Studies    A 12-lead ECG performed on November 11, 2017 reveals normal sinus rhythm with a heart rate of 87 bpm.  Right bundle branch block is noted with a QRS duration of 120 ms.  ZIO Patch event monitor 11/11/2017.  The duration of the event monitor was 6 days and 20 hours.  The underlying heart rhythm is normal sinus rhythm with an average heart rate of 76 bpm.  The heart rate range is 51 to 130 bpm.  Rare isolated premature supraventricular ectopic beats are noted with a total of 39 recorded throughout the entire monitoring period.  There were no paired premature supraventricular ectopic beats and there was no supraventricular tachycardia seen, either nonsustained or sustained.  Rare isolated ventricular ectopy was noted with a total of 55 ventricular ectopic beats recorded throughout the entire monitoring period.  There were no paired premature ventricular ectopic beats and there was no ventricular tachycardia seen, either nonsustained or sustained.  There was no ventricular bigeminy or trigeminy seen.  There were no significant pauses noted.  No second or third-degree AV block was noted.  There was no atrial fibrillation or atrial flutter seen.  The patient did report symptoms: Symptoms of fluttering/racing/lightheadedness/dizziness were reported at 8:30 PM on 11/13/2017 and this correlated with sinus rhythm with a heart rate of 91 bpm and no arrhythmias or ectopy were noted at the time.  Symptoms of pounding/dizziness were reported at 10:17 PM on 11/14/2017.  The heart rhythm at the time was sinus rhythm with a rate of 80 bpm no arrhythmias or ectopy were noted at the time.  Symptoms of fluttering/racing/pounding/chest pain-pressure or reported at 8 AM on 11/17/2017.  The heart rhythm at the time was sinus rhythm with a rate of 83 bpm and no ectopy or arrhythmias were noted at the time.  Symptoms of fluttering/racing/dizziness/chest pain-pressure/anxiety were reported at 9:40 AM on 11/17/2017.  The heart rhythm at the time was normal sinus rhythm with a rate of 99 bpm and no ectopy or arrhythmias were noted at the time.  Symptoms of fluttering/racing/anxiety were reported at 9 AM on 11/18/2017.  The heart rhythm at the time was normal sinus rhythm with a rate of 90 bpm and no ectopy or arrhythmias were noted at the time.  A triggered event at 10:16 PM on 11/14/2017 correlated with sinus rhythm with heart rate of 77 bpm.  No ectopy or arrhythmias were noted at the time.  A triggered event at 9:41 AM on 11/17/2017 correlated with sinus rhythm with a heart rate of 98 bpm.  No ectopy or arrhythmias were noted at the time.  A triggered event at  9:10 AM on 11/18/2017 correlated with normal sinus rhythm with a heart rate of 82 bpm.  No arrhythmias or ectopy were noted at the time.    Problems Addressed Today  Palpitations  Assessment and Plan     Chad Randall appears stable from a cardiovascular perspective.  His blood pressure appears well controlled when checked at home.  His palpitations most likely are due to anxiety and a forceful contraction rather than ectopy or cardiac arrhythmias.  No changes in his medical regimen were made today. I have asked him to return for follow-up in 12 months. The time spent conducting patient evaluation, surveillance and education during this clinician patient telephone visit was 30 minutes.           Current Medications (including today's revisions)  ??? aspirin EC 81 mg tablet Take 81 mg by mouth daily. Take with food.   ??? atorvastatin (LIPITOR) 20 mg tablet Take 20 mg by mouth at bedtime daily.   ??? bisacodyl (DULCOLAX) 5 mg tablet Take 1 Tab by mouth twice daily. ??? clonazePAM (KLONOPIN) 0.5 mg tablet Take one tablet by mouth every morning.   ??? clonazePAM (KLONOPIN) 1 mg tablet Take one tablet by mouth daily. (Patient taking differently: Take 1 mg by mouth at bedtime daily.)   ??? CREON 24,000-76,000 -120,000 unit capsule TAKE 3 CAPSULES BY MOUTH THREE TIMES DAILY WITH MEALS   ??? DOCOSAHEXANOIC ACID/EPA (FISH OIL PO) Take 2 Caps by mouth daily.   ??? docusate (COLACE) 100 mg capsule Take 100 mg by mouth twice daily.   ??? glucosamine(+) 500 mg tab Take 500 mg by mouth daily.   ??? lamoTRIgine (LAMICTAL) 100 mg tablet Take 1.5 tablets by mouth twice daily.   ??? levothyroxine (SYNTHROID) 100 mcg tablet Take 100 mcg by mouth daily.   ??? lithium carbonate (ESKALITH) 150 mg capsule Take 2 capsules by mouth every morning and 3 caps in the evening. Take with food.   ??? lubiprostone (AMITIZA) 24 mcg cap Take one capsule by mouth twice daily with meals. Indications: irritable bowel syndrome with constipation   ??? magnesium citrate oral solution Take 296 mL by mouth as Needed.   ??? magnesium 400 mEq twice daily.   ??? OLANZapine (ZYPREXA ZYDIS) 5 mg rapid dissolve tablet Dissolve one tablet by mouth daily as needed.   ??? OLANZapine (ZYPREXA) 20 mg tablet Take one tablet by mouth at bedtime daily.   ??? OLANZapine (ZYPREXA) 5 mg tablet Take one tablet by mouth every morning.   ??? polyethylene glycol 3350 (GLYCOLAX; MIRALAX) 17 gram/dose powder Take 17 g by mouth twice daily.   ??? propranoloL (INDERAL) 10 mg tablet Take one tablet by mouth twice daily.   ??? QUEtiapine (SEROQUEL) 100 mg tablet Take one tab in the morning (with 300mg  for total dose 400mg ) and one tab at 2pm (with 300mg  for total dose 400mg )   ??? QUEtiapine (SEROQUEL) 300 mg tablet Take one tab in the morning (with 100mg  tab for total dose 400mg ), one tab at 2pm (with 100mg  tab for total dose 400mg ), and two tabs at HS   ??? senna (SENOKOT) 8.6 mg tablet Take 2 Tabs by mouth twice daily. ??? temazepam (RESTORIL) 15 mg capsule Take one capsule by mouth at bedtime daily. Take with 30mg  capsule for total daily dose of 45mg    ??? temazepam (RESTORIL) 30 mg capsule Take one capsule by mouth at bedtime as needed.

## 2019-04-19 ENCOUNTER — Encounter: Admit: 2019-04-19 | Discharge: 2019-04-19 | Payer: MEDICARE

## 2019-04-20 MED ORDER — TEMAZEPAM 15 MG PO CAP
ORAL_CAPSULE | Freq: Every evening | 1 refills | Status: DC
Start: 2019-04-20 — End: 2019-07-03

## 2019-04-21 ENCOUNTER — Encounter: Admit: 2019-04-21 | Discharge: 2019-04-21 | Payer: MEDICARE

## 2019-04-21 MED ORDER — LAMOTRIGINE 100 MG PO TAB
150 mg | ORAL_TABLET | Freq: Two times a day (BID) | ORAL | 1 refills | Status: DC
Start: 2019-04-21 — End: 2019-07-10

## 2019-04-21 NOTE — Telephone Encounter
Received fax requesting refill authorization for lamotrigine 100mg .   Written 11/26/18 with 1 refill  Last visit 11/26/18  Visits canceled by patient 02/25/19, 04/07/19  Upcoming visit 05/15/19

## 2019-05-22 ENCOUNTER — Encounter: Admit: 2019-05-22 | Discharge: 2019-05-22 | Payer: MEDICARE

## 2019-05-22 MED ORDER — OLANZAPINE 5 MG PO TAB
5 mg | ORAL_TABLET | Freq: Every morning | ORAL | 0 refills | 30.00000 days | Status: DC
Start: 2019-05-22 — End: 2019-08-28

## 2019-05-22 NOTE — Telephone Encounter
Last office visit 11/26/2018  Upcoming visit 08/13/2018  Medication refilled per protocol

## 2019-06-04 ENCOUNTER — Encounter: Admit: 2019-06-04 | Discharge: 2019-06-04 | Payer: MEDICARE

## 2019-06-04 MED ORDER — CLONAZEPAM 0.5 MG PO TAB
.5 mg | ORAL_TABLET | Freq: Every morning | ORAL | 2 refills | Status: DC
Start: 2019-06-04 — End: 2019-08-28

## 2019-06-05 ENCOUNTER — Encounter: Admit: 2019-06-05 | Discharge: 2019-06-05 | Payer: MEDICARE

## 2019-06-10 MED ORDER — TEMAZEPAM 30 MG PO CAP
30 mg | ORAL_CAPSULE | Freq: Every evening | ORAL | 2 refills | Status: DC | PRN
Start: 2019-06-10 — End: 2019-08-28

## 2019-06-15 ENCOUNTER — Encounter: Admit: 2019-06-15 | Discharge: 2019-06-15 | Payer: MEDICARE

## 2019-06-16 ENCOUNTER — Encounter: Admit: 2019-06-16 | Discharge: 2019-06-16 | Payer: MEDICARE

## 2019-06-16 MED ORDER — TEMAZEPAM 15 MG PO CAP
ORAL_CAPSULE | Freq: Every evening | 1 refills
Start: 2019-06-16 — End: ?

## 2019-06-17 MED ORDER — CLONAZEPAM 1 MG PO TAB
ORAL_TABLET | Freq: Every day | ORAL | 0 refills | 30.00000 days | Status: DC
Start: 2019-06-17 — End: 2019-08-28

## 2019-07-01 ENCOUNTER — Encounter: Admit: 2019-07-01 | Discharge: 2019-07-01 | Payer: MEDICARE

## 2019-07-01 DIAGNOSIS — F250 Schizoaffective disorder, bipolar type: Secondary | ICD-10-CM

## 2019-07-01 MED ORDER — PROPRANOLOL 10 MG PO TAB
10 mg | ORAL_TABLET | Freq: Two times a day (BID) | ORAL | 2 refills | Status: DC
Start: 2019-07-01 — End: 2019-08-28

## 2019-07-02 ENCOUNTER — Encounter: Admit: 2019-07-02 | Discharge: 2019-07-02 | Payer: MEDICARE

## 2019-07-03 MED ORDER — TEMAZEPAM 15 MG PO CAP
ORAL_CAPSULE | Freq: Every evening | ORAL | 1 refills | 30.00000 days | Status: DC
Start: 2019-07-03 — End: 2019-08-25

## 2019-07-09 ENCOUNTER — Encounter: Admit: 2019-07-09 | Discharge: 2019-07-09 | Payer: MEDICARE

## 2019-07-09 DIAGNOSIS — K5904 Chronic idiopathic constipation: Secondary | ICD-10-CM

## 2019-07-09 MED ORDER — LUBIPROSTONE 24 MCG PO CAP
24 ug | ORAL_CAPSULE | Freq: Two times a day (BID) | ORAL | 5 refills | 90.00000 days | Status: DC
Start: 2019-07-09 — End: 2020-01-14

## 2019-07-10 ENCOUNTER — Encounter: Admit: 2019-07-10 | Discharge: 2019-07-10 | Payer: MEDICARE

## 2019-07-10 DIAGNOSIS — F250 Schizoaffective disorder, bipolar type: Secondary | ICD-10-CM

## 2019-07-10 MED ORDER — LAMOTRIGINE 100 MG PO TAB
150 mg | ORAL_TABLET | Freq: Two times a day (BID) | ORAL | 1 refills | Status: DC
Start: 2019-07-10 — End: 2019-08-28

## 2019-07-14 ENCOUNTER — Encounter: Admit: 2019-07-14 | Discharge: 2019-07-14 | Payer: MEDICARE

## 2019-07-14 MED ORDER — OLANZAPINE 5 MG PO TAB
5 mg | ORAL_TABLET | Freq: Every morning | ORAL | 0 refills
Start: 2019-07-14 — End: ?

## 2019-08-07 ENCOUNTER — Encounter: Admit: 2019-08-07 | Discharge: 2019-08-07 | Payer: MEDICARE

## 2019-08-10 MED ORDER — LITHIUM CARBONATE 150 MG PO CAP
ORAL_CAPSULE | ORAL | 1 refills | 90.00000 days | Status: DC
Start: 2019-08-10 — End: 2019-08-28

## 2019-08-17 ENCOUNTER — Encounter: Admit: 2019-08-17 | Discharge: 2019-08-17 | Payer: MEDICARE

## 2019-08-17 DIAGNOSIS — F250 Schizoaffective disorder, bipolar type: Secondary | ICD-10-CM

## 2019-08-19 MED ORDER — PROPRANOLOL 10 MG PO TAB
10 mg | ORAL_TABLET | Freq: Two times a day (BID) | ORAL | 2 refills
Start: 2019-08-19 — End: ?

## 2019-08-23 ENCOUNTER — Encounter: Admit: 2019-08-23 | Discharge: 2019-08-23 | Payer: MEDICARE

## 2019-08-25 MED ORDER — TEMAZEPAM 15 MG PO CAP
ORAL_CAPSULE | Freq: Every evening | 1 refills | Status: DC
Start: 2019-08-25 — End: 2019-08-28

## 2019-08-26 MED ORDER — LAMOTRIGINE 100 MG PO TAB
150 mg | ORAL_TABLET | Freq: Two times a day (BID) | ORAL | 2 refills | Status: CN
Start: 2019-08-26 — End: ?

## 2019-08-27 ENCOUNTER — Encounter: Admit: 2019-08-27 | Discharge: 2019-08-27 | Payer: MEDICARE

## 2019-08-28 ENCOUNTER — Encounter: Admit: 2019-08-28 | Discharge: 2019-08-28 | Payer: MEDICARE

## 2019-08-28 MED ORDER — TEMAZEPAM 15 MG PO CAP
15 mg | ORAL_CAPSULE | Freq: Every evening | ORAL | 5 refills | Status: AC
Start: 2019-08-28 — End: ?

## 2019-08-28 MED ORDER — OLANZAPINE 5 MG PO TAB
5 mg | ORAL_TABLET | Freq: Every morning | ORAL | 1 refills | 30.00000 days | Status: DC
Start: 2019-08-28 — End: 2020-02-12

## 2019-08-28 MED ORDER — OLANZAPINE 20 MG PO TAB
20 mg | ORAL_TABLET | Freq: Every evening | ORAL | 1 refills | 30.00000 days | Status: DC
Start: 2019-08-28 — End: 2020-02-12

## 2019-08-28 MED ORDER — QUETIAPINE 300 MG PO TAB
ORAL_TABLET | 4 refills | Status: AC
Start: 2019-08-28 — End: ?

## 2019-08-28 MED ORDER — LAMOTRIGINE 150 MG PO TAB
150 mg | ORAL_TABLET | Freq: Two times a day (BID) | ORAL | 1 refills | Status: DC
Start: 2019-08-28 — End: 2020-02-12

## 2019-08-28 MED ORDER — TEMAZEPAM 30 MG PO CAP
30 mg | ORAL_CAPSULE | Freq: Every evening | ORAL | 5 refills | Status: DC | PRN
Start: 2019-08-28 — End: 2020-02-12

## 2019-08-28 MED ORDER — PROPRANOLOL 10 MG PO TAB
10 mg | ORAL_TABLET | Freq: Two times a day (BID) | ORAL | 5 refills | Status: AC
Start: 2019-08-28 — End: ?

## 2019-08-28 MED ORDER — CLONAZEPAM 1 MG PO TAB
1 mg | ORAL_TABLET | Freq: Every day | ORAL | 5 refills | Status: DC
Start: 2019-08-28 — End: 2020-02-12

## 2019-08-28 MED ORDER — TEMAZEPAM 30 MG PO CAP
30 mg | ORAL_CAPSULE | Freq: Every evening | ORAL | 2 refills | PRN
Start: 2019-08-28 — End: ?

## 2019-08-28 MED ORDER — QUETIAPINE 100 MG PO TAB
ORAL_TABLET | 4 refills | Status: AC
Start: 2019-08-28 — End: ?

## 2019-08-28 MED ORDER — OLANZAPINE 5 MG PO TBDI
5 mg | ORAL_TABLET | Freq: Every day | ORAL | 3 refills | 30.00000 days | Status: AC | PRN
Start: 2019-08-28 — End: ?

## 2019-08-28 MED ORDER — CLONAZEPAM 0.5 MG PO TAB
.5 mg | ORAL_TABLET | Freq: Every morning | ORAL | 5 refills | Status: AC
Start: 2019-08-28 — End: ?

## 2019-08-28 MED ORDER — LITHIUM CARBONATE 150 MG PO CAP
ORAL_CAPSULE | ORAL | 1 refills | 90.00000 days | Status: AC
Start: 2019-08-28 — End: ?

## 2019-09-14 ENCOUNTER — Encounter: Admit: 2019-09-14 | Discharge: 2019-09-14 | Payer: MEDICARE

## 2019-09-14 NOTE — Telephone Encounter
PA for Amitiza was attempted. A PA was already on file for this medication. Aram Beecham MA

## 2019-09-17 ENCOUNTER — Encounter: Admit: 2019-09-17 | Discharge: 2019-09-17 | Payer: MEDICARE

## 2019-09-17 DIAGNOSIS — F250 Schizoaffective disorder, bipolar type: Secondary | ICD-10-CM

## 2019-09-17 MED ORDER — CLONAZEPAM 1 MG PO TAB
ORAL_TABLET | Freq: Every day | 0 refills
Start: 2019-09-17 — End: ?

## 2019-09-23 ENCOUNTER — Encounter: Admit: 2019-09-23 | Discharge: 2019-09-23 | Payer: MEDICARE

## 2019-09-23 MED ORDER — CREON 24,000-76,000 -120,000 UNIT PO CPDR
ORAL_CAPSULE | Freq: Three times a day (TID) | ORAL | 5 refills | 30.00000 days | Status: AC
Start: 2019-09-23 — End: ?

## 2019-10-01 ENCOUNTER — Encounter: Admit: 2019-10-01 | Discharge: 2019-10-01 | Payer: MEDICARE

## 2019-10-01 NOTE — Telephone Encounter
PA for Amitiza was approved. Aram Beecham MA

## 2019-10-01 NOTE — Telephone Encounter
PA for Amitiza was submitted to [email or web address]. Key: [id number]. Can take 24-72 hours for determination. Aram Beecham MA

## 2019-10-20 ENCOUNTER — Encounter: Admit: 2019-10-20 | Discharge: 2019-10-20 | Payer: MEDICARE

## 2019-10-20 NOTE — Telephone Encounter
Mother with concerns of weight loss.  Is tolerating Amitiza but having to give enemas.  Is giving protein supplements but when eats is having stomach discomfort.  Mother is requesting appointment and agrees to be seen by Volney American, NP.  Has PCP appointment this week.

## 2019-10-29 ENCOUNTER — Encounter: Admit: 2019-10-29 | Discharge: 2019-10-29 | Payer: MEDICARE

## 2019-10-29 ENCOUNTER — Ambulatory Visit: Admit: 2019-10-29 | Discharge: 2019-10-30 | Payer: MEDICARE

## 2019-10-29 DIAGNOSIS — K219 Gastro-esophageal reflux disease without esophagitis: Secondary | ICD-10-CM

## 2019-10-29 DIAGNOSIS — R634 Abnormal weight loss: Secondary | ICD-10-CM

## 2019-10-29 DIAGNOSIS — K5900 Constipation, unspecified: Secondary | ICD-10-CM

## 2019-10-29 DIAGNOSIS — M6289 Other specified disorders of muscle: Secondary | ICD-10-CM

## 2019-10-29 DIAGNOSIS — Z79899 Other long term (current) drug therapy: Secondary | ICD-10-CM

## 2019-10-29 DIAGNOSIS — F79 Unspecified intellectual disabilities: Secondary | ICD-10-CM

## 2019-10-29 DIAGNOSIS — K6389 Other specified diseases of intestine: Secondary | ICD-10-CM

## 2019-10-29 DIAGNOSIS — Z9181 History of falling: Secondary | ICD-10-CM

## 2019-10-29 DIAGNOSIS — F840 Autistic disorder: Secondary | ICD-10-CM

## 2019-10-29 DIAGNOSIS — D72819 Decreased white blood cell count, unspecified: Secondary | ICD-10-CM

## 2019-10-29 DIAGNOSIS — E039 Hypothyroidism, unspecified: Secondary | ICD-10-CM

## 2019-10-29 DIAGNOSIS — K56609 Unspecified intestinal obstruction, unspecified as to partial versus complete obstruction: Secondary | ICD-10-CM

## 2019-10-29 DIAGNOSIS — R12 Heartburn: Secondary | ICD-10-CM

## 2019-10-29 DIAGNOSIS — A048 Other specified bacterial intestinal infections: Secondary | ICD-10-CM

## 2019-10-29 DIAGNOSIS — G249 Dystonia, unspecified: Secondary | ICD-10-CM

## 2019-10-29 DIAGNOSIS — R51 Headache: Secondary | ICD-10-CM

## 2019-10-29 DIAGNOSIS — R55 Syncope and collapse: Secondary | ICD-10-CM

## 2019-10-29 DIAGNOSIS — K5904 Chronic idiopathic constipation: Secondary | ICD-10-CM

## 2019-10-29 DIAGNOSIS — R21 Rash and other nonspecific skin eruption: Secondary | ICD-10-CM

## 2019-10-29 IMAGING — CR ABDOMEN
4 series · 4 of 4 positions shown · non-contrast
Comparison: none

[chest pa]
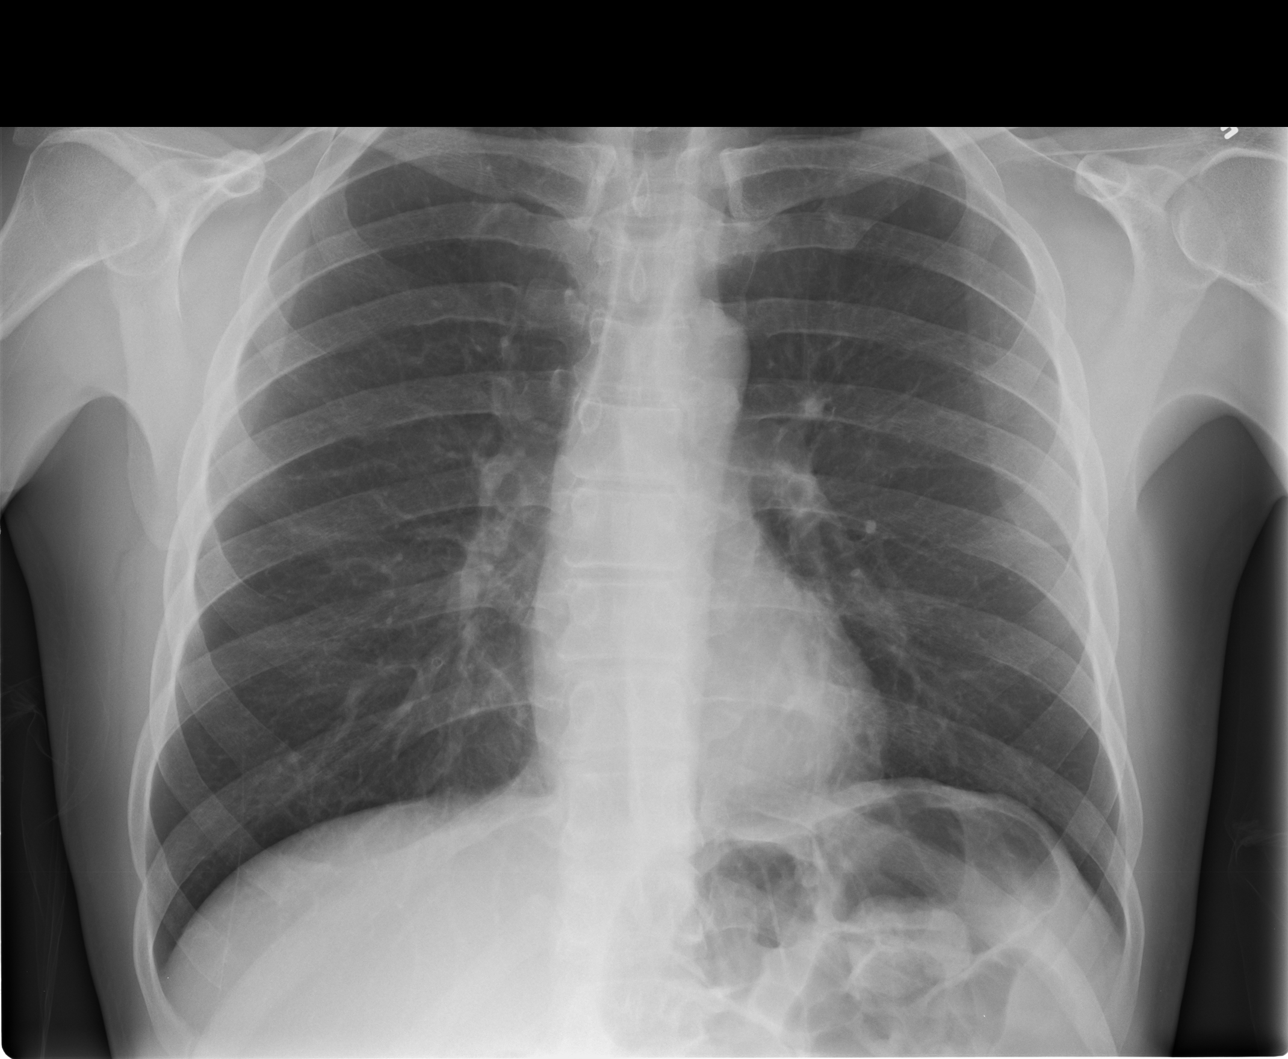

[abdomen upright]
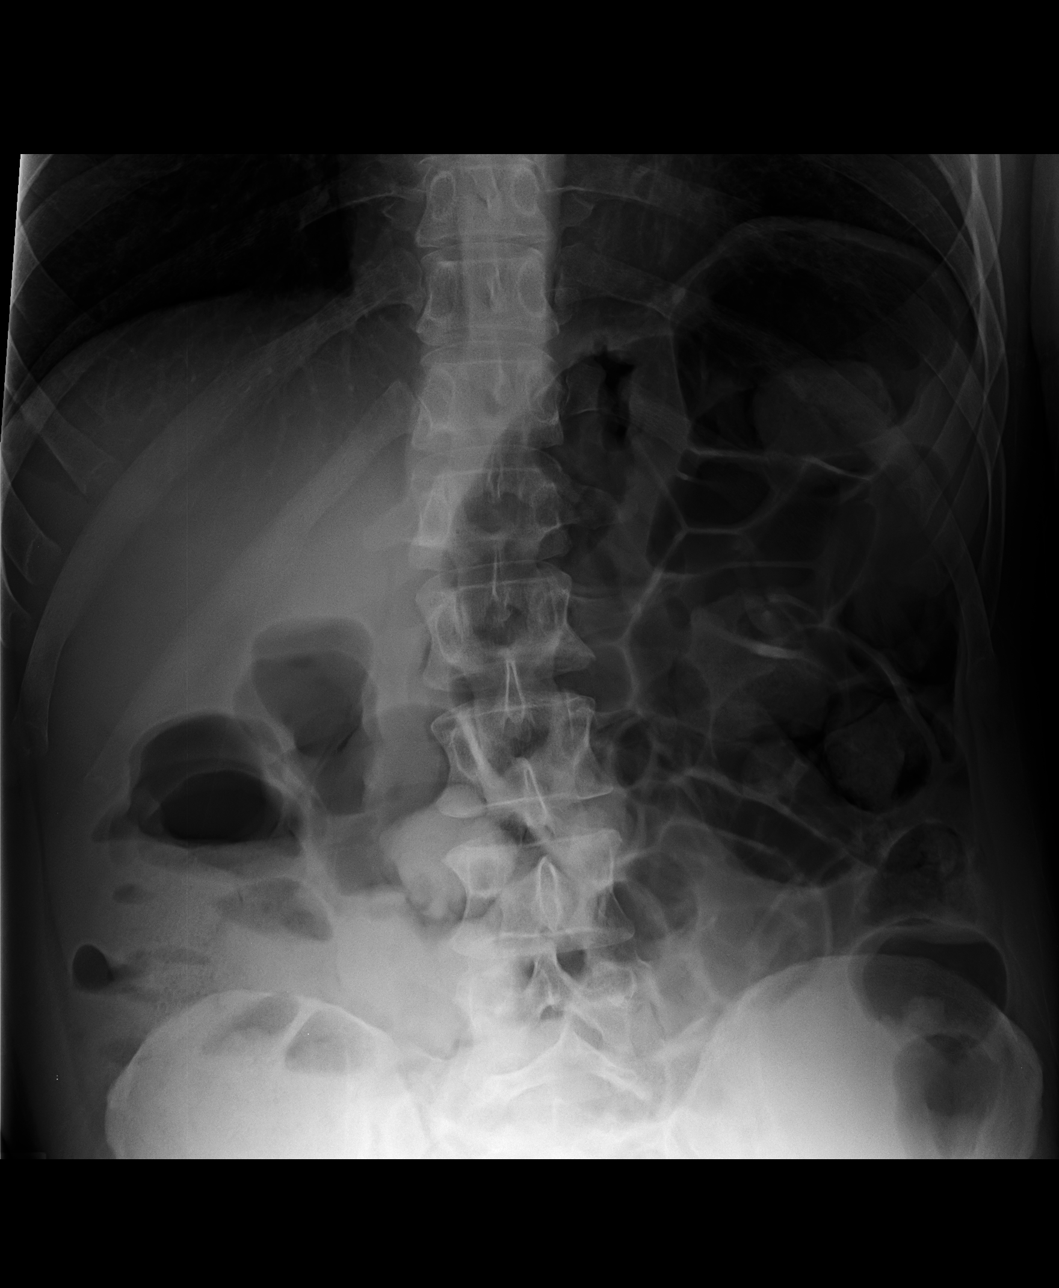

[abdomen supine kub (1 of 2)]
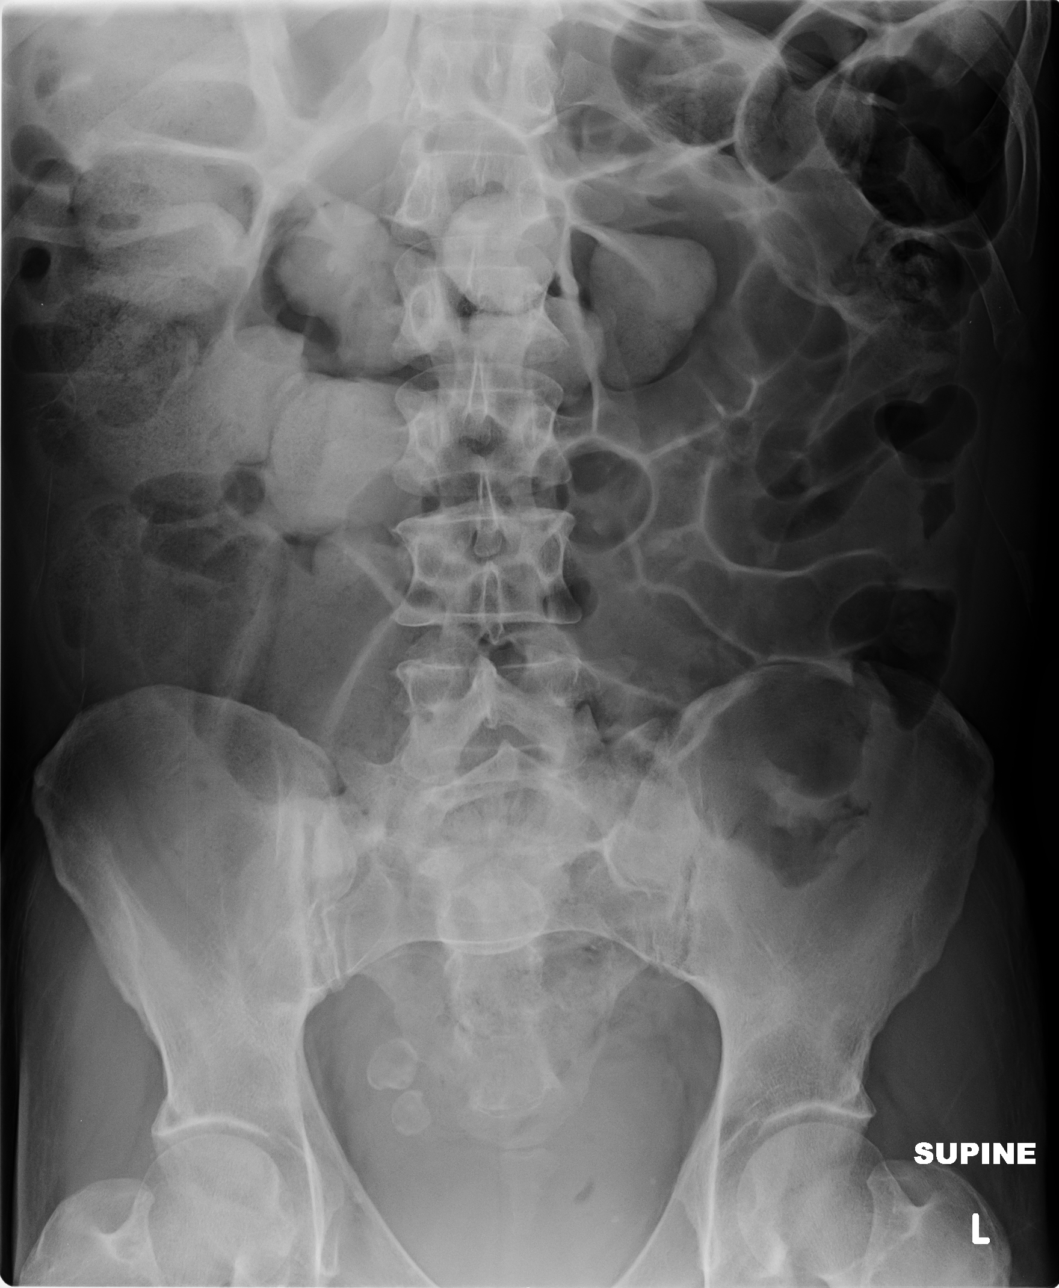

[abdomen supine kub (2 of 2)]
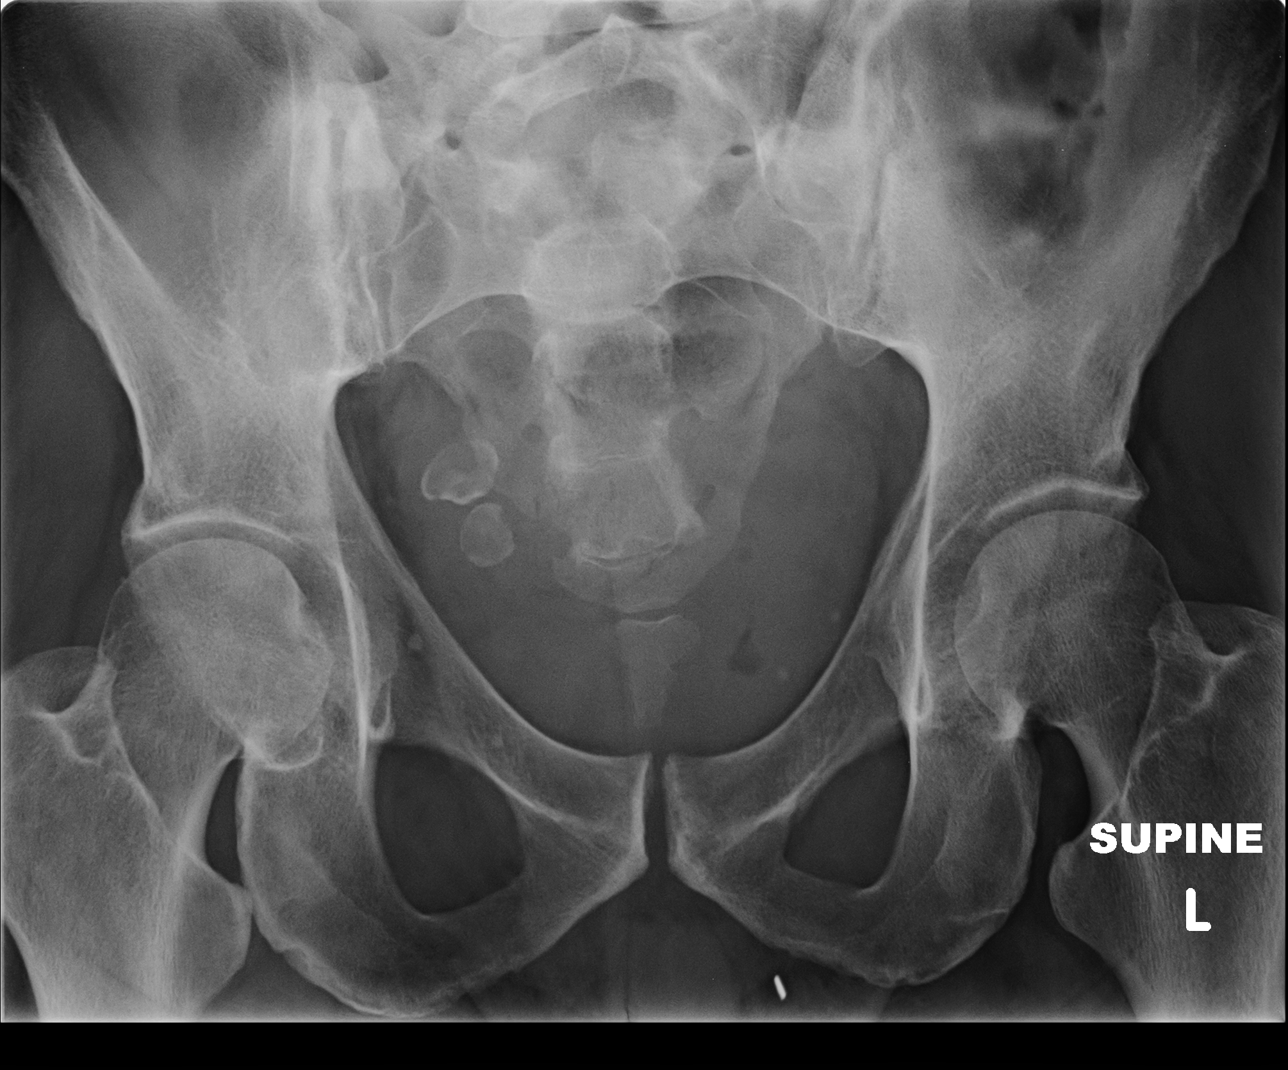

[4 of 4 positions shown; findings below may reference images not displayed]

DIAGNOSTIC STUDIES

EXAM

RADIOLOGIC EXAMINATION, ABDOMEN; 4 OR MORE VIEWS, COMPLETE ACUTE ABDOMEN SERIES, INCLUDING SUPINE,
ERECT, AND/OR DECUBITUS VIEWS, SINGLE VIEW CHEST CPT 35299

INDICATION

CONSTIPATION
PT. C/O ABDOMINAL PAIN, CONSTIPATION DIARRHEA. .

TECHNIQUE

Single frontal view of the chest along with supine and upright views of the abdomen were obtained.

COMPARISONS

07/06/2016

FINDINGS

PA CHEST: The heart and mediastinal structures are normal in size. There is no evidence of acute
consolidation, congestion, or pleural effusions.

KUB AND UPRIGHT: There is a nonspecific bowel gas pattern without evidence of obstruction or free
air. Large amount of stool within the ascending and transverse colon. There are no abnormal
calcifications.  Skeletal structures are intact.

IMPRESSION

1. Constipation.

Further evaluation with CT is suggested if clinically indicated.

## 2019-10-29 NOTE — Patient Instructions
1. Continue the Amitiza twice a day with food along with the Miralax, dulcolax, colace, and mineral oil enemas as needed.     2. Continue to supplement nutrition with protein shakes. Monitor weight.     3. Ok to continue Creon with meals.     4. Follow up as needed.     Please call my nurse Arrica if you have any questions or concerns. [phone].

## 2019-10-29 NOTE — Progress Notes
Date of Service: 10/29/2019    Subjective:             Chad Terrasi. is a 54 y.o. male.    History of Present Illness    Chad Randall is a well established patient of Dr. Jomarie Longs who I have seen regularly over the years. He last saw Dr. Jomarie Longs 10/01/2018. He has a history of chronic constipation with documented pelvic floor dysfunction and prior treatment for small bowel bacterial overgrowth. ?He is not able to undergo pelvic floor retraining. ?He is not felt to be a person who would do well at all with an end colostomy. ?He is on an aggressive bowel regimen including Colace 2 tablets twice a day, Dulcolax 2 tablets twice a day, double dose MiraLAX twice a day and Linzess 290 mcg daily. ?His last visit they were going to discontinue Linzess and try Amitiza 24 mcg twice a day but insurance would not cover this.  He has also had a gradual weight loss since 2016 when he weighed 154 pounds. He is undergone extensive testing outlined below. ?There is no evidence for a malignant process. ?He is on thyroid replacement under the direction of his primary care physician.  I visited with his mother, Chad Randall, via telephone on 01/01/2019, for 69-month follow-up visit.  The outcome of the PA for Amitiza was unclear.  He remained on Linzess 290 mcg daily, Dulcolax 2 tablets twice daily, Colace 2 tablets twice daily, and mag citrate as needed, every few weeks, which did seem to help but he was still having some intermittent problems with rocks type stool.  His weight was down 1 to 2 pounds.  He had some nonspecific abdominal pain at times, less complaints after good bowel movement.  There were no issues with vomiting, decreased p.o. intake, rectal bleeding, or melena.  He did continue to take Creon before meals and snacks as his mom felt it help with his digestion.    I am seeing him today for a follow-up visit.  He is accompanied by his mother, Chad Randall.  She states he has had increased problems with abdominal pain and constipation over the past few months but recently realized that it was likely because he ran out of Amitiza.  Prior to that, he did switch from Linzess to Amitiza 24 mcg twice a day and his constipation did seem to be better.  Last week, she decided to give him a couple mineral oil enemas and he did have massive amounts of bowel movements after that with a lot of solid stool but since then he has complained of less abdominal pain and his mom has noticed less distention.  After the large stools, he did have some blood in the stool noted although yesterday there was no blood present.  Overall, Chad Randall feels Amitiza seems to be working pretty well for his constipation.  He is also taking Colace 2 tablets twice daily, MiraLAX 2 scoops daily, and Dulcolax 2 tablets twice daily.  She does give him Dulcolax suppositories intermittently although he likes to avoid them.  He has a good appetite.  His weight is up 2 pounds since the last visit.  He remains on Creon with meals/snacks.  No vomiting, fever, chills, or melena.  He does drink Premier protein shakes fairly regularly to support his weight.  Overall, Chad Randall feels he is currently doing much better.  He is due for some lab work ordered by his psychiatrist which she will have him get done today.  This  includes CBC, CMP, and TSH.  ?  ?  Previous testing:  -?Labs drawn on June 27, 2017 revealed a normal chemistry panel, complete blood count, random cortisol, TSH. ?He has had a normal a.m. cortisol in the past. ?Hepatitis B and C and HIV have been checked and are all negative. ?CT of the chest on June 28, 2017,?as well as CT of the abdomen and pelvis performed on June 01, 2017 been normal without evidence for malignancy.   -?Colonoscopy June 17, 2017 was normal. ?  -?Endoscopic ultrasound with EGD on the same date were essentially normal. ?Pancreas was normal in appearance. ?He did have H. pylori gastritis and this was treated with antibiotics.  ?  More recent testing in February 2019 revealed normal thyroid ultrasound, normal abdominal and mesenteric Doppler ultrasound, and normal cosyntropin stimulation test.  ?  He has been tried on lactulose in the past but this resulted in a lot of bloating and cramping and did not seem to help from a bowel movement standpoint.  ?  PAST MEDICAL HISTORY:   1. Chronic constipation with evidence pelvic floor dysfunction ([facility] [place]).?Pelvic floor testing with anorectal manometry was performed. Given Orbin's mental capacity this was somewhat limited testing and was felt to be inconclusive. Sitz marker study was performed. Day five x-ray revealed 24 markers spread throughout the colon.   2. Autism.  3. Bipolar disorder.  4. Heartburn.  5. Hypothyroidism, on replacement therapy.  6. History small bowel obstruction February 2015.  7. Normal CT A/P 08/07/16.  8. Normal colonoscopy?06/17/17.  9. Normal EGD, EUS 06/17/17.  10. H pylori gastritis, RX 05/2017.  11. SIBO 11/2017 (Flagyl).  ?  FAMILY HISTORY:??maternal aunt?colon cancer. ?No other GI cancers.   ?         Review of Systems   Constitutional: Negative for appetite change, chills, diaphoresis, fatigue, fever and unexpected weight change.   HENT: Negative for mouth sores, sore throat, trouble swallowing and voice change.    Eyes: Negative for pain and visual disturbance.   Respiratory: Negative for choking.    Cardiovascular: Negative for chest pain and leg swelling.   Gastrointestinal: Positive for abdominal pain, anal bleeding, blood in stool, constipation, nausea and rectal pain. Negative for abdominal distention, diarrhea and vomiting.   Genitourinary: Positive for difficulty urinating, frequency and urgency. Negative for flank pain.   Musculoskeletal: Positive for back pain. Negative for arthralgias.   Skin: Negative for rash.   Neurological: Positive for facial asymmetry, speech difficulty and weakness. Negative for light-headedness and headaches.   Hematological: Negative for adenopathy. Psychiatric/Behavioral: Positive for agitation, confusion, decreased concentration and sleep disturbance. The patient is nervous/anxious.    All other systems reviewed and are negative.        Objective:         ? aspirin EC 81 mg tablet Take 81 mg by mouth daily. Take with food.   ? atorvastatin (LIPITOR) 20 mg tablet Take 20 mg by mouth at bedtime daily.   ? bisacodyl (DULCOLAX) 5 mg tablet Take 1 Tab by mouth twice daily.   ? clonazePAM (KLONOPIN) 0.5 mg tablet Take one tablet by mouth every morning.   ? clonazePAM (KLONOPIN) 1 mg tablet Take one tablet by mouth daily.   ? CREON 24,000-76,000 -120,000 unit capsule TAKE 3 CAPSULES BY MOUTH THREE TIMES DAILY WITH MEALS   ? DOCOSAHEXANOIC ACID/EPA (FISH OIL PO) Take 2 Caps by mouth daily.   ? docusate (COLACE) 100 mg capsule Take 100 mg by mouth  twice daily.   ? glucosamine(+) 500 mg tab Take 500 mg by mouth daily.   ? lamoTRIgine (LAMICTAL) 150 mg tablet Take one tablet by mouth twice daily.   ? levothyroxine (SYNTHROID) 100 mcg tablet Take 100 mcg by mouth daily.   ? lithium carbonate (ESKALITH) 150 mg capsule Take 2 capsules by mouth every morning and 3 caps in the evening. Take with food.   ? lubiprostone (AMITIZA) 24 mcg cap Take one capsule by mouth twice daily with meals. Indications: irritable bowel syndrome with constipation   ? magnesium citrate oral solution Take 296 mL by mouth as Needed.   ? magnesium 400 mEq twice daily.   ? OLANZapine (ZYPREXA ZYDIS) 5 mg rapid dissolve tablet Dissolve one tablet by mouth daily as needed.   ? OLANZapine (ZYPREXA) 20 mg tablet Take one tablet by mouth at bedtime daily.   ? OLANZapine (ZYPREXA) 5 mg tablet Take one tablet by mouth every morning.   ? polyethylene glycol 3350 (GLYCOLAX; MIRALAX) 17 gram/dose powder Take 17 g by mouth twice daily.   ? propranoloL (INDERAL) 10 mg tablet Take one tablet by mouth twice daily.   ? QUEtiapine (SEROQUEL) 100 mg tablet Take one tab in the morning (with 300mg  for total dose 400mg ) and one tab at 2pm (with 300mg  for total dose 400mg )   ? QUEtiapine (SEROQUEL) 300 mg tablet Take one tab in the morning (with 100mg  tab for total dose 400mg ), one tab at 2pm (with 100mg  tab for total dose 400mg ), and two tabs at HS   ? senna (SENOKOT) 8.6 mg tablet Take 2 Tabs by mouth twice daily.   ? temazepam (RESTORIL) 15 mg capsule Take one capsule by mouth at bedtime daily.   ? temazepam (RESTORIL) 30 mg capsule Take one capsule by mouth at bedtime as needed.     Vitals:    10/29/19 0941   BP: (!) 157/97   BP Source: Arm, Left Upper   Patient Position: Sitting   Pulse: 73   Temp: 36.6 ?C (97.8 ?F)   TempSrc: Skin   Weight: 59 kg (130 lb)   Height: 167.6 cm (66)   PainSc: Five     Body mass index is 20.98 kg/m?Marland Kitchen     Physical Exam  Vitals signs reviewed.   Constitutional:       General: He is not in acute distress.     Appearance: He is well-developed. He is not diaphoretic.   HENT:      Head: Normocephalic and atraumatic.   Eyes:      General: No scleral icterus.        Right eye: No discharge.         Left eye: No discharge.      Conjunctiva/sclera: Conjunctivae normal.      Pupils: Pupils are equal, round, and reactive to light.   Neck:      Musculoskeletal: Normal range of motion and neck supple.      Thyroid: No thyromegaly.   Cardiovascular:      Rate and Rhythm: Normal rate and regular rhythm.      Heart sounds: Normal heart sounds.   Pulmonary:      Effort: Pulmonary effort is normal. No respiratory distress.      Breath sounds: Normal breath sounds. No wheezing.   Abdominal:      General: Bowel sounds are normal. There is no distension.      Palpations: Abdomen is soft. There is no mass.  Tenderness: There is abdominal tenderness. There is no guarding or rebound.      Comments: Soft, no distention, mild generalized tenderness without rebound or guarding.   Musculoskeletal: Normal range of motion.   Lymphadenopathy:      Cervical: No cervical adenopathy.   Skin:     General: Skin is warm and dry. Findings: No rash.   Neurological:      Mental Status: He is alert and oriented to person, place, and time.      Coordination: Coordination normal.   Psychiatric:         Behavior: Behavior normal.         Thought Content: Thought content normal.         Judgment: Judgment normal.              Assessment and Plan:      1. ?Unintentional weight loss with preserved appetite, currently stable.  He currently has a very good appetite.  He has previously undergone an extensive workup. No evidence for a malignant process. ?He is on thyroid replacement under the direction of his primary care physician. Pt will have lab including TSH drawn for outside provider today. He does drink beer protein supplement daily.    2. 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  He is likely not a good candidate for pelvic floor retraining as he would have difficulty understanding in order to participate.  Also, diversion also has not been felt to be in the best interest of Chad Randall as his mom does not feel he could psychologically handle a diverting ostomy.  Patient is on chronic laxatives.  Lost response to Linzess.  Currently, his mom feels he is doing better on Amitiza 24 mcg twice daily in addition to Colace 2 tablets twice daily, MiraLAX 2 scoops daily, and Dulcolax tablets 2 tablets twice daily.    3. ?Heartburn. No current symptoms.     4. ?SIBO, no current specific symptoms to suggest small intestinal bacterial overgrowth.     Plan:    1.  Since it seems to be working, continue Amitiza 24 mcg twice a day with food along with Dulcolax 2 tablets twice a day, Colace 2 tablets twice a day, and MiraLAX 2 scoops daily.  Can continue with mineral oil enema as needed and Dulcolax suppository as needed.  If he loses response to Amitiza, can consider trial of Trulance or Motegrity to see if helpful.  2.  Continue Creon 24,000 units of lipase, 3 with meals, less with snacks.  3.  Continue diet as tolerated with increased caloric intake.  Monitor weight.  Continue protein supplements.  4.  Lab work today as ordered by outside physician.  5.  For now, Chad Randall would like for patient to follow-up in the GI clinic as needed.  She will certainly call at any time if she has any concerns about any of his symptoms or the plan of care.    Patient and his mother were advised of the plan and voiced agreement and understanding.     Total time: 40 minutes spent on this patient's encounter with counseling and coordination of care taking >50% of the visit.      This note was in part completed with Dragon, a voice to text dictation system. Some errors may have occured and persist despite my best efforts to edit this document to eliminate dictation/translation?related errors.? If you have questions/concerns, please contact me for clarification.

## 2019-10-31 IMAGING — CR ABDOMEN
3 series · 3 of 3 positions shown · non-contrast
Comparison: none

EXAM

Three-view abdomen.
INDICATION
Constipation.
FINDINGS
The heart normal size. There is no acute lung process.
Scattered bowel gas is present, this is both large and small bowel. Small bowel gas is more
prominent.
Stool seen within the colon, significant constipation is not appreciated.
No free air is seen. The psoas margins are well identified.

[chest pa x-wise]
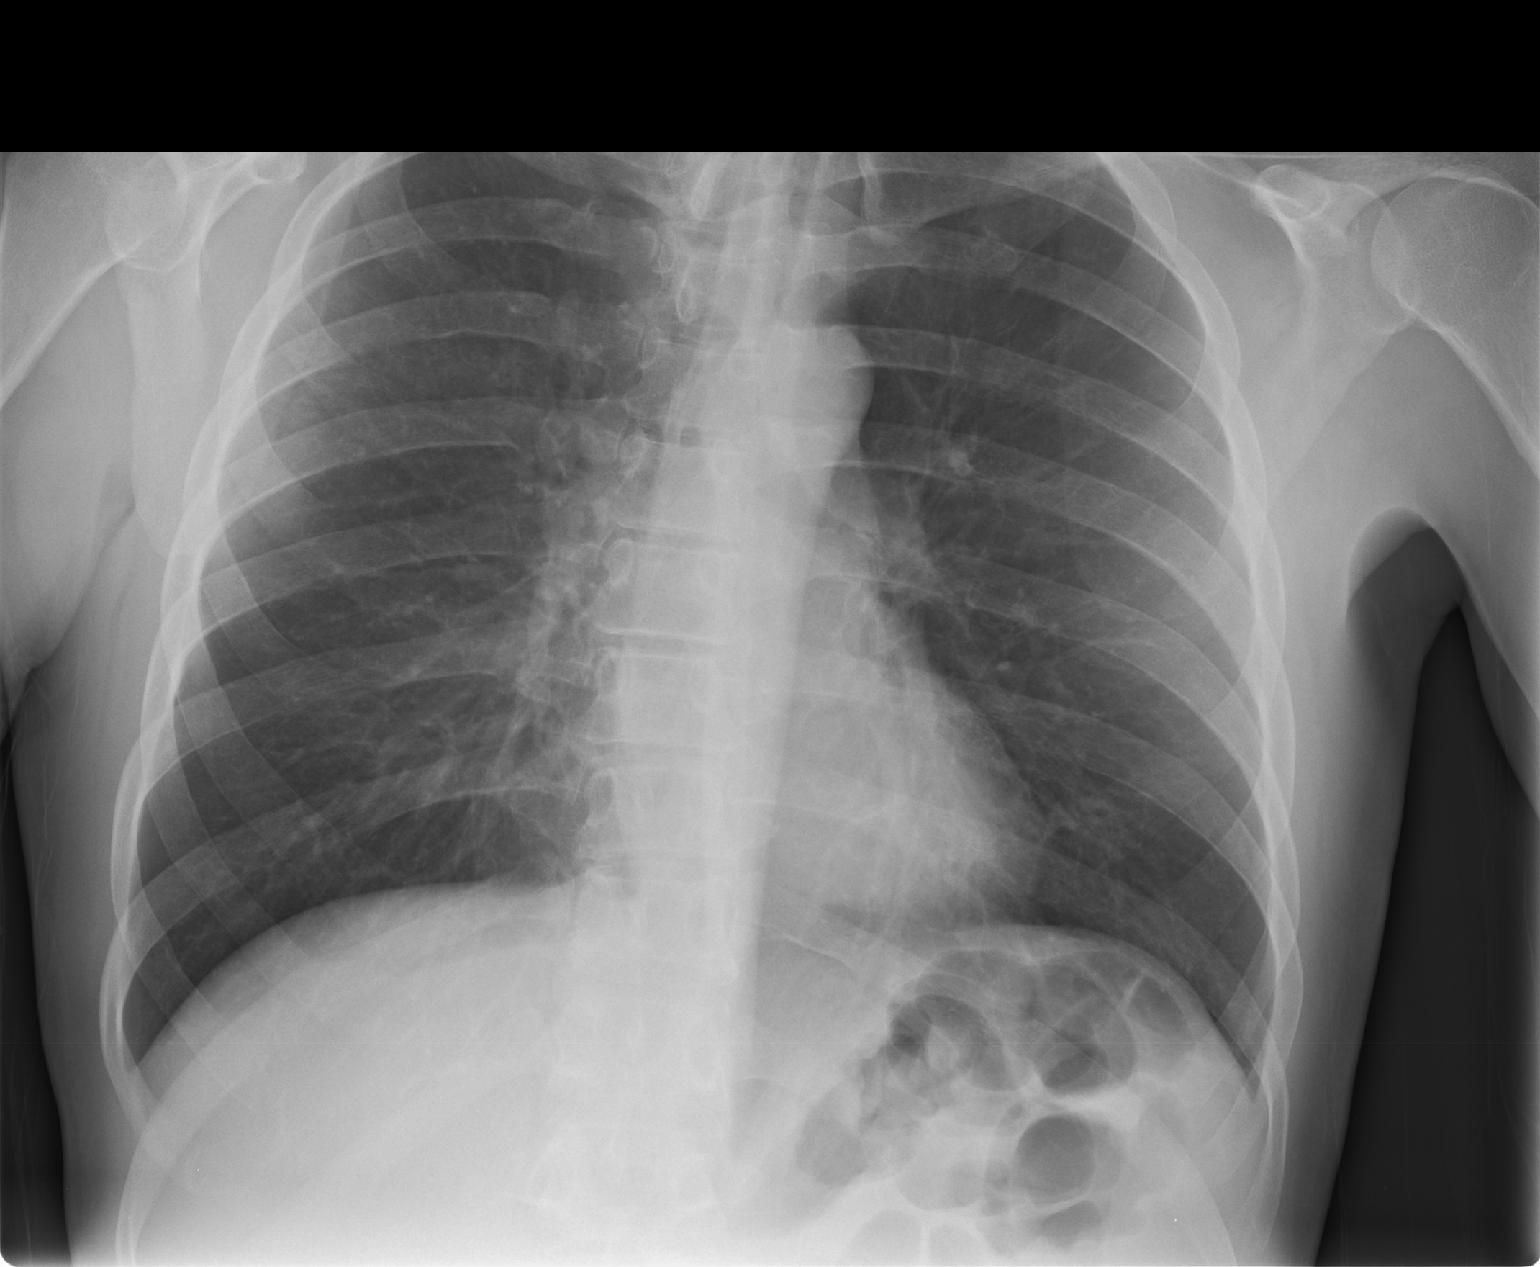

[abdomen upright]
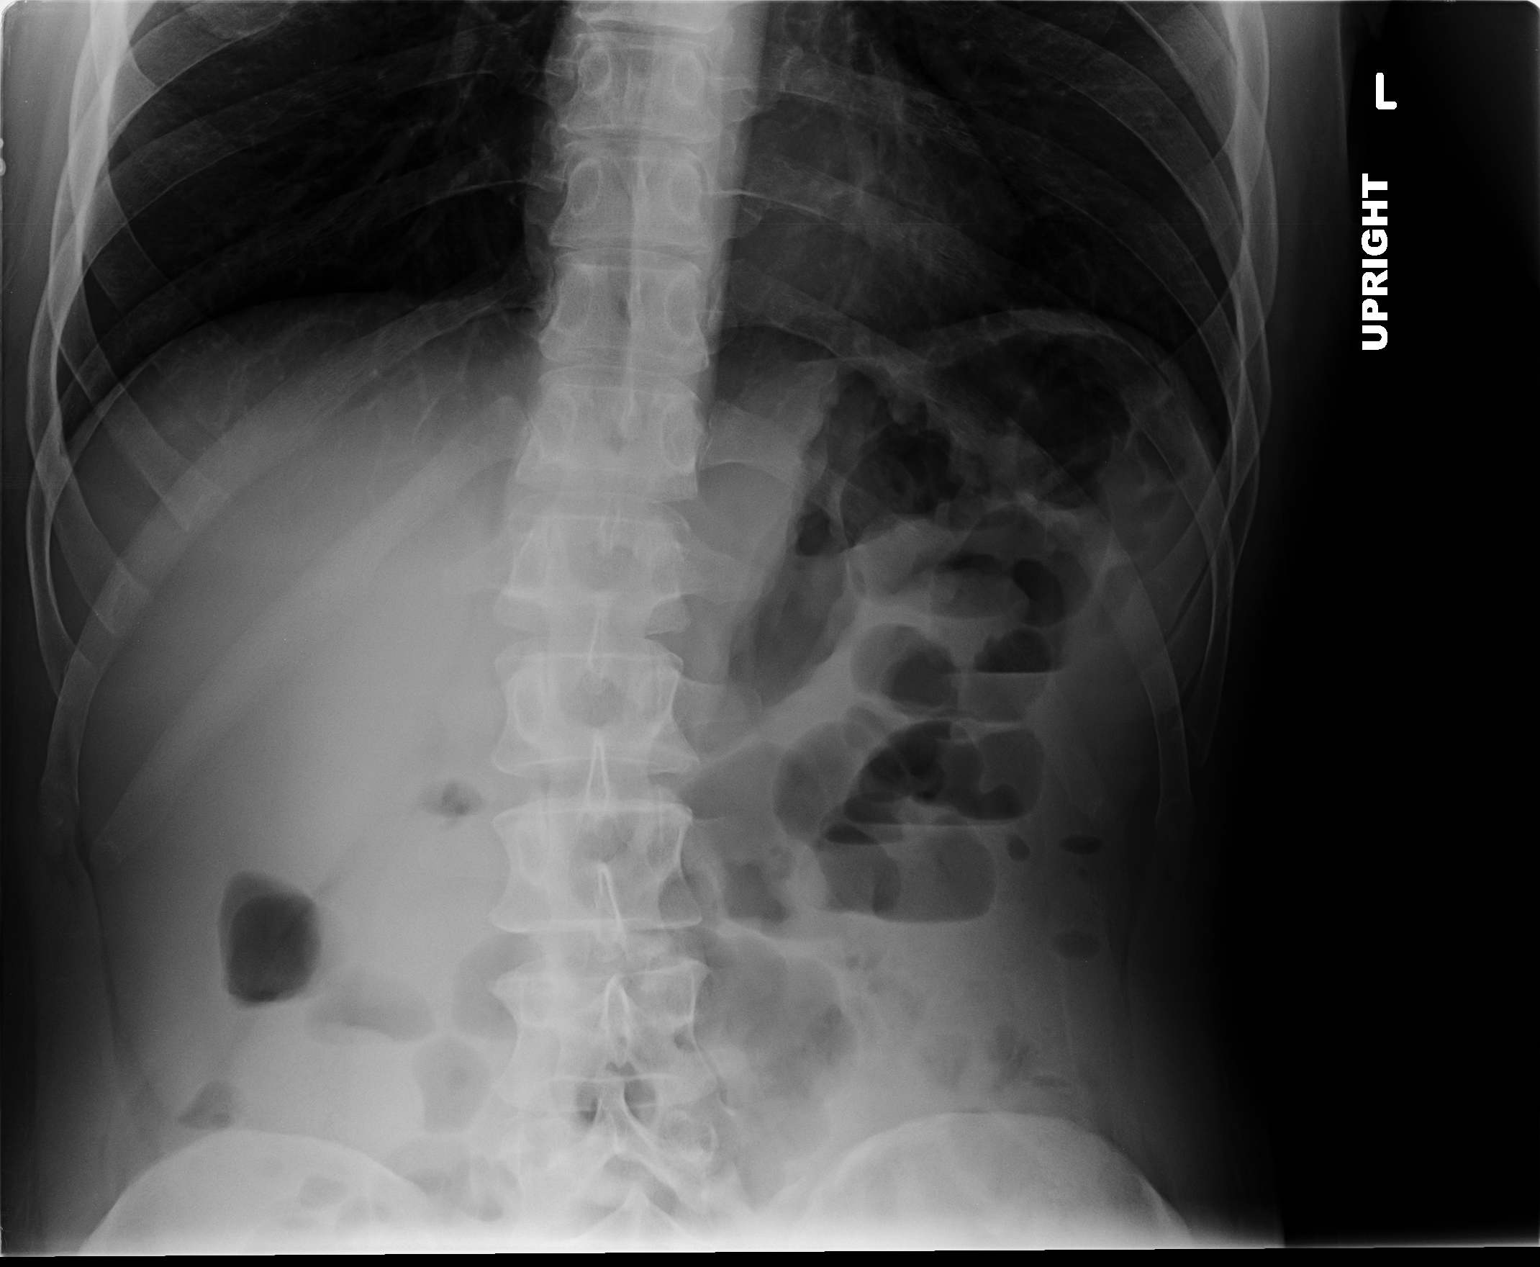

[abdomen supine kub]
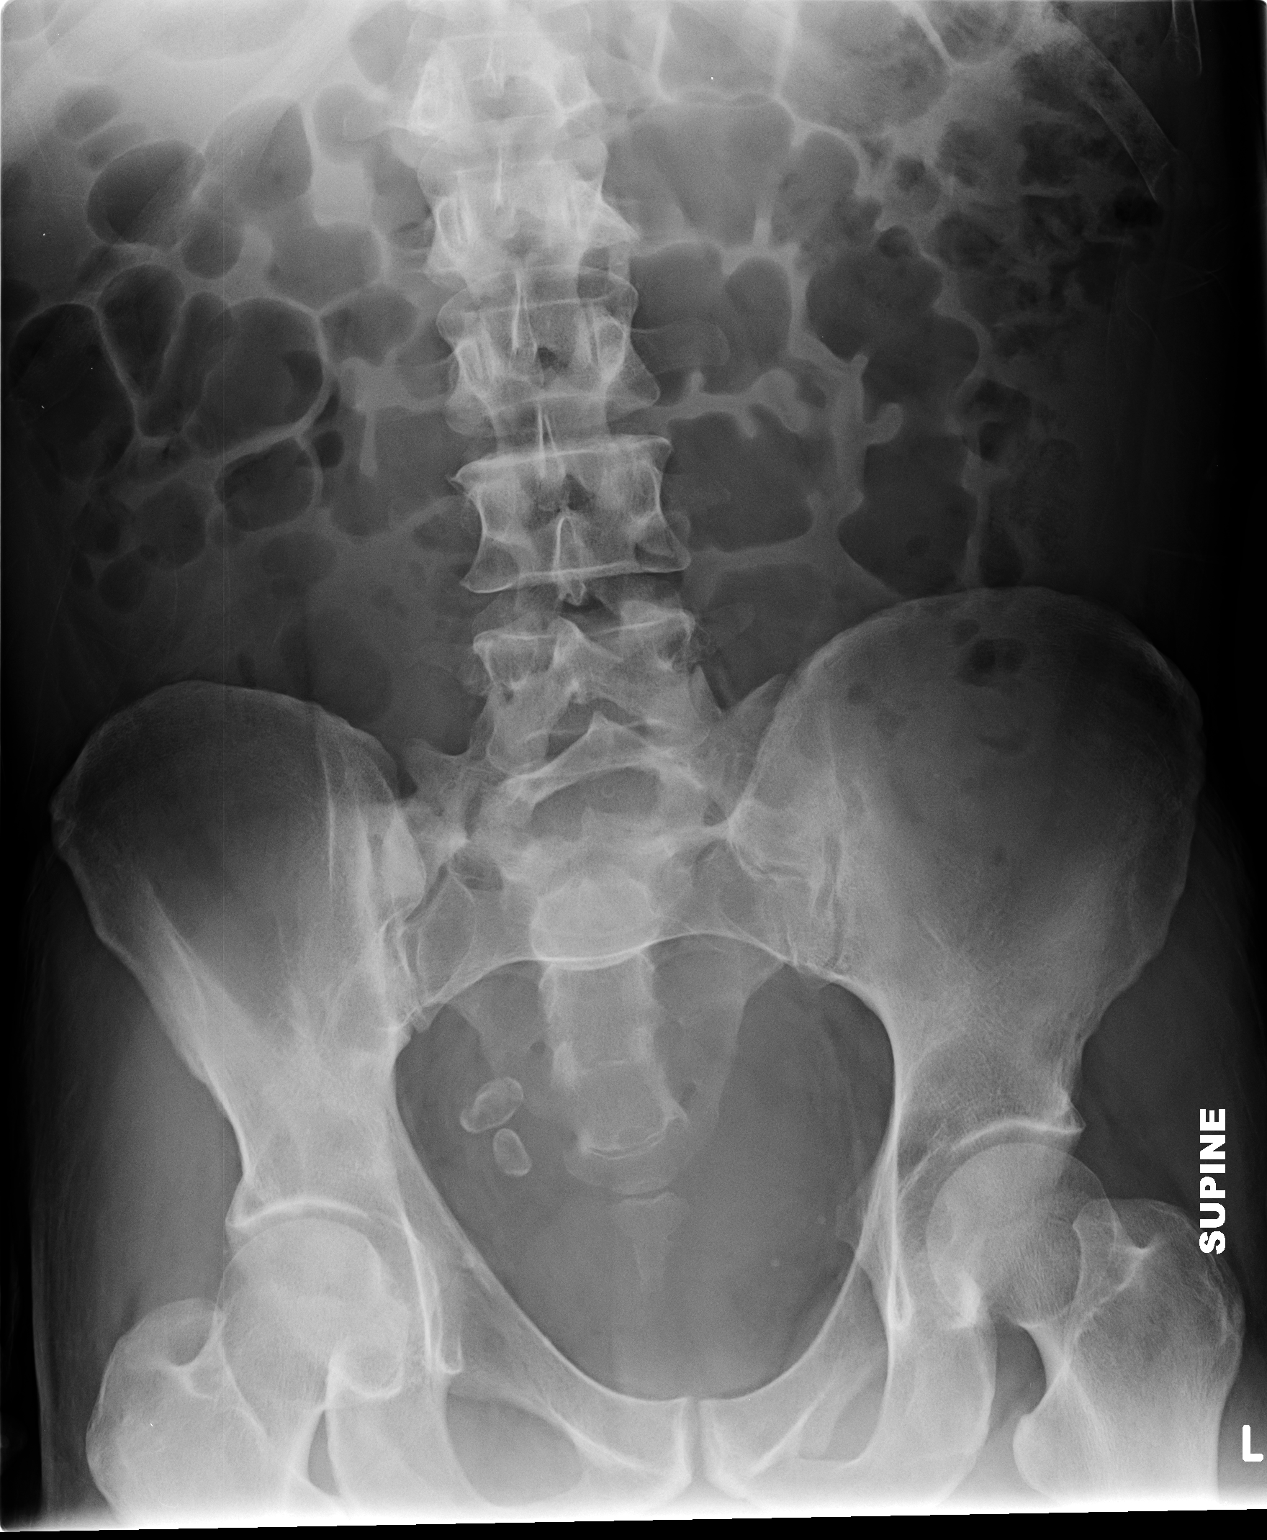

[3 of 3 positions shown; findings below may reference images not displayed]

IMPRESSION: Mild gas distention of bowel. No obstruction or free air.

## 2019-11-16 NOTE — Progress Notes
Labs received for this patient.  Labs were drawn on 11/05/2019.  Chemistry profile notable for creatinine 1.42, GFR 55, no electrolyte disturbances, hemoglobin A1c 4.7%, AST and ALT within normal limits.  Lipid panel within normal limits.  TSH 1.86.  Lithium level 1.0.  CBC notable for MCV 103 with a normal hemoglobin, white count, and platelet count.        Labs will be placed in medical record box to be scanned into chart.

## 2019-12-03 ENCOUNTER — Encounter: Admit: 2019-12-03 | Discharge: 2019-12-03 | Payer: MEDICARE

## 2019-12-03 NOTE — Telephone Encounter
Mother confirms appointment with Urology.  Aware of appointment time/date/location with Merwyn Katos, NP

## 2019-12-07 ENCOUNTER — Encounter: Admit: 2019-12-07 | Discharge: 2019-12-07 | Payer: MEDICARE

## 2019-12-07 ENCOUNTER — Ambulatory Visit: Admit: 2019-12-07 | Discharge: 2019-12-08 | Payer: MEDICARE

## 2019-12-07 DIAGNOSIS — R12 Heartburn: Secondary | ICD-10-CM

## 2019-12-07 DIAGNOSIS — R21 Rash and other nonspecific skin eruption: Secondary | ICD-10-CM

## 2019-12-07 DIAGNOSIS — E039 Hypothyroidism, unspecified: Secondary | ICD-10-CM

## 2019-12-07 DIAGNOSIS — F840 Autistic disorder: Secondary | ICD-10-CM

## 2019-12-07 DIAGNOSIS — D72819 Decreased white blood cell count, unspecified: Secondary | ICD-10-CM

## 2019-12-07 DIAGNOSIS — Z9181 History of falling: Secondary | ICD-10-CM

## 2019-12-07 DIAGNOSIS — F79 Unspecified intellectual disabilities: Secondary | ICD-10-CM

## 2019-12-07 DIAGNOSIS — K56609 Unspecified intestinal obstruction, unspecified as to partial versus complete obstruction: Secondary | ICD-10-CM

## 2019-12-07 DIAGNOSIS — K5900 Constipation, unspecified: Secondary | ICD-10-CM

## 2019-12-07 DIAGNOSIS — R51 Headache: Secondary | ICD-10-CM

## 2019-12-07 DIAGNOSIS — A048 Other specified bacterial intestinal infections: Secondary | ICD-10-CM

## 2019-12-07 DIAGNOSIS — Z79899 Other long term (current) drug therapy: Secondary | ICD-10-CM

## 2019-12-07 DIAGNOSIS — R399 Unspecified symptoms and signs involving the genitourinary system: Secondary | ICD-10-CM

## 2019-12-07 DIAGNOSIS — G249 Dystonia, unspecified: Secondary | ICD-10-CM

## 2019-12-07 DIAGNOSIS — R339 Retention of urine, unspecified: Secondary | ICD-10-CM

## 2019-12-07 DIAGNOSIS — R55 Syncope and collapse: Secondary | ICD-10-CM

## 2019-12-07 MED ORDER — TAMSULOSIN 0.4 MG PO CAP
0.4 mg | ORAL_CAPSULE | Freq: Every day | ORAL | 11 refills | 90.00000 days | Status: DC
Start: 2019-12-07 — End: 2020-01-06

## 2019-12-07 NOTE — Assessment & Plan Note
Discussed possible etiology for obstructive LUTS & elevated post-void residual volume.  Discussed watchful waiting and continuing to monitor symptoms.  Discussed medications classes including Alpha1-Blockers and 5-Alpha Reductase Inhibitors to help reduce symptoms. Provided information about the mechanism of action, risks, benefits and adverse effects of both classes.     Discussed the risks and benefits of additional testing including cystoscopy, transrectal ultrasound of the prostate and urodynamics. Provided information about each procedure and how they could provide added benefit to future treatment plans if neccessary.  Mother discussed limitations regarding additional evaluation secondary to patient's traumatic experience with previous catheterization.    After significant consideration, the patient would like to proceed with tamsulosin 0.4 mg once daily.  May consider additional management options including finasteride, daily Cialis, pelvic floor physical therapy.

## 2019-12-08 DIAGNOSIS — R39198 Other difficulties with micturition: Secondary | ICD-10-CM

## 2019-12-08 DIAGNOSIS — R300 Dysuria: Secondary | ICD-10-CM

## 2019-12-29 ENCOUNTER — Encounter: Admit: 2019-12-29 | Discharge: 2019-12-29 | Payer: MEDICARE

## 2020-01-04 NOTE — Progress Notes
Date of Service: 01/06/2020     Subjective:             Chad Randall. is a 54 y.o. male    History of Present Illness    Chad Randall.?is a 54 y.o.?Male,?with past medical history including schizophrenia, bipolar disorder, autism, ADHD, constipation, small bowel obstruction. He has previously seen Dr. Oswaldo Milian in 2018 for evaluation of LUTS. He returns to clinic for evaluation of difficult with urination.  He presents with his mother, who provides all the history during the appointment.    She reports that he has been taking Flomax following his last appointment.  He reports that his urinary symptoms are obviously better.  She reports that he has had a decrease in urinary frequency.  She reports that she feels though he spends about 30 to 35% of the time in the bathroom currently.  She does endorse that he may go to the bathroom as much as 30 times a day but will have episodes in which she would go back, the back, to back, to back before he will feel as though he is empty his bladder completely.  She reports that he has had less difficulties with urination including less pushing/straining.  She also endorses that he has had improvement with constipation.  She reports less nocturnal enuresis and overall improved urinary leakage, 1-2 PPD.  ?  PVR: 273 mL    Medical History:   Diagnosis Date   ? Adverse effect     neuroleptic drugs   ? Autism    ? Constipation 10/28/2010   ? Dystonia    ? H. pylori infection    ? Headache(784.0) 08/26/2006   ? Heartburn symptom    ? History of fall     usually associated with urination or getting to feet   ? Hypothyroidism    ? Long term use of drug     antipsychotic drugs   ? Mental retardation    ? Rash     raised red rash, itches on belly, trunk, and underarms   ? SBO (small bowel obstruction) (HCC)    ? Syncope and collapse 08/26/2006   ? WBC decreased        Surgical History:   Procedure Laterality Date   ? ESOPHAGOGASTRODUODENOSCOPY ENDOSCOPIC ULTRASOUND & colonoscopy at [facility] only regarding weight loss N/A 06/17/2017    Performed by Bernita Buffy, MD at [facility] END[facility]   ? COLONOSCOPY & EGD/EUS N/A 06/17/2017    Performed by Bernita Buffy, MD at [facility] END[facility]   ? ESOPHAGOGASTRODUODENOSCOPY BIOPSY  06/17/2017    Performed by Bernita Buffy, MD at [facility] END[facility]   ? COLONOSCOPY BIOPSY  06/17/2017    Performed by Bernita Buffy, MD at [facility] ENDO   ? BREATH HYDROGEN/ METHANE TESTING w/ glucose N/A 12/24/2017    Performed by Tim Lair, MD at [facility] ENDO   ? HX BUNIONECTOMY     ? HX TONSIL AND ADENOIDECTOMY     ? HX VASECTOMY         Family History   Problem Relation Age of Onset   ? Other Father    ? Bipolar Disorder Father    ? Hypertension Father    ? Heart Attack Father    ? High Cholesterol Father    ? Stroke Father    ? Depression Father    ? Other Paternal Grandfather    ? Heart Attack Paternal Grandfather    ? Hypertension Paternal Grandfather    ?  Stroke Paternal Grandfather    ? Depression Paternal Grandfather    ? Colon Polyps Mother    ? High Cholesterol Mother    ? Migraines Mother    ? Heart Attack Maternal Grandfather    ? Stroke Maternal Grandfather    ? Heart Attack Paternal Grandmother    ? Cancer-Colon Neg Hx        Current Outpatient Medications   Medication Sig Dispense Refill   ? aspirin EC 81 mg tablet Take 81 mg by mouth daily. Take with food.     ? atorvastatin (LIPITOR) 20 mg tablet Take 20 mg by mouth at bedtime daily.     ? bisacodyl (DULCOLAX) 5 mg tablet Take 1 Tab by mouth twice daily. 30 Tab 0   ? clonazePAM (KLONOPIN) 0.5 mg tablet Take one tablet by mouth every morning. 30 tablet 5   ? clonazePAM (KLONOPIN) 1 mg tablet Take one tablet by mouth daily. 30 tablet 5   ? CREON 24,000-76,000 -120,000 unit capsule TAKE 3 CAPSULES BY MOUTH THREE TIMES DAILY WITH MEALS 270 capsule 5   ? DOCOSAHEXANOIC ACID/EPA (FISH OIL PO) Take 2 Caps by mouth daily.     ? docusate (COLACE) 100 mg capsule Take 100 mg by mouth twice daily.     ? glucosamine(+) 500 mg tab Take 500 mg by mouth daily.     ? lamoTRIgine (LAMICTAL) 150 mg tablet Take one tablet by mouth twice daily. 180 tablet 1   ? levothyroxine (SYNTHROID) 100 mcg tablet Take 100 mcg by mouth daily.     ? lithium carbonate (ESKALITH) 150 mg capsule Take 2 capsules by mouth every morning and 3 caps in the evening. Take with food. 450 capsule 1   ? lubiprostone (AMITIZA) 24 mcg cap Take one capsule by mouth twice daily with meals. Indications: irritable bowel syndrome with constipation 60 capsule 5   ? magnesium citrate oral solution Take 296 mL by mouth as Needed.     ? magnesium 400 mEq twice daily.     ? OLANZapine (ZYPREXA ZYDIS) 5 mg rapid dissolve tablet Dissolve one tablet by mouth daily as needed. 30 tablet 3   ? OLANZapine (ZYPREXA) 20 mg tablet Take one tablet by mouth at bedtime daily. 90 tablet 1   ? OLANZapine (ZYPREXA) 5 mg tablet Take one tablet by mouth every morning. 90 tablet 1   ? polyethylene glycol 3350 (GLYCOLAX; MIRALAX) 17 gram/dose powder Take 17 g by mouth twice daily.     ? propranoloL (INDERAL) 10 mg tablet Take one tablet by mouth twice daily. 60 tablet 5   ? QUEtiapine (SEROQUEL) 100 mg tablet Take one tab in the morning (with 300mg  for total dose 400mg ) and one tab at 2pm (with 300mg  for total dose 400mg ) 180 tablet 4   ? QUEtiapine (SEROQUEL) 300 mg tablet Take one tab in the morning (with 100mg  tab for total dose 400mg ), one tab at 2pm (with 100mg  tab for total dose 400mg ), and two tabs at HS 360 tablet 4   ? senna (SENOKOT) 8.6 mg tablet Take 2 Tabs by mouth twice daily.     ? tamsulosin (FLOMAX) 0.4 mg capsule Take one capsule by mouth twice daily. Take 30 min after same meal.  Do not cut/ crush/ chew. 60 capsule 11   ? temazepam (RESTORIL) 15 mg capsule Take one capsule by mouth at bedtime daily. 30 capsule 5   ? temazepam (RESTORIL) 30 mg capsule Take one capsule by mouth at  bedtime as needed. 30 capsule 5     No current facility-administered medications for this visit.       Allergies Allergen Reactions   ? Diphenhydramine-Zinc Acetate AGITATION     Patient becomes agiitated and combative   ? Haloperidol Lactate AGITATION     Severe agitation per mother   ? Albuterol SEE COMMENTS     Extreme hyperactivity-per mom   ? Amitriptyline SEE COMMENTS     Uncontrollable muscle movements per mother   ? Antihistamine-1 AGITATION     Patient becomes agiitated and combative   ? Atomoxetine UNKNOWN   ? Bee Sting [Venom-Honey Bee] UNKNOWN   ? Chlorpheniram-Dm-Acetaminophen AGITATION     Patient becomes agiitated and combative   ? Clemastine UNKNOWN   ? Clozaril [Clozapine] AGITATION     Extreme anger/agitation   ? Depakote [Divalproex] SEE COMMENTS     Per mother worked well to control behavior, but caused liver tests to become abnormal so was stopped.     ? Erythromycin RASH     Allergy recorded in SMS: E-MYCIN~Reactions: RASH   ? Flurazepam UNKNOWN   ? Melatonin AGITATION   ? Phenylpropanolamine UNKNOWN   ? Pseudoephedrine UNKNOWN   ? Tripelennamine UNKNOWN   ? Triprolidine UNKNOWN   ? Zolpidem AGITATION       Social History     Socioeconomic History   ? Marital status: Single     Spouse name: Not on file   ? Number of children: Not on file   ? Years of education: Not on file   ? Highest education level: Not on file   Occupational History     Employer: [employer]   Tobacco Use   ? Smoking status: Never Smoker   ? Smokeless tobacco: Never Used   Substance and Sexual Activity   ? Alcohol use: No   ? Drug use: No   ? Sexual activity: Not on file   Other Topics Concern   ? Not on file   Social History Narrative   ? Not on file         Review of Systems   Constitutional: Negative for activity change, appetite change, chills, diaphoresis, fatigue, fever and unexpected weight change.   HENT: Negative for congestion, hearing loss, mouth sores and sinus pressure.    Eyes: Negative for visual disturbance.   Respiratory: Negative for apnea, cough, chest tightness and shortness of breath. Cardiovascular: Negative for chest pain, palpitations and leg swelling.   Gastrointestinal: Negative for abdominal pain, blood in stool, constipation, diarrhea, nausea, rectal pain and vomiting.   Genitourinary: Negative for decreased urine volume, difficulty urinating, discharge, dysuria, enuresis, flank pain, frequency, genital sores, hematuria, penile pain, penile swelling, scrotal swelling, testicular pain and urgency.   Musculoskeletal: Negative for arthralgias, back pain, gait problem and myalgias.   Skin: Negative for rash and wound.   Neurological: Negative for dizziness, tremors, seizures, syncope, weakness, light-headedness, numbness and headaches.   Hematological: Negative for adenopathy. Does not bruise/bleed easily.   Psychiatric/Behavioral: Negative for decreased concentration and dysphoric mood. The patient is not nervous/anxious.        Objective:         ? aspirin EC 81 mg tablet Take 81 mg by mouth daily. Take with food.   ? atorvastatin (LIPITOR) 20 mg tablet Take 20 mg by mouth at bedtime daily.   ? bisacodyl (DULCOLAX) 5 mg tablet Take 1 Tab by mouth twice daily.   ? clonazePAM (KLONOPIN) 0.5 mg  tablet Take one tablet by mouth every morning.   ? clonazePAM (KLONOPIN) 1 mg tablet Take one tablet by mouth daily.   ? CREON 24,000-76,000 -120,000 unit capsule TAKE 3 CAPSULES BY MOUTH THREE TIMES DAILY WITH MEALS   ? DOCOSAHEXANOIC ACID/EPA (FISH OIL PO) Take 2 Caps by mouth daily.   ? docusate (COLACE) 100 mg capsule Take 100 mg by mouth twice daily.   ? glucosamine(+) 500 mg tab Take 500 mg by mouth daily.   ? lamoTRIgine (LAMICTAL) 150 mg tablet Take one tablet by mouth twice daily.   ? levothyroxine (SYNTHROID) 100 mcg tablet Take 100 mcg by mouth daily.   ? lithium carbonate (ESKALITH) 150 mg capsule Take 2 capsules by mouth every morning and 3 caps in the evening. Take with food.   ? lubiprostone (AMITIZA) 24 mcg cap Take one capsule by mouth twice daily with meals. Indications: irritable bowel syndrome with constipation   ? magnesium citrate oral solution Take 296 mL by mouth as Needed.   ? magnesium 400 mEq twice daily.   ? OLANZapine (ZYPREXA ZYDIS) 5 mg rapid dissolve tablet Dissolve one tablet by mouth daily as needed.   ? OLANZapine (ZYPREXA) 20 mg tablet Take one tablet by mouth at bedtime daily.   ? OLANZapine (ZYPREXA) 5 mg tablet Take one tablet by mouth every morning.   ? polyethylene glycol 3350 (GLYCOLAX; MIRALAX) 17 gram/dose powder Take 17 g by mouth twice daily.   ? propranoloL (INDERAL) 10 mg tablet Take one tablet by mouth twice daily.   ? QUEtiapine (SEROQUEL) 100 mg tablet Take one tab in the morning (with 300mg  for total dose 400mg ) and one tab at 2pm (with 300mg  for total dose 400mg )   ? QUEtiapine (SEROQUEL) 300 mg tablet Take one tab in the morning (with 100mg  tab for total dose 400mg ), one tab at 2pm (with 100mg  tab for total dose 400mg ), and two tabs at HS   ? senna (SENOKOT) 8.6 mg tablet Take 2 Tabs by mouth twice daily.   ? tamsulosin (FLOMAX) 0.4 mg capsule Take one capsule by mouth twice daily. Take 30 min after same meal.  Do not cut/ crush/ chew.   ? temazepam (RESTORIL) 15 mg capsule Take one capsule by mouth at bedtime daily.   ? temazepam (RESTORIL) 30 mg capsule Take one capsule by mouth at bedtime as needed.     Vitals:    01/06/20 0824   BP: (!) 123/96   BP Source: Arm, Right Upper   Patient Position: Sitting   Pulse: 69   Weight: 59 kg (130 lb)   Height: 167.6 cm (66)   PainSc: Six     Body mass index is 20.98 kg/m?Marland Kitchen       Physical Exam   Constitutional: He is oriented to person, place, and time. He appears well-developed and well-nourished. No distress.   HENT:   Head: Normocephalic and atraumatic.   Eyes: Conjunctivae are normal. Right eye exhibits no discharge. Left eye exhibits no discharge.   Cardiovascular: Normal rate.   Pulmonary/Chest: Effort normal. No respiratory distress.   Musculoskeletal:         General: No deformity or edema.      Cervical back: Normal range of motion.   Neurological: He is alert and oriented to person, place, and time.   Skin: Skin is warm and dry. He is not diaphoretic.   Psychiatric: He has a normal mood and affect. His behavior is normal. Judgment and thought content normal.  Assessment and Plan:    Problem   Lower Urinary Tract Symptoms (Luts)    Consult 05/27/17  #1 complaint - increased LUTS x 1 year  Chronic dysuria, Frequency every 30 minutes, urge and urge incontinence, occasionally wearing Depends 3-4 day, nocturia x 2  No trial of OAB medications    05/27/17   PVR 180 MLs, PVR #2 3cc           Lower urinary tract symptoms (LUTS)  54 year old male returns to clinic with his mother for ongoing evaluation of incomplete bladder emptying, urinary frequency.  Patient has been taking Flomax 0.4 mg once daily following his last appointment and his mother notes improvement with urination including decrease in urinary frequency, leakage, pushing/straining to urinate.  His postvoid residual today was slightly elevated compared to previous result at 273 mL.    Discussed with patient and his mother additional options for ongoing incomplete emptying/frequency including increasing his Flomax from once to twice daily to see if there is improvement with bladder emptying/frequency, starting Cialis 5 mg daily, starting finasteride, referral to urologist for additional consultation and consideration for diagnostic evaluation.  Patient's mother reports preference regarding less invasive measures based upon patient's past medical history.    Plan to proceed with Flomax 0.4 mg twice daily, reviewed with patient and his mother potential worsening of dizziness/lightheadedness instructed them to discontinue and call clinic if he developed the symptoms.  She reports she is currently not experiencing any side effects from Flomax once daily at this time.  Plan return to clinic in 6 weeks for additional evaluation with postvoid residual.  All questions and concerns answered at this time.        Orders Placed This Encounter   ? tamsulosin (FLOMAX) 0.4 mg capsule     Return in about 6 weeks (around 02/17/2020) for Freq/incomplete emptying.      Abby Potash, APRN-NP  Department of Urology

## 2020-01-06 ENCOUNTER — Encounter: Admit: 2020-01-06 | Discharge: 2020-01-06 | Payer: MEDICARE

## 2020-01-06 ENCOUNTER — Ambulatory Visit: Admit: 2020-01-06 | Discharge: 2020-01-07 | Payer: MEDICARE

## 2020-01-06 DIAGNOSIS — F840 Autistic disorder: Secondary | ICD-10-CM

## 2020-01-06 DIAGNOSIS — R51 Headache: Secondary | ICD-10-CM

## 2020-01-06 DIAGNOSIS — R21 Rash and other nonspecific skin eruption: Secondary | ICD-10-CM

## 2020-01-06 DIAGNOSIS — G249 Dystonia, unspecified: Secondary | ICD-10-CM

## 2020-01-06 DIAGNOSIS — F79 Unspecified intellectual disabilities: Secondary | ICD-10-CM

## 2020-01-06 DIAGNOSIS — Z79899 Other long term (current) drug therapy: Secondary | ICD-10-CM

## 2020-01-06 DIAGNOSIS — K56609 Unspecified intestinal obstruction, unspecified as to partial versus complete obstruction: Secondary | ICD-10-CM

## 2020-01-06 DIAGNOSIS — A048 Other specified bacterial intestinal infections: Secondary | ICD-10-CM

## 2020-01-06 DIAGNOSIS — E039 Hypothyroidism, unspecified: Secondary | ICD-10-CM

## 2020-01-06 DIAGNOSIS — D72819 Decreased white blood cell count, unspecified: Secondary | ICD-10-CM

## 2020-01-06 DIAGNOSIS — Z9181 History of falling: Secondary | ICD-10-CM

## 2020-01-06 DIAGNOSIS — R399 Unspecified symptoms and signs involving the genitourinary system: Secondary | ICD-10-CM

## 2020-01-06 DIAGNOSIS — R55 Syncope and collapse: Secondary | ICD-10-CM

## 2020-01-06 DIAGNOSIS — K5900 Constipation, unspecified: Secondary | ICD-10-CM

## 2020-01-06 DIAGNOSIS — R12 Heartburn: Secondary | ICD-10-CM

## 2020-01-06 MED ORDER — TAMSULOSIN 0.4 MG PO CAP
0.4 mg | ORAL_CAPSULE | Freq: Two times a day (BID) | ORAL | 11 refills | 90.00000 days | Status: AC
Start: 2020-01-06 — End: ?

## 2020-01-06 NOTE — Assessment & Plan Note
54 year old male returns to clinic with his mother for ongoing evaluation of incomplete bladder emptying, urinary frequency.  Patient has been taking Flomax 0.4 mg once daily following his last appointment and his mother notes improvement with urination including decrease in urinary frequency, leakage, pushing/straining to urinate.  His postvoid residual today was slightly elevated compared to previous result at 273 mL.    Discussed with patient and his mother additional options for ongoing incomplete emptying/frequency including increasing his Flomax from once to twice daily to see if there is improvement with bladder emptying/frequency, starting Cialis 5 mg daily, starting finasteride, referral to urologist for additional consultation and consideration for diagnostic evaluation.  Patient's mother reports preference regarding less invasive measures based upon patient's past medical history.    Plan to proceed with Flomax 0.4 mg twice daily, reviewed with patient and his mother potential worsening of dizziness/lightheadedness instructed them to discontinue and call clinic if he developed the symptoms.  She reports she is currently not experiencing any side effects from Flomax once daily at this time.  Plan return to clinic in 6 weeks for additional evaluation with postvoid residual.  All questions and concerns answered at this time.

## 2020-01-06 NOTE — Progress Notes
PVR 273ml

## 2020-01-07 DIAGNOSIS — R339 Retention of urine, unspecified: Secondary | ICD-10-CM

## 2020-01-14 ENCOUNTER — Encounter: Admit: 2020-01-14 | Discharge: 2020-01-14 | Payer: MEDICARE

## 2020-01-14 DIAGNOSIS — K5904 Chronic idiopathic constipation: Secondary | ICD-10-CM

## 2020-01-14 MED ORDER — LUBIPROSTONE 24 MCG PO CAP
ORAL_CAPSULE | Freq: Two times a day (BID) | ORAL | 5 refills | 90.00000 days | Status: AC
Start: 2020-01-14 — End: ?

## 2020-02-11 ENCOUNTER — Encounter: Admit: 2020-02-11 | Discharge: 2020-02-11 | Payer: MEDICARE

## 2020-02-11 DIAGNOSIS — F250 Schizoaffective disorder, bipolar type: Secondary | ICD-10-CM

## 2020-02-12 MED ORDER — LAMOTRIGINE 150 MG PO TAB
150 mg | ORAL_TABLET | Freq: Two times a day (BID) | ORAL | 1 refills | Status: AC
Start: 2020-02-12 — End: ?

## 2020-02-12 MED ORDER — CLONAZEPAM 1 MG PO TAB
ORAL_TABLET | Freq: Every day | 5 refills | Status: AC
Start: 2020-02-12 — End: ?

## 2020-02-12 MED ORDER — TEMAZEPAM 30 MG PO CAP
30 mg | ORAL_CAPSULE | Freq: Every evening | ORAL | 5 refills | Status: AC | PRN
Start: 2020-02-12 — End: ?

## 2020-02-12 MED ORDER — OLANZAPINE 5 MG PO TAB
5 mg | ORAL_TABLET | Freq: Every morning | ORAL | 1 refills | 30.00000 days | Status: AC
Start: 2020-02-12 — End: ?

## 2020-02-12 MED ORDER — OLANZAPINE 20 MG PO TAB
ORAL_TABLET | Freq: Every evening | ORAL | 1 refills | 30.00000 days | Status: AC
Start: 2020-02-12 — End: ?

## 2020-02-24 ENCOUNTER — Encounter: Admit: 2020-02-24 | Discharge: 2020-02-24 | Payer: MEDICARE

## 2020-03-01 ENCOUNTER — Encounter: Admit: 2020-03-01 | Discharge: 2020-03-01 | Payer: MEDICARE

## 2020-03-21 ENCOUNTER — Encounter: Admit: 2020-03-21 | Discharge: 2020-03-21 | Payer: MEDICARE

## 2020-03-21 DIAGNOSIS — F250 Schizoaffective disorder, bipolar type: Secondary | ICD-10-CM

## 2020-03-21 MED ORDER — PROPRANOLOL 10 MG PO TAB
10 mg | ORAL_TABLET | Freq: Two times a day (BID) | ORAL | 5 refills
Start: 2020-03-21 — End: ?

## 2020-03-21 MED ORDER — CLONAZEPAM 0.5 MG PO TAB
.5 mg | ORAL_TABLET | Freq: Every morning | ORAL | 0 refills
Start: 2020-03-21 — End: ?

## 2020-03-21 MED ORDER — TEMAZEPAM 15 MG PO CAP
ORAL_CAPSULE | Freq: Every evening | 5 refills
Start: 2020-03-21 — End: ?

## 2020-03-21 MED ORDER — CREON 24,000-76,000 -120,000 UNIT PO CPDR
ORAL_CAPSULE | Freq: Three times a day (TID) | ORAL | 5 refills | 30.00000 days | Status: AC
Start: 2020-03-21 — End: ?

## 2020-04-21 NOTE — Progress Notes
Date of Service: 04/25/2020     Subjective:             Chad Randall. is a 54 y.o. male    History of Present Illness    Chad Randall.?is a 53?y.o.?Male,?with past medical history including schizophrenia, bipolar disorder, autism, ADHD, constipation, small bowel obstruction. He has previously seen Dr. Oswaldo Milian in 2018 for evaluation of LUTS.?He returns to clinic for evaluation of difficult with urination.??He presents with his mother, who provides all the history during the appointment.    His mother reports that following his last appointment he has continued to have significant difficulties with urination.  She reports that he has been taking Flomax twice a day without any significant improvement in his underlying urinary symptoms.  He reports he continues to go to bathroom at significant frequency.  She continues endorse that he will have the urge to go without actually voiding.  She notes he has difficulties emptying his bladder including pushing/straining.  She reports trying to turn on the water to help aid with triggers to encourage emptying.  She reports he has not had any significant leakage during the day, has had 2 episodes of nocturnal enuresis overnight.  She notes that he has extreme difficulties voiding overnight, she states that he will wake up in attempt to pee for multiple hours, he can go back to sleep due to fear that he will wet the bed if he does not void.    She reports that end of August he developed a urinary tract infection was treated with doxycycline for 10 days.  She is not sure if the urinary tract infection completely resolved and wishes for additional testing at this time.    She reports that she feels as though patient would be a horrible surgical candidate.  She notes that he has previous history of vasectomy and still complains about it.    PVR: 558 mL    Medical History:   Diagnosis Date   ? Adverse effect     neuroleptic drugs   ? Autism    ? Constipation 10/28/2010   ? Dystonia    ? H. pylori infection    ? Headache(784.0) 08/26/2006   ? Heartburn symptom    ? History of fall     usually associated with urination or getting to feet   ? Hypothyroidism    ? Long term use of drug     antipsychotic drugs   ? Mental retardation    ? Rash     raised red rash, itches on belly, trunk, and underarms   ? SBO (small bowel obstruction) (HCC)    ? Syncope and collapse 08/26/2006   ? WBC decreased        Surgical History:   Procedure Laterality Date   ? ESOPHAGOGASTRODUODENOSCOPY ENDOSCOPIC ULTRASOUND & colonoscopy at [facility] only regarding weight loss N/A 06/17/2017    Performed by Bernita Buffy, MD at [facility] ENDO   ? COLONOSCOPY & EGD/EUS N/A 06/17/2017    Performed by Bernita Buffy, MD at [facility] ENDO   ? ESOPHAGOGASTRODUODENOSCOPY BIOPSY  06/17/2017    Performed by Bernita Buffy, MD at [facility] ENDO   ? COLONOSCOPY BIOPSY  06/17/2017    Performed by Bernita Buffy, MD at [facility] ENDO   ? BREATH HYDROGEN/ METHANE TESTING w/ glucose N/A 12/24/2017    Performed by Tim Lair, MD at [facility] ENDO   ? HX BUNIONECTOMY     ? HX TONSIL AND ADENOIDECTOMY     ?  HX VASECTOMY         Family History   Problem Relation Age of Onset   ? Other Father    ? Bipolar Disorder Father    ? Hypertension Father    ? Heart Attack Father    ? High Cholesterol Father    ? Stroke Father    ? Depression Father    ? Other Paternal Grandfather    ? Heart Attack Paternal Grandfather    ? Hypertension Paternal Grandfather    ? Stroke Paternal Grandfather    ? Depression Paternal Grandfather    ? Colon Polyps Mother    ? High Cholesterol Mother    ? Migraines Mother    ? Heart Attack Maternal Grandfather    ? Stroke Maternal Grandfather    ? Heart Attack Paternal Grandmother    ? Cancer-Colon Neg Hx        Current Outpatient Medications   Medication Sig Dispense Refill   ? aspirin EC 81 mg tablet Take 81 mg by mouth daily. Take with food.     ? atorvastatin (LIPITOR) 20 mg tablet Take 20 mg by mouth at bedtime daily.     ? bisacodyl (DULCOLAX) 5 mg tablet Take 1 Tab by mouth twice daily. 30 Tab 0   ? clonazePAM (KLONOPIN) 0.5 mg tablet TAKE ONE TABLET BY MOUTH EVERY MORNING 30 tablet 5   ? clonazePAM (KLONOPIN) 1 mg tablet TAKE ONE TABLET BY MOUTH EVERY DAY 30 tablet 5   ? CREON 24,000-76,000 -120,000 unit capsule TAKE 3 CAPSULES BY MOUTH THREE TIMES DAILY WITH MEALS 270 capsule 5   ? DOCOSAHEXANOIC ACID/EPA (FISH OIL PO) Take 2 Caps by mouth daily.     ? docusate (COLACE) 100 mg capsule Take 100 mg by mouth twice daily.     ? glucosamine(+) 500 mg tab Take 500 mg by mouth daily.     ? lamoTRIgine (LAMICTAL) 150 mg tablet TAKE ONE TABLET BY MOUTH TWICE DAILY 180 tablet 1   ? levothyroxine (SYNTHROID) 100 mcg tablet Take 100 mcg by mouth daily.     ? lithium carbonate (ESKALITH) 150 mg capsule Take 2 capsules by mouth every morning and 3 caps in the evening. Take with food. 450 capsule 1   ? lubiprostone (AMITIZA) 24 mcg cap TAKE ONE CAPSULE BY MOUTH TWICE DAILY WITH MEALS 60 capsule 5   ? magnesium citrate oral solution Take 296 mL by mouth as Needed.     ? magnesium 400 mEq twice daily.     ? OLANZapine (ZYPREXA ZYDIS) 5 mg rapid dissolve tablet Dissolve one tablet by mouth daily as needed. 30 tablet 3   ? OLANZapine (ZYPREXA) 20 mg tablet TAKE ONE TABLET BY MOUTH EVERY NIGHT AT BEDTIME 90 tablet 1   ? OLANZapine (ZYPREXA) 5 mg tablet TAKE ONE TABLET BY MOUTH EVERY MORNING 90 tablet 1   ? polyethylene glycol 3350 (GLYCOLAX; MIRALAX) 17 gram/dose powder Take 17 g by mouth twice daily.     ? propranoloL (INDERAL) 10 mg tablet TAKE ONE TABLET BY MOUTH TWICE DAILY 60 tablet 5   ? QUEtiapine (SEROQUEL) 100 mg tablet Take one tab in the morning (with 300mg  for total dose 400mg ) and one tab at 2pm (with 300mg  for total dose 400mg ) 180 tablet 4   ? QUEtiapine (SEROQUEL) 300 mg tablet Take one tab in the morning (with 100mg  tab for total dose 400mg ), one tab at 2pm (with 100mg  tab for total dose 400mg ), and two tabs  at HS 360 tablet 4   ? senna (SENOKOT) 8.6 mg tablet Take 2 Tabs by mouth twice daily.     ? tamsulosin (FLOMAX) 0.4 mg capsule Take one capsule by mouth twice daily. Take 30 min after same meal.  Do not cut/ crush/ chew. 60 capsule 11   ? temazepam (RESTORIL) 15 mg capsule TAKE ONE CAPSULE BY MOUTH EVERY NIGHT AT BEDTIME 30 capsule 5   ? temazepam (RESTORIL) 30 mg capsule TAKE ONE CAPSULE BY MOUTH AT BEDTIME AS NEEDED 30 capsule 5     No current facility-administered medications for this visit.       Allergies   Allergen Reactions   ? Diphenhydramine-Zinc Acetate AGITATION     Patient becomes agiitated and combative   ? Haloperidol Lactate AGITATION     Severe agitation per mother   ? Albuterol SEE COMMENTS     Extreme hyperactivity-per mom   ? Amitriptyline SEE COMMENTS     Uncontrollable muscle movements per mother   ? Antihistamine-1 AGITATION     Patient becomes agiitated and combative   ? Atomoxetine UNKNOWN   ? Bee Sting [Venom-Honey Bee] UNKNOWN   ? Chlorpheniram-Dm-Acetaminophen AGITATION     Patient becomes agiitated and combative   ? Clemastine UNKNOWN   ? Clozaril [Clozapine] AGITATION     Extreme anger/agitation   ? Depakote [Divalproex] SEE COMMENTS     Per mother worked well to control behavior, but caused liver tests to become abnormal so was stopped.     ? Erythromycin RASH     Allergy recorded in SMS: E-MYCIN~Reactions: RASH   ? Flurazepam UNKNOWN   ? Melatonin AGITATION   ? Phenylpropanolamine UNKNOWN   ? Pseudoephedrine UNKNOWN   ? Tripelennamine UNKNOWN   ? Triprolidine UNKNOWN   ? Zolpidem AGITATION       Social History     Socioeconomic History   ? Marital status: Single     Spouse name: Not on file   ? Number of children: Not on file   ? Years of education: Not on file   ? Highest education level: Not on file   Occupational History     Employer: [employer]   Tobacco Use   ? Smoking status: Never Smoker   ? Smokeless tobacco: Never Used   Substance and Sexual Activity   ? Alcohol use: No   ? Drug use: No   ? Sexual activity: Not on file   Other Topics Concern   ? Not on file   Social History Narrative   ? Not on file         Review of Systems   Constitutional: Negative for activity change, appetite change, chills, diaphoresis, fatigue, fever and unexpected weight change.   HENT: Negative for congestion, hearing loss, mouth sores and sinus pressure.    Eyes: Negative for visual disturbance.   Respiratory: Negative for apnea, cough, chest tightness and shortness of breath.    Cardiovascular: Negative for chest pain, palpitations and leg swelling.   Gastrointestinal: Negative for abdominal pain, blood in stool, constipation, diarrhea, nausea, rectal pain and vomiting.   Genitourinary: Negative for decreased urine volume, difficulty urinating, dysuria, enuresis, flank pain, frequency, genital sores, hematuria, penile discharge, penile pain, penile swelling, scrotal swelling, testicular pain and urgency.   Musculoskeletal: Negative for arthralgias, back pain, gait problem and myalgias.   Skin: Negative for rash and wound.   Neurological: Negative for dizziness, tremors, seizures, syncope, weakness, light-headedness, numbness and headaches.  Hematological: Negative for adenopathy. Does not bruise/bleed easily.   Psychiatric/Behavioral: Negative for decreased concentration and dysphoric mood. The patient is not nervous/anxious.        Objective:         ? aspirin EC 81 mg tablet Take 81 mg by mouth daily. Take with food.   ? atorvastatin (LIPITOR) 20 mg tablet Take 20 mg by mouth at bedtime daily.   ? bisacodyl (DULCOLAX) 5 mg tablet Take 1 Tab by mouth twice daily.   ? clonazePAM (KLONOPIN) 0.5 mg tablet TAKE ONE TABLET BY MOUTH EVERY MORNING   ? clonazePAM (KLONOPIN) 1 mg tablet TAKE ONE TABLET BY MOUTH EVERY DAY   ? CREON 24,000-76,000 -120,000 unit capsule TAKE 3 CAPSULES BY MOUTH THREE TIMES DAILY WITH MEALS   ? DOCOSAHEXANOIC ACID/EPA (FISH OIL PO) Take 2 Caps by mouth daily.   ? docusate (COLACE) 100 mg capsule Take 100 mg by mouth twice daily.   ? glucosamine(+) 500 mg tab Take 500 mg by mouth daily.   ? lamoTRIgine (LAMICTAL) 150 mg tablet TAKE ONE TABLET BY MOUTH TWICE DAILY   ? levothyroxine (SYNTHROID) 100 mcg tablet Take 100 mcg by mouth daily.   ? lithium carbonate (ESKALITH) 150 mg capsule Take 2 capsules by mouth every morning and 3 caps in the evening. Take with food.   ? lubiprostone (AMITIZA) 24 mcg cap TAKE ONE CAPSULE BY MOUTH TWICE DAILY WITH MEALS   ? magnesium citrate oral solution Take 296 mL by mouth as Needed.   ? magnesium 400 mEq twice daily.   ? OLANZapine (ZYPREXA ZYDIS) 5 mg rapid dissolve tablet Dissolve one tablet by mouth daily as needed.   ? OLANZapine (ZYPREXA) 20 mg tablet TAKE ONE TABLET BY MOUTH EVERY NIGHT AT BEDTIME   ? OLANZapine (ZYPREXA) 5 mg tablet TAKE ONE TABLET BY MOUTH EVERY MORNING   ? polyethylene glycol 3350 (GLYCOLAX; MIRALAX) 17 gram/dose powder Take 17 g by mouth twice daily.   ? propranoloL (INDERAL) 10 mg tablet TAKE ONE TABLET BY MOUTH TWICE DAILY   ? QUEtiapine (SEROQUEL) 100 mg tablet Take one tab in the morning (with 300mg  for total dose 400mg ) and one tab at 2pm (with 300mg  for total dose 400mg )   ? QUEtiapine (SEROQUEL) 300 mg tablet Take one tab in the morning (with 100mg  tab for total dose 400mg ), one tab at 2pm (with 100mg  tab for total dose 400mg ), and two tabs at HS   ? senna (SENOKOT) 8.6 mg tablet Take 2 Tabs by mouth twice daily.   ? tamsulosin (FLOMAX) 0.4 mg capsule Take one capsule by mouth twice daily. Take 30 min after same meal.  Do not cut/ crush/ chew.   ? temazepam (RESTORIL) 15 mg capsule TAKE ONE CAPSULE BY MOUTH EVERY NIGHT AT BEDTIME   ? temazepam (RESTORIL) 30 mg capsule TAKE ONE CAPSULE BY MOUTH AT BEDTIME AS NEEDED     Vitals:    04/25/20 0801   BP: (!) 133/96   Pulse: 65   Weight: 63 kg (139 lb)   Height: 167.6 cm (66)   PainSc: Four     Body mass index is 22.44 kg/m?Marland Kitchen       Physical Exam  Constitutional:       General: He is not in acute distress.     Appearance: He is well-developed. He is not diaphoretic.   HENT:      Head: Normocephalic and atraumatic.   Eyes:      General:  Right eye: No discharge.         Left eye: No discharge.      Conjunctiva/sclera: Conjunctivae normal.   Cardiovascular:      Rate and Rhythm: Normal rate.   Pulmonary:      Effort: Pulmonary effort is normal. No respiratory distress.   Musculoskeletal:         General: No deformity.      Cervical back: Normal range of motion.   Skin:     General: Skin is warm and dry.   Neurological:      Mental Status: He is alert and oriented to person, place, and time.   Psychiatric:         Behavior: Behavior normal.         Thought Content: Thought content normal.         Judgment: Judgment normal.            Assessment and Plan:    Problem   Lower Urinary Tract Symptoms (Luts)    Consult 05/27/17  #1 complaint - increased LUTS x 1 year  Chronic dysuria, Frequency every 30 minutes, urge and urge incontinence, occasionally wearing Depends 3-4 day, nocturia x 2  No trial of OAB medications    05/27/17   PVR 180 MLs, PVR #2 3cc       Lower urinary tract symptoms (LUTS)  54 year old male returns to clinic with his mother for ongoing evaluation of incomplete bladder emptying, urinary frequency.  Patient has been taking Flomax 0.4 mg BID daily following his last appointment and his mother notes no improvement with urination. She reports that his symptoms have worsened and he developed a UTI at the end of August.  His postvoid residual was significantly elevated at 558 compared to previous result at 273 mL.  ?  Discussed with patient and his mother additional options for ongoing incomplete emptying/frequency, specifically a referral to physician for additional evaluation and consideration of diagnostic evaluation/interventions based upon patient's ongoing chronic urinary symptoms, which appear to be worsening.  ?  We will plan to message Dr. Penelope Coop for review of patient's history and consideration for second opinion.  Patient currently taking Flomax twice daily, due to patient's history of schizophrenia, bipolar disorder, autism, ADHD, patient with most likely require sedation for any ongoing interventions.  Declined catheterization or clean intermittent catheterization based on previous traumatic history.         Orders Placed This Encounter   ? CULTURE-URINE W/SENSITIVITY Chief Financial Officer)     Return if symptoms worsen or fail to improve.      Abby Potash, APRN-NP  Department of Urology

## 2020-04-22 ENCOUNTER — Encounter: Admit: 2020-04-22 | Discharge: 2020-04-22 | Payer: MEDICARE

## 2020-04-22 DIAGNOSIS — F250 Schizoaffective disorder, bipolar type: Secondary | ICD-10-CM

## 2020-04-22 MED ORDER — LITHIUM CARBONATE 150 MG PO CAP
ORAL_CAPSULE | Freq: Every morning | 1 refills
Start: 2020-04-22 — End: ?

## 2020-04-25 ENCOUNTER — Encounter: Admit: 2020-04-25 | Discharge: 2020-04-25 | Payer: MEDICARE

## 2020-04-25 ENCOUNTER — Ambulatory Visit: Admit: 2020-04-25 | Discharge: 2020-04-25 | Payer: MEDICARE

## 2020-04-25 DIAGNOSIS — R55 Syncope and collapse: Secondary | ICD-10-CM

## 2020-04-25 DIAGNOSIS — G249 Dystonia, unspecified: Secondary | ICD-10-CM

## 2020-04-25 DIAGNOSIS — R51 Headache: Secondary | ICD-10-CM

## 2020-04-25 DIAGNOSIS — R399 Unspecified symptoms and signs involving the genitourinary system: Secondary | ICD-10-CM

## 2020-04-25 DIAGNOSIS — R12 Heartburn: Secondary | ICD-10-CM

## 2020-04-25 DIAGNOSIS — D72819 Decreased white blood cell count, unspecified: Secondary | ICD-10-CM

## 2020-04-25 DIAGNOSIS — Z9181 History of falling: Secondary | ICD-10-CM

## 2020-04-25 DIAGNOSIS — F840 Autistic disorder: Secondary | ICD-10-CM

## 2020-04-25 DIAGNOSIS — A048 Other specified bacterial intestinal infections: Secondary | ICD-10-CM

## 2020-04-25 DIAGNOSIS — K5900 Constipation, unspecified: Secondary | ICD-10-CM

## 2020-04-25 DIAGNOSIS — Z79899 Other long term (current) drug therapy: Secondary | ICD-10-CM

## 2020-04-25 DIAGNOSIS — E039 Hypothyroidism, unspecified: Secondary | ICD-10-CM

## 2020-04-25 DIAGNOSIS — K56609 Unspecified intestinal obstruction, unspecified as to partial versus complete obstruction: Secondary | ICD-10-CM

## 2020-04-25 DIAGNOSIS — F79 Unspecified intellectual disabilities: Secondary | ICD-10-CM

## 2020-04-25 DIAGNOSIS — R21 Rash and other nonspecific skin eruption: Secondary | ICD-10-CM

## 2020-04-25 NOTE — Progress Notes
PVR: 558 ml

## 2020-04-26 ENCOUNTER — Encounter: Admit: 2020-04-26 | Discharge: 2020-04-26 | Payer: MEDICARE

## 2020-04-26 DIAGNOSIS — R339 Retention of urine, unspecified: Secondary | ICD-10-CM

## 2020-04-26 NOTE — Telephone Encounter
Call patient's mother for update regarding ongoing management options for patient's incomplete bladder emptying/lower urine tract symptoms.      Discussed recommendations for pursuing renal bladder ultrasound and creatinine to assess for kidney disease.  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  Discussed additional management options including consideration for indwelling catheter or suprapubic catheter placement.  Patient's mother reports significant hesitation regarding catheterization based upon patient's underlying quality of life.  She reports that patient would be severely irritated secondary to catheter.    Discussed additional concerns regarding patient's underlying history of significant chronic intermittent constipation.  She reports his constipation has been well managed lately.  She does note that he takes a significant amount of medications in order to improve bowel movements.    Placed order for renal bladder ultrasound and CMP, will communicate results and update plan based upon findings.

## 2020-05-11 ENCOUNTER — Encounter: Admit: 2020-05-11 | Discharge: 2020-05-11 | Payer: MEDICARE

## 2020-05-11 DIAGNOSIS — F250 Schizoaffective disorder, bipolar type: Secondary | ICD-10-CM

## 2020-05-11 MED ORDER — OLANZAPINE 5 MG PO TBDI
ORAL_TABLET | Freq: Every day | ORAL | 3 refills | 30.00000 days | Status: AC | PRN
Start: 2020-05-11 — End: ?

## 2020-05-17 ENCOUNTER — Encounter: Admit: 2020-05-17 | Discharge: 2020-05-17 | Payer: MEDICARE

## 2020-07-11 ENCOUNTER — Encounter: Admit: 2020-07-11 | Discharge: 2020-07-11 | Payer: MEDICARE

## 2020-07-11 DIAGNOSIS — K5904 Chronic idiopathic constipation: Secondary | ICD-10-CM

## 2020-07-11 MED ORDER — LUBIPROSTONE 24 MCG PO CAP
ORAL_CAPSULE | Freq: Two times a day (BID) | ORAL | 5 refills | 90.00000 days | Status: AC
Start: 2020-07-11 — End: ?

## 2020-07-11 NOTE — Telephone Encounter
Refill request received for Amitiza  Last OV 10/29/19  Last fill 12/1719 60 caps w/5 refills    Routing to Dr. Jomarie Longs for approval/refusal

## 2020-08-03 ENCOUNTER — Encounter: Admit: 2020-08-03 | Discharge: 2020-08-03 | Payer: MEDICARE

## 2020-08-03 NOTE — Telephone Encounter
Patients mother left a message stating she was ill and was unable to bring the patient to his appointment as she is his guardian and transportation. She is requesting a returned call to reschedule his visit.

## 2020-08-16 ENCOUNTER — Encounter: Admit: 2020-08-16 | Discharge: 2020-08-16 | Payer: MEDICARE

## 2020-08-18 ENCOUNTER — Encounter: Admit: 2020-08-18 | Discharge: 2020-08-18 | Payer: MEDICARE

## 2020-09-05 ENCOUNTER — Encounter: Admit: 2020-09-05 | Discharge: 2020-09-05 | Payer: MEDICARE

## 2020-09-05 DIAGNOSIS — F250 Schizoaffective disorder, bipolar type: Secondary | ICD-10-CM

## 2020-09-05 MED ORDER — TEMAZEPAM 15 MG PO CAP
ORAL_CAPSULE | Freq: Every evening | 5 refills
Start: 2020-09-05 — End: ?

## 2020-09-05 MED ORDER — CLONAZEPAM 0.5 MG PO TAB
.5 mg | ORAL_TABLET | Freq: Every morning | ORAL | 5 refills
Start: 2020-09-05 — End: ?

## 2020-09-05 MED ORDER — CREON 24,000-76,000 -120,000 UNIT PO CPDR
ORAL_CAPSULE | Freq: Three times a day (TID) | ORAL | 5 refills | 30.00000 days | Status: AC
Start: 2020-09-05 — End: ?

## 2020-09-05 MED ORDER — PROPRANOLOL 10 MG PO TAB
10 mg | ORAL_TABLET | Freq: Two times a day (BID) | ORAL | 5 refills
Start: 2020-09-05 — End: ?

## 2020-09-20 ENCOUNTER — Encounter: Admit: 2020-09-20 | Discharge: 2020-09-20 | Payer: MEDICARE

## 2020-09-20 DIAGNOSIS — F250 Schizoaffective disorder, bipolar type: Secondary | ICD-10-CM

## 2020-09-20 MED ORDER — QUETIAPINE 100 MG PO TAB
ORAL_TABLET | Freq: Two times a day (BID) | 4 refills
Start: 2020-09-20 — End: ?

## 2020-09-20 MED ORDER — QUETIAPINE 300 MG PO TAB
ORAL_TABLET | Freq: Two times a day (BID) | 4 refills
Start: 2020-09-20 — End: ?

## 2020-10-06 ENCOUNTER — Encounter: Admit: 2020-10-06 | Discharge: 2020-10-06 | Payer: MEDICARE

## 2020-10-06 DIAGNOSIS — F250 Schizoaffective disorder, bipolar type: Secondary | ICD-10-CM

## 2020-10-06 MED ORDER — OLANZAPINE 20 MG PO TAB
20 mg | ORAL_TABLET | Freq: Every evening | ORAL | 1 refills | 30.00000 days | Status: AC
Start: 2020-10-06 — End: ?

## 2020-10-11 ENCOUNTER — Encounter: Admit: 2020-10-11 | Discharge: 2020-10-11 | Payer: MEDICARE

## 2020-10-11 DIAGNOSIS — G249 Dystonia, unspecified: Secondary | ICD-10-CM

## 2020-10-11 DIAGNOSIS — K56609 Unspecified intestinal obstruction, unspecified as to partial versus complete obstruction: Secondary | ICD-10-CM

## 2020-10-11 DIAGNOSIS — D72819 Decreased white blood cell count, unspecified: Secondary | ICD-10-CM

## 2020-10-11 DIAGNOSIS — R51 Headache: Secondary | ICD-10-CM

## 2020-10-11 DIAGNOSIS — K5900 Constipation, unspecified: Secondary | ICD-10-CM

## 2020-10-11 DIAGNOSIS — F840 Autistic disorder: Secondary | ICD-10-CM

## 2020-10-11 DIAGNOSIS — R12 Heartburn: Secondary | ICD-10-CM

## 2020-10-11 DIAGNOSIS — R002 Palpitations: Secondary | ICD-10-CM

## 2020-10-11 DIAGNOSIS — R21 Rash and other nonspecific skin eruption: Secondary | ICD-10-CM

## 2020-10-11 DIAGNOSIS — R55 Syncope and collapse: Secondary | ICD-10-CM

## 2020-10-11 DIAGNOSIS — Z79899 Other long term (current) drug therapy: Secondary | ICD-10-CM

## 2020-10-11 DIAGNOSIS — Z9181 History of falling: Secondary | ICD-10-CM

## 2020-10-11 DIAGNOSIS — F79 Unspecified intellectual disabilities: Secondary | ICD-10-CM

## 2020-10-11 DIAGNOSIS — A048 Other specified bacterial intestinal infections: Secondary | ICD-10-CM

## 2020-10-11 DIAGNOSIS — E039 Hypothyroidism, unspecified: Secondary | ICD-10-CM

## 2020-10-11 NOTE — Progress Notes
Date of Service: 10/11/2020    Chad Palmberg. is a 55 y.o. male.       HPI    Chad Randall is followed for autism spectrum disorder, intellectual disability, and schizoaffective disorder bipolar type.  He has been treated in the past for infrequent palpitations.  He has been maintained on a small dose of propranolol which controls his palpitations fairly well.   His palpitations are not associated with lightheadedness, presyncope or syncope.?His mother takes?superb care of him. Otherwise, the patient has been doing well and reports no angina, congestive symptoms, sensation of sustained forceful heart pounding, lightheadedness or syncope. His exercise tolerance has been stable. ?His mother indicates that he is very active and walks long distances without any difficulty. The patient reports no myalgias, bleeding abnormalities, or strokelike symptoms.??He likes to watch television. He had a fall on July 06, 2016 but reports no recurrence.  Chad Randall mother checks his blood pressure at home periodically and his blood pressures been very well controlled recently.  His pulse usually runs 70-80 bpm.  ?         Vitals:    10/11/20 1051   BP: 102/62   BP Source: Arm, Right Upper   Patient Position: Sitting   Pulse: 73   SpO2: 96%   Weight: 59.4 kg (131 lb)   Height: 167.6 cm (5' 6)   PainSc: Zero     Body mass index is 21.14 kg/m?Marland Kitchen     Past Medical History  Patient Active Problem List    Diagnosis Date Noted   ? Vitamin D deficiency 01/16/2018   ? Rash 01/16/2018   ? Muscular atrophy 01/16/2018   ? Lower urinary tract symptoms (LUTS) 05/28/2017     Consult 05/27/17  #1 complaint - increased LUTS x 1 year  Chronic dysuria, Frequency every 30 minutes, urge and urge incontinence, occasionally wearing Depends 3-4 day, nocturia x 2  No trial of OAB medications    05/27/17   PVR 180 MLs, PVR #2 3cc     ? Gross hematuria 05/28/2017     Intermittent gross hematuria     ? Weight loss 09/05/2016     Added automatically from request for surgery [phone]     ? Sleep disorder, circadian, irregular sleep-wake type 10/07/2014   ? Heart palpitations 06/03/2014   ? Atypical chest pain 06/03/2014   ? Incomplete right bundle branch block 06/03/2014   ? Autism spectrum disorder with accompanying language impairment and intellectual disability, requiring substantial support 09/08/2013   ? Bowel obstruction (HCC) 09/08/2013   ? Encounter for long-term (current) use of other medications 03/04/2012   ? Constipation 10/28/2010     Working with GI  BM every 5 days       ? Schizoaffective disorder, bipolar type (HCC) 12/22/2008   ? ADHD (attention deficit hyperactivity disorder), combined type 08/20/2008   ? Delirium 07/29/2008   ? Lithium adverse reaction 07/22/2008   ? Acute hypernatremia 07/22/2008   ? 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 Abdominal pain 07/22/2008   ? Nonspecific ST-T wave electrocardiographic changes 07/22/2008   ? Disturbance of skin sensation 08/26/2006   ? Syncope and collapse 08/26/2006         Review of Systems   Constitutional: Negative.   HENT: Negative.    Eyes: Negative.    Cardiovascular: Negative.    Respiratory: Negative.    Endocrine: Negative.    Hematologic/Lymphatic: Negative.    Skin: Negative.    Musculoskeletal:  Negative.    Gastrointestinal: Negative.    Genitourinary: Negative.    Neurological: Negative.    Psychiatric/Behavioral: Negative.    Allergic/Immunologic: Negative.        Physical Exam  GENERAL: The patient is?slender but?resting comfortably and in no distress.   HEENT: No abnormalities of the visible oro-nasopharynx, conjunctiva or sclera are noted.  NECK: There is no jugular venous distension. Carotids are palpable and without bruits. There is no thyroid enlargement.  Chest: Lung fields are clear to auscultation. There are no wheezes or crackles.  CV: There is a regular rhythm. The first and second heart sounds are normal. There are no murmurs, gallops or rubs.  His apical heart rate is 76 bpm  ABD: The abdomen is soft and supple with normal bowel sounds. There is no hepatosplenomegaly, ascites, tenderness, masses or bruits.  Neuro: There are no focal motor defects. Ambulation is normal. Cognitive function?is abnormal.??He does follow commands promptly and can answer simple questions with a phrase.  Ext:?There is no edema or evidence of deep vein thrombosis. Peripheral pulses are satisfactory. ?  SKIN:?There are no rashes and no cellulitis  PSYCH:?The patient is calm, rationale and oriented    Cardiovascular Studies  A twelve-lead ECG was obtained on 10/11/2020 reveals normal sinus rhythm with a heart rate of 73 bpm.  Right bundle branch block is noted.    Long-term event monitor 11/22/2017:  An event monitor was obtained to evaluate for symptoms of palpitations.  The duration of the event monitor was 6 days and 20 hours. ?The underlying heart rhythm is normal sinus rhythm with an average heart rate of 76 bpm. ?The heart rate range is 51 to 130 bpm. ?Rare isolated premature supraventricular ectopic beats are noted with a total of 39 recorded throughout the entire monitoring period.??There were no paired premature supraventricular ectopic beats and there was no supraventricular tachycardia seen, either nonsustained or sustained. ?Rare isolated ventricular ectopy was noted with a total of 55 ventricular ectopic beats recorded throughout the entire monitoring period.??There were no paired premature ventricular ectopic beats and there was no ventricular tachycardia seen, either nonsustained or sustained. ?There was no ventricular bigeminy or trigeminy seen. ?There were no significant pauses noted. ?No second or third-degree AV block was noted. ?There was no atrial fibrillation or atrial flutter seen. ?The patient did report symptoms: Symptoms of fluttering/racing/lightheadedness/dizziness were reported at 8:30 PM on 11/13/2017 and this correlated with sinus rhythm with a heart rate of 91 bpm and no arrhythmias or ectopy were noted at the time. ?Symptoms of pounding/dizziness were reported at 10:17 PM on 11/14/2017. ?The heart rhythm at the time was sinus rhythm with a rate of 80 bpm no arrhythmias or ectopy?were noted at the time. ?Symptoms of fluttering/racing/pounding/chest pain-pressure or reported at 8 AM on 11/17/2017. ?The heart rhythm at the time was sinus rhythm with a rate of 83 bpm and no ectopy or arrhythmias?were noted at the time. ?Symptoms of fluttering/racing/dizziness/chest pain-pressure/anxiety were?reported at 9:40 AM on 11/17/2017. ?The heart rhythm at the time was normal sinus rhythm with a rate of 99 bpm and no ectopy or arrhythmias?were noted at the time. ?Symptoms of fluttering/racing/anxiety were reported at 9 AM on 11/18/2017. ?The heart rhythm at the time was normal sinus rhythm with a rate of 90 bpm and no ectopy or arrhythmias?were noted at the time. ?A triggered event at 10:16 PM on 11/14/2017 correlated with sinus rhythm with heart rate of 77 bpm. ?No ectopy or arrhythmias were noted at the time.??A?triggered event  at 9:41 AM on 11/17/2017 correlated?with sinus rhythm with a heart rate of 98 bpm. ?No ectopy or arrhythmias were noted at the time. ?A triggered event at 9:10 AM on 11/18/2017 correlated?with normal sinus rhythm with a heart rate of 82 bpm. ?No arrhythmias or ectopy were noted at the time.    Cardiovascular Health Factors  Vitals BP Readings from Last 3 Encounters:   10/11/20 102/62   04/25/20 (!) 133/96   01/06/20 (!) 123/96     Wt Readings from Last 3 Encounters:   10/11/20 59.4 kg (131 lb)   04/25/20 63 kg (139 lb)   01/06/20 59 kg (130 lb)     BMI Readings from Last 3 Encounters:   10/11/20 21.14 kg/m?   04/25/20 22.44 kg/m?   01/06/20 20.98 kg/m?      Smoking Social History     Tobacco Use   Smoking Status Never Smoker   Smokeless Tobacco Never Used      Lipid Profile Cholesterol   Date Value Ref Range Status   07/02/2018 118 <200 MG/DL Final     HDL   Date Value Ref Range Status   07/02/2018 44 >40 MG/DL Final     LDL   Date Value Ref Range Status   07/02/2018 58 <100 mg/dL Final     Triglycerides   Date Value Ref Range Status   07/02/2018 72 <150 MG/DL Final      Blood Sugar Hemoglobin A1C   Date Value Ref Range Status   07/02/2018 5.3 4.0 - 6.0 % Final     Comment:     The ADA recommends that most patients with type 1 and type 2 diabetes maintain   an A1c level <7%.       Glucose   Date Value Ref Range Status   03/07/2020 116 (H) 70 - 105 Final   07/02/2018 86 70 - 100 MG/DL Final   16/04/9603 68 (L) 70 - 100 MG/DL Final   54/03/8118 98 70 - 110 MG/DL Final     Glucose, POC   Date Value Ref Range Status   09/08/2013 114 (H) 70 - 100 MG/DL Final   14/78/2956 82 70 - 100 MG/DL Final   21/30/8657 846 (H) 70 - 100 MG/DL Final          Problems Addressed Today  Encounter Diagnoses   Name Primary?   ? Palpitation Yes       Assessment and Plan     Chad Randall appears stable from a cardiovascular perspective.  His blood pressure appears well controlled when checked at home.  His palpitations likewise appear to be under good control.  I told the patient's mother that he can always take an extra 10 mg of propranolol if his palpitations increase.  No other changes in his medical regimen were made today. I have asked him to return for follow-up in 12 months.         Current Medications (including today's revisions)  ? aspirin EC 81 mg tablet Take 81 mg by mouth daily. Take with food.   ? atorvastatin (LIPITOR) 20 mg tablet Take 20 mg by mouth at bedtime daily.   ? bisacodyl (DULCOLAX) 5 mg tablet Take 1 Tab by mouth twice daily.   ? clonazePAM (KLONOPIN) 0.5 mg tablet TAKE ONE TABLET BY MOUTH EVERY MORNING   ? clonazePAM (KLONOPIN) 1 mg tablet TAKE ONE TABLET BY MOUTH EVERY DAY   ? CREON 24,000-76,000 -120,000 unit capsule TAKE 3 CAPSULES BY MOUTH  THREE TIMES DAILY WITH MEALS   ? DOCOSAHEXANOIC ACID/EPA (FISH OIL PO) Take 2 Caps by mouth daily.   ? docusate (COLACE) 100 mg capsule Take 100 mg by mouth twice daily.   ? glucosamine(+) 500 mg tab Take 500 mg by mouth daily.   ? lamoTRIgine (LAMICTAL) 150 mg tablet TAKE ONE TABLET BY MOUTH TWICE DAILY   ? levothyroxine (SYNTHROID) 100 mcg tablet Take 100 mcg by mouth daily.   ? lithium carbonate (ESKALITH) 150 mg capsule TAKE TWO CAPSULES BY MOUTH EVERY MORNING AND THREE CAPSULES EVERY EVENING with food   ? lubiprostone (AMITIZA) 24 mcg capsule TAKE ONE CAPSULE BY MOUTH TWICE DAILY WITH MEALS   ? magnesium citrate oral solution Take 296 mL by mouth as Needed.   ? magnesium 400 mEq twice daily.   ? OLANZapine (ZYPREXA ZYDIS) 5 mg rapid dissolve tablet dissolve ONE tab UNDER THE TONGUE DAILY AS NEEDED   ? OLANZapine (ZYPREXA) 20 mg tablet Take one tablet by mouth at bedtime daily.   ? OLANZapine (ZYPREXA) 5 mg tablet TAKE ONE TABLET BY MOUTH EVERY MORNING   ? polyethylene glycol 3350 (GLYCOLAX; MIRALAX) 17 gram/dose powder Take 17 g by mouth twice daily.   ? propranoloL (INDERAL) 10 mg tablet TAKE ONE TABLET BY MOUTH TWICE DAILY   ? QUEtiapine (SEROQUEL) 100 mg tablet TAKE ONE TABLET BY MOUTH TWICE DAILY (morning AND 2pm) along with 300mg  tab FOR daily DOSE of 400mg    ? QUEtiapine (SEROQUEL) 300 mg tablet TAKE ONE TABLET BY MOUTH TWICE DAILY (morning AND 2pm along with 100mg  tab) AND TWO tabs EVERY NIGHT AT BEDTIME   ? senna (SENOKOT) 8.6 mg tablet Take 2 Tabs by mouth twice daily.   ? tamsulosin (FLOMAX) 0.4 mg capsule Take one capsule by mouth twice daily. Take 30 min after same meal.  Do not cut/ crush/ chew.   ? temazepam (RESTORIL) 15 mg capsule TAKE ONE CAPSULE BY MOUTH EVERY NIGHT AT BEDTIME   ? temazepam (RESTORIL) 30 mg capsule TAKE ONE CAPSULE BY MOUTH AT BEDTIME AS NEEDED

## 2020-10-11 NOTE — Patient Instructions
Follow up as directed.  Call sooner if issues.  Call the [facility] nursing line at [phone].  Leave a detailed message for the nurse in [facility] with how we can assist you and we will call you back.

## 2020-10-17 ENCOUNTER — Encounter: Admit: 2020-10-17 | Discharge: 2020-10-17 | Payer: MEDICARE

## 2020-10-17 DIAGNOSIS — F250 Schizoaffective disorder, bipolar type: Secondary | ICD-10-CM

## 2020-10-17 MED ORDER — LAMOTRIGINE 150 MG PO TAB
150 mg | ORAL_TABLET | Freq: Two times a day (BID) | ORAL | 1 refills
Start: 2020-10-17 — End: ?

## 2020-10-17 MED ORDER — LITHIUM CARBONATE 150 MG PO CAP
ORAL_CAPSULE | Freq: Every morning | 1 refills
Start: 2020-10-17 — End: ?

## 2020-11-10 NOTE — Progress Notes
Outpatient Psychiatry Progress Note    Subjective:      Chad Randall is a 55 yo male with a PMHx of schizoaffective?disorder, bipolar type, autism spectrum disorder, ADHD, mild intellectual disability, aggression, and Prader-Willi syndrome who presents for follow up appointment today. Usama was last seen on 08/28/19 by Dr. Karen Kays at which time Zyprexa was reduced from 10mg  Qam and 20mg  QHS to 5mg  Qam and 20mg  QHS and Zydis 5mg  Qday PRN . Seroquel 400/400/600mg , Klonopin 0.5/1mg  QHS, Lithium 300mg  Qam and 450mg  QHS, Lamictal 150mg  BID, Restoril 45mg  QHS, and propranolol 10mg  BID were continued without change. Metabolic labs last completed 10/2019.    Patient presents today accompanied by his mother, Nikolay Demetriou, who provides most of history.    Patient's mother states that patient has not been to the office for approximately 1 year due to a string of unfortunate circumstances including her undergoing a major surgery with bowel resection, multiple deaths in the family, patient's father having health issues as well.    She states that overall things have been going well over the past year.  She states that they did move houses about 3 months ago which was a significant disruption in patient's environment and daily routine.  This seemed to lead to exacerbation of behavioral issues due to overstimulation.  She states that over the past few weeks it has been significantly better as he has settled into his new apartment.    She feels that overall the medications are helping.  Patient does not engaging in day services and has not for the past 1 year due to COVID-19 pandemic.  Currently, day services require mask wearing, which patient cannot tolerate.  In lieu of day services, patient has been engaging in activities at home throughout the day.    Kathie Rhodes states that patient's behaviors have been mostly well controlled recently.  She feels that patient has gained some insight into his behaviors and knows what is right versus wrong.  They have been utilizing constant praise and redirection when he exhibits poor behavior.  She states that he is tolerating his medication regimen very well.  They utilize Zydis approximately 2-3 times per week, though this was previously on a daily basis.    She denies there being any acute safety concerns such as suicidal behavior, physically aggressive behavior, or uncontrolled psychotic symptoms.    Discussed extensively Mandeep's current medication regimen with patient and mother.  Discussed risk of weight gain, QTC prolongation, arrhythmia, seizure, renal/thyroid abnormalities, excess sedation, fall risk.  Patient and mother are aware of these risks and are happy with current medication regimen.  They do not wish to have any changes today.    Past Medication Trials:?  Has been off Lithium in the past things got worse  Clozaril (neutropenia)  Depakote  Loxapine  Trileptal  Ritalin(for several years as a child)  Elavil (worse)  Strattera-arm dystonias  Flurazepam  Ambien  Melatonin   Wellbutrin   tegretol  Neurontin  ?- Antipsychotic monotherapy ineffective in the past      Psychiatric history update:  ?Inpatient admissions: First admission was at age 65.  Was later hospitalized in [facility] for 6 years from age 53[facility]19.  Was last hospitalized at [facility] in May 2010. Last hospitalized in 2015.  ?Patient has a history of progressive behaviors  ?History of lithium toxicity in 2009 [place] [facility] admission  ?  Social History Update:  - He enjoys coloring and playing with toy cars  - Lives with  mother, father, brother    Substance Use Hx Update:  -Tobacco: denies  -Alcohol: denies  -Illicit substances: denies      Review of Systems   Respiratory: Negative for shortness of breath.    Cardiovascular: Negative for chest pain.   Gastrointestinal: Negative for abdominal pain.   Neurological: Negative for headaches.   Psychiatric/Behavioral: Negative for hallucinations and suicidal ideas. Objective:         ? aspirin EC 81 mg tablet Take 81 mg by mouth daily. Take with food.   ? atorvastatin (LIPITOR) 20 mg tablet Take 20 mg by mouth at bedtime daily.   ? bisacodyl (DULCOLAX) 5 mg tablet Take 1 Tab by mouth twice daily.   ? clonazePAM (KLONOPIN) 0.5 mg tablet TAKE ONE TABLET BY MOUTH EVERY MORNING   ? clonazePAM (KLONOPIN) 1 mg tablet TAKE ONE TABLET BY MOUTH EVERY DAY   ? CREON 24,000-76,000 -120,000 unit capsule TAKE 3 CAPSULES BY MOUTH THREE TIMES DAILY WITH MEALS   ? DOCOSAHEXANOIC ACID/EPA (FISH OIL PO) Take 2 Caps by mouth daily.   ? docusate (COLACE) 100 mg capsule Take 100 mg by mouth twice daily.   ? glucosamine(+) 500 mg tab Take 500 mg by mouth daily.   ? lamoTRIgine (LAMICTAL) 150 mg tablet Take one tablet by mouth twice daily.   ? levothyroxine (SYNTHROID) 100 mcg tablet Take 100 mcg by mouth daily.   ? lithium carbonate (ESKALITH) 150 mg capsule Take with food. Take 2 capsules in the morning and 3 capsules at bed time   ? lubiprostone (AMITIZA) 24 mcg capsule TAKE ONE CAPSULE BY MOUTH TWICE DAILY WITH MEALS   ? magnesium citrate oral solution Take 296 mL by mouth as Needed.   ? magnesium 400 mEq twice daily.   ? OLANZapine (ZYPREXA ZYDIS) 5 mg rapid dissolve tablet Dissolve one tablet by mouth daily as needed.   ? OLANZapine (ZYPREXA) 20 mg tablet Take one tablet by mouth at bedtime daily.   ? OLANZapine (ZYPREXA) 5 mg tablet Take one tablet by mouth every morning.   ? polyethylene glycol 3350 (GLYCOLAX; MIRALAX) 17 gram/dose powder Take 17 g by mouth twice daily.   ? propranoloL (INDERAL) 10 mg tablet TAKE ONE TABLET BY MOUTH TWICE DAILY   ? QUEtiapine (SEROQUEL) 100 mg tablet TAKE ONE TABLET BY MOUTH TWICE DAILY (morning AND 2pm) along with 300mg  tab FOR daily DOSE of 400mg    ? QUEtiapine (SEROQUEL) 300 mg tablet TAKE ONE TABLET BY MOUTH TWICE DAILY (morning AND 2pm along with 100mg  tab) AND TWO tabs EVERY NIGHT AT BEDTIME   ? senna (SENOKOT) 8.6 mg tablet Take 2 Tabs by mouth twice daily.   ? tamsulosin (FLOMAX) 0.4 mg capsule Take one capsule by mouth twice daily. Take 30 min after same meal.  Do not cut/ crush/ chew.   ? temazepam (RESTORIL) 15 mg capsule TAKE ONE CAPSULE BY MOUTH EVERY NIGHT AT BEDTIME   ? temazepam (RESTORIL) 30 mg capsule TAKE ONE CAPSULE BY MOUTH AT BEDTIME AS NEEDED     Vitals:    11/11/20 1100   BP: 102/70   BP Source: Arm, Left Upper   Pulse: 72   SpO2: 98%   PainSc: Zero   Weight: 59.4 kg (131 lb)     Body mass index is 21.14 kg/m?Marland Kitchen     Physical Exam  Psychiatric:      Comments: MENTAL STATUS EXAMINATION  General/Constitutional: appears stated age, dressed in personal clothes, fair grooming  Eye Contact: minimal  Behavior: Calm, aloof  Speech: RRR with normal volume and tone. Good articulation  Mood: ok  Affect: euthymic ; mood congruent  Thought Process: circumstantial  Thought Content: denies SI, HI. No evidence of delusions  Perception: Denies AVH  Associations: Intact  Insight/Judgment: Limited           Metabolic monitoring:  Metabolic monitoring:     Body mass index is 21.14 kg/m?Marland Kitchen  Wt Readings from Last 3 Encounters:   11/11/20 59.4 kg (131 lb)   10/11/20 59.4 kg (131 lb)   04/25/20 63 kg (139 lb)     BP Readings from Last 3 Encounters:   11/11/20 102/70   10/11/20 102/62   04/25/20 (!) 133/96     Lab Results   Component Value Date    CHOL 118 07/02/2018    TRIG 72 07/02/2018    HDL 44 07/02/2018    LDL 58 07/02/2018    VLDL 14 07/02/2018    NONHDLCHOL 74 07/02/2018    CHOLHDLC 3 08/02/2016     Hemoglobin A1C   Date Value Ref Range Status   07/02/2018 5.3 4.0 - 6.0 % Final     Comment:     The ADA recommends that most patients with type 1 and type 2 diabetes maintain   an A1c level <7%.         AIMS  No data recorded      Assessment and Plan:  Brex Hardister. is a 55 y.o. male with the below diagnoses. Today, patient presents for stability of symptoms on current medication regimen.  Discussed extensively with patient and his mother that this medication regimen is quite extensive and with some redundancy.  Discussed the risks of this regimen as documented above.  However, it seems that gaining control over patient's behavioral challenges has been quite difficult over time resulting in this regimen.  For now, will obtain monitoring labs and continue to assess need for these medications and follow-up.    1. Autism spectrum disorder with accompanying language impairment and intellectual disability, requiring substantial support    2. Schizoaffective disorder, bipolar type (HCC)    3. Encounter for long-term current use of medication    4. ADHD (attention deficit hyperactivity disorder), combined type        Plan:  -No medication changes today, continue all current medications as follows:   - Olanzapine 5 mg every morning and 20 mg nightly for mood and agitation; Zydis 5 mg daily as needed for agitation   - Quetiapine 400/400/600 mg daily for mood and agitation   - Clonazepam 0.5 mg every morning and 1 mg nightly for anxiety   - Temazepam 45 mg nightly for sleep   - Lithium 300 mg every morning and 450 mg nightly for mood and agitation   - Lamotrigine 150 mg twice daily for mood and agitation   - Propranolol 10 mg twice daily for akathisia  -Monitoring labs ordered today: CMP, TSH, lipid panel, hemoglobin A1c, lithium level; to be completed at outside lab and faxed to [facility]  -Most recent EKG: 10/11/2020; QTC 470  -Aims 11/11/2020: 1 for tongue fasciculations (unchanged from previous)    Discussion  1. The proposed treatment plan was discussed with the patient who was provided the opportunity to actively take part finalizing the current treatment plan.   ? Discussed risks, benefits and potential side effects of medications with patient and guardian as well as alternative treatments  ? Discussed relevant black box warnings   ?  patient reported understanding of the risks, benefits and possible side effects and provided consent for treatment unless otherwise stated  2. Discussed contingency/emergency plans with the patient should there be concern for attempting suicide, committing self-harm or other potential injuries to self or others  1. These plans include:  ? Contacting the mental health clinic (calling, walk-in, etc.)  ? Calling 911 or calling the crisis line  ? Visiting the ER at any time    Return to clinic in 3 months    Patient discussed with Dr. Epimenio Sarin    Safety plan discussed at length and outlined in detail on after visit summary    The proposed treatment plan was discussed with the patient/guardian who was provided the opportunity to ask questions and make suggestions regarding alternative treatment.     This note was composed with assistance of Office manager.  There may be transcriptional errors. If you have questions regarding the note please reach out to Dr. Wilma Flavin directly

## 2020-11-10 NOTE — Patient Instructions
- We are not making any medication changes today. Continue all current psychiatric medications as prescribed.   - Please go to the lab to have some monitoring bloodwork done: CMP, TSH, lipid panel, A1c, and Lithium level    Return to clinic in 3 months.  Please call the clinic to reschedule or cancel appointments if somethings changes.     If you need a medication refill, please call your pharmacy to request refills.     In the event of a safety concern or suicidal thoughts, call 911 or go to the nearest emergency room. National Suicide Prevention Lifeline 304-709-3030 (Talk). Crisis Text Hotline (text 423-239-6980).     The Compassionate Ear phone line is a resource for talk support in non-emergency situations (1-866-WARMEAR).    Please do not hesitate to call the clinic, if you have questions regarding your psychiatric medications or start to develop side effects to medications prescribed by your psychiatrist.     Please take your medicines as prescribed. If you have questions about your medications or treatment, please call the clinic during regular business hours. You may also call the pharmacy directly for refills.    In case of emergency, for example if you develop suicidal intents or homicidal intents, call 911.     Therapy:  Online:  -SunReplacement.co.uk  -[email or web address].talkspace.com    In-person:  -Call the number on the back of your insurance card to ask for a list of in-network providers  -[facility] [place] [facility] (request a psychotherapy packet)  -[facility]  -Depending on your county of residence: [facility], [facility], [facility] [place], [facility], or [facility].  [facility]  -visit the Psychology Today website and enter your ZIP code to find independently practicing or group practicing licensed clinical social workers or psychologists.    Intensive Outpatient Programs (sleep at home, but have intensive therapy a few times per week):  -[facility] [phone]  -[facility] [phone]    Managing Fears and Anxiety around Coronavirus:  Common reactions to uncertainty about Coronavirus range widely, but may include anxiety/worry, feelings of helplessness, social withdrawal, difficulty concentrating and sleeping, anger, or hyper-vigilance to your health and body.  To manage fears and anxiety, it is recommended that you:    Download the Covid Coach: https://www.garcia-mcintosh.com/      1. Stay informed using the CDC website at DeathUnit.nl      2. Limit worry by lessening the time spent watching or listening to upsetting media coverage.  Remember to take breaks from the news and focus upon things that are positive in your life and that you have control over.     3. Be mindful of your assumptions about others.  If someone coughs or has a fever, there are many potential causes that do not include Coronavirus.     4. Maintaining social connections helps create a sense of normalcy and provides valuable outlets for sharing feelings and relieving stress.  With CDC social distancing recommendations, this may include online video chat or other social media apps.      5. Consider reviewing the CDC's Mental Health and Coping During COVID-19 website at http://www.jones.org/    Other Resources:  [employer] on Mental Illness: https://www.nami.org   Substance Abuse and Mental Health Services Administration: BirthdayFever.at   National human trafficking hotline: 303-822-8111 or text (724)217-8147. Live chat also available online at PoliticalMakeover.com.ee. You can reach the Hotline 24 hours a day, 7  days a week in more than 200 languages. All calls are confidential and answered live by highly trained Anti-Trafficking Hotline Advocates.  National Domestic Violence Hotline: 1- 564-841-9385  National Suicide Prevention Lifeline: 1-800-273-TALK 782-561-8487)  National Hopeline Network: 1-800-SUICIDE 979-634-9910)  Crisis Text Line: Text HOME TO 361-088-6257  Lifeline Crisis Chat (Online live messaging): DealerSpiff.com.pt  Self-Harm Hotline: 1-800-DONT CUT 307-291-3579)  Veterans Crisis Line: https://www.veteranscrisisline.net  Suicide Prevention Wiki: http://suicideprevention.wikia.com

## 2020-11-11 ENCOUNTER — Encounter: Admit: 2020-11-11 | Discharge: 2020-11-11 | Payer: MEDICARE

## 2020-11-11 ENCOUNTER — Ambulatory Visit: Admit: 2020-11-11 | Discharge: 2020-11-12 | Payer: MEDICARE

## 2020-11-11 DIAGNOSIS — K5900 Constipation, unspecified: Secondary | ICD-10-CM

## 2020-11-11 DIAGNOSIS — Z79899 Other long term (current) drug therapy: Secondary | ICD-10-CM

## 2020-11-11 DIAGNOSIS — R55 Syncope and collapse: Secondary | ICD-10-CM

## 2020-11-11 DIAGNOSIS — R51 Headache: Secondary | ICD-10-CM

## 2020-11-11 DIAGNOSIS — A048 Other specified bacterial intestinal infections: Secondary | ICD-10-CM

## 2020-11-11 DIAGNOSIS — F840 Autistic disorder: Secondary | ICD-10-CM

## 2020-11-11 DIAGNOSIS — F79 Unspecified intellectual disabilities: Secondary | ICD-10-CM

## 2020-11-11 DIAGNOSIS — R21 Rash and other nonspecific skin eruption: Secondary | ICD-10-CM

## 2020-11-11 DIAGNOSIS — Z9181 History of falling: Secondary | ICD-10-CM

## 2020-11-11 DIAGNOSIS — F902 Attention-deficit hyperactivity disorder, combined type: Secondary | ICD-10-CM

## 2020-11-11 DIAGNOSIS — D72819 Decreased white blood cell count, unspecified: Secondary | ICD-10-CM

## 2020-11-11 DIAGNOSIS — G249 Dystonia, unspecified: Secondary | ICD-10-CM

## 2020-11-11 DIAGNOSIS — K56609 Unspecified intestinal obstruction, unspecified as to partial versus complete obstruction: Secondary | ICD-10-CM

## 2020-11-11 DIAGNOSIS — R12 Heartburn: Secondary | ICD-10-CM

## 2020-11-11 DIAGNOSIS — E039 Hypothyroidism, unspecified: Secondary | ICD-10-CM

## 2020-11-11 MED ORDER — LAMOTRIGINE 150 MG PO TAB
150 mg | ORAL_TABLET | Freq: Two times a day (BID) | ORAL | 1 refills | Status: AC
Start: 2020-11-11 — End: ?

## 2020-11-11 MED ORDER — LITHIUM CARBONATE 150 MG PO CAP
ORAL_CAPSULE | ORAL | 3 refills | 90.00000 days | Status: AC
Start: 2020-11-11 — End: ?

## 2020-11-11 MED ORDER — OLANZAPINE 5 MG PO TAB
5 mg | ORAL_TABLET | Freq: Every morning | ORAL | 1 refills | 30.00000 days | Status: AC
Start: 2020-11-11 — End: ?

## 2020-11-11 MED ORDER — OLANZAPINE 5 MG PO TBDI
5 mg | ORAL_TABLET | Freq: Every day | ORAL | 3 refills | 30.00000 days | Status: AC | PRN
Start: 2020-11-11 — End: ?

## 2020-11-11 MED ORDER — LITHIUM CARBONATE 150 MG PO CAP
ORAL_CAPSULE | ORAL | 1 refills | 90.00000 days | Status: DC
Start: 2020-11-11 — End: 2020-11-11

## 2020-11-11 MED ORDER — OLANZAPINE 20 MG PO TAB
20 mg | ORAL_TABLET | Freq: Every evening | ORAL | 1 refills | 30.00000 days | Status: AC
Start: 2020-11-11 — End: ?

## 2020-11-11 NOTE — Progress Notes
Discussed risks of dual antipsychotic treatment, particularly above max dose of Seroquel. Reviewed EKG,last obtained October 11 2020, QTc 470. Discussed risk of HLD, stroke, seizure to which pt and family expressed understanding. Will order labwork as outlined by resident. Mother admits she is familiar with his psychiatric medication regiment as she, "Has been dealing with this since he was [age]". Mother and pt expressed understanding of risk vs. benefit of current med regiment. No safety concerns at this time. Labwork printed out and to be done in [employer] as pt has already taken his lithium this morning and had breakfast. Further recs as per resident.     I saw and examined the patient at time of visit in person. Reviewed the notes and discussed with resident. I personally performed the key portions of the E/M visit, have discussed the patient with and concur with resident documentation of history, mental status exam, assessment and treatment plan.     Anson Crofts, MD        11/11/2020

## 2020-11-11 NOTE — Telephone Encounter
Pharmacy stated they need clarification on patients lithium.                          Prescription sent stated to take with food, no directions.

## 2020-11-12 DIAGNOSIS — F250 Schizoaffective disorder, bipolar type: Secondary | ICD-10-CM

## 2020-11-12 DIAGNOSIS — Z79899 Other long term (current) drug therapy: Secondary | ICD-10-CM

## 2020-12-14 ENCOUNTER — Encounter: Admit: 2020-12-14 | Discharge: 2020-12-14 | Payer: MEDICARE

## 2020-12-15 ENCOUNTER — Encounter: Admit: 2020-12-15 | Discharge: 2020-12-15 | Payer: MEDICARE

## 2020-12-15 DIAGNOSIS — F250 Schizoaffective disorder, bipolar type: Secondary | ICD-10-CM

## 2020-12-15 MED ORDER — CLONAZEPAM 0.5 MG PO TAB
.5 mg | ORAL_TABLET | Freq: Every morning | ORAL | 2 refills
Start: 2020-12-15 — End: ?

## 2020-12-15 MED ORDER — PROPRANOLOL 10 MG PO TAB
10 mg | ORAL_TABLET | Freq: Two times a day (BID) | ORAL | 2 refills
Start: 2020-12-15 — End: ?

## 2020-12-15 MED ORDER — TEMAZEPAM 15 MG PO CAP
ORAL_CAPSULE | Freq: Every evening | 2 refills
Start: 2020-12-15 — End: ?

## 2020-12-21 ENCOUNTER — Encounter: Admit: 2020-12-21 | Discharge: 2020-12-21 | Payer: MEDICARE

## 2020-12-21 NOTE — Telephone Encounter
Pharmacy calling to request refill of Flomax. Spoke with pharmacy. Denied refill due to patient needing follow up appointment with [facility] APRN. Pharmacy states they will reach out to patient.

## 2021-01-04 ENCOUNTER — Encounter: Admit: 2021-01-04 | Discharge: 2021-01-04 | Payer: MEDICARE

## 2021-01-04 DIAGNOSIS — F250 Schizoaffective disorder, bipolar type: Secondary | ICD-10-CM

## 2021-01-04 DIAGNOSIS — K5904 Chronic idiopathic constipation: Secondary | ICD-10-CM

## 2021-01-04 MED ORDER — LUBIPROSTONE 24 MCG PO CAP
ORAL_CAPSULE | Freq: Two times a day (BID) | ORAL | 5 refills | 90.00000 days | Status: AC
Start: 2021-01-04 — End: ?

## 2021-01-04 MED ORDER — OLANZAPINE 5 MG PO TAB
5 mg | ORAL_TABLET | Freq: Every morning | ORAL | 1 refills | 30.00000 days | Status: AC
Start: 2021-01-04 — End: ?

## 2021-01-05 NOTE — Progress Notes
Date of Service: 01/06/2021     Subjective:             Chad Randall. is a 55 y.o. male    History of Present Illness    Chad Randall.?is a 54?y.o.?Male,?with past medical history including schizophrenia, bipolar disorder, autism, ADHD, constipation, small bowel obstruction. He has previously seen Dr. Oswaldo Milian in 2018 for evaluation of LUTS.?He?returns?to clinic for evaluation of difficult with urination.??He presents with his mother, who provides all the history during the appointment.  He underwent renal bladder ultrasound and BMP prior to her appointment.    His mother reports that he is continue to have significant difficulties with urination while taking Flomax twice daily.  She reports continues to spend significant amount of time in the bathroom secondary to urination and chronic constipation.  She reports that his urinary frequency is worse in the morning and improves slightly during the day.  She endorses he continues to have significant straining/pushing to void.  She reports that he often has significant pain that he describes as private/kidney pain.  She reports recently has had an increase in pain as well as some fatigue/drowsiness and is concerned for potential urinary tract infection.  She denies him having any leakage, he continues to void overnight to ensure he is not having nocturnal enuresis.    Patient's mom does report he continues to have significant constipation intermittently.  She is continue to work with additional providers to attempt to improve his chronic constipation.    Patient underwent blood work on 01/03/2021 that revealed a GFR at 61.2 and creatinine of 1.29 at outside facility.  ?  PVR: 312 mL    Medical History:   Diagnosis Date   ? Adverse effect     neuroleptic drugs   ? Autism    ? Constipation 10/28/2010   ? Dystonia    ? H. pylori infection    ? Headache(784.0) 08/26/2006   ? Heartburn symptom    ? History of fall     usually associated with urination or getting to feet   ? Hypothyroidism    ? Long term use of drug     antipsychotic drugs   ? Mental retardation    ? Rash     raised red rash, itches on belly, trunk, and underarms   ? SBO (small bowel obstruction) (HCC)    ? Syncope and collapse 08/26/2006   ? WBC decreased        Surgical History:   Procedure Laterality Date   ? ESOPHAGOGASTRODUODENOSCOPY ENDOSCOPIC ULTRASOUND & colonoscopy at [facility] only regarding weight loss N/A 06/17/2017    Performed by Bernita Buffy, MD at [facility] ENDO   ? COLONOSCOPY & EGD/EUS N/A 06/17/2017    Performed by Bernita Buffy, MD at [facility] ENDO   ? ESOPHAGOGASTRODUODENOSCOPY BIOPSY  06/17/2017    Performed by Bernita Buffy, MD at [facility] ENDO   ? COLONOSCOPY BIOPSY  06/17/2017    Performed by Bernita Buffy, MD at [facility] ENDO   ? BREATH HYDROGEN/ METHANE TESTING w/ glucose N/A 12/24/2017    Performed by Tim Lair, MD at [facility] ENDO   ? HX BUNIONECTOMY     ? HX TONSIL AND ADENOIDECTOMY     ? HX VASECTOMY         Family History   Problem Relation Age of Onset   ? Other Father    ? Bipolar Disorder Father    ? Hypertension Father    ? Heart  Attack Father    ? High Cholesterol Father    ? Stroke Father    ? Depression Father    ? Other Paternal Grandfather    ? Heart Attack Paternal Grandfather    ? Hypertension Paternal Grandfather    ? Stroke Paternal Grandfather    ? Depression Paternal Grandfather    ? Colon Polyps Mother    ? High Cholesterol Mother    ? Migraines Mother    ? Heart Attack Maternal Grandfather    ? Stroke Maternal Grandfather    ? Heart Attack Paternal Grandmother    ? Cancer-Colon Neg Hx        Current Outpatient Medications   Medication Sig Dispense Refill   ? aspirin EC 81 mg tablet Take 81 mg by mouth daily. Take with food.     ? atorvastatin (LIPITOR) 20 mg tablet Take 20 mg by mouth at bedtime daily.     ? bisacodyl (DULCOLAX) 5 mg tablet Take 1 Tab by mouth twice daily. 30 Tab 0   ? clonazePAM (KLONOPIN) 0.5 mg tablet TAKE ONE TABLET BY MOUTH EVERY MORNING 30 tablet 2   ? clonazePAM (KLONOPIN) 1 mg tablet TAKE ONE TABLET BY MOUTH EVERY DAY 30 tablet 5   ? CREON 24,000-76,000 -120,000 unit capsule TAKE 3 CAPSULES BY MOUTH THREE TIMES DAILY WITH MEALS 270 capsule 5   ? DOCOSAHEXANOIC ACID/EPA (FISH OIL PO) Take 2 Caps by mouth daily.     ? docusate (COLACE) 100 mg capsule Take 100 mg by mouth twice daily.     ? glucosamine(+) 500 mg tab Take 500 mg by mouth daily.     ? lamoTRIgine (LAMICTAL) 150 mg tablet Take one tablet by mouth twice daily. 180 tablet 1   ? levothyroxine (SYNTHROID) 100 mcg tablet Take 100 mcg by mouth daily.     ? lithium carbonate (ESKALITH) 150 mg capsule Take with food. Take 2 capsules in the morning and 3 capsules at bed time 150 capsule 3   ? lubiprostone (AMITIZA) 24 mcg capsule TAKE ONE CAPSULE BY MOUTH TWICE DAILY WITH MEALS 60 capsule 5   ? magnesium citrate oral solution Take 296 mL by mouth as Needed.     ? magnesium 400 mEq twice daily.     ? OLANZapine (ZYPREXA ZYDIS) 5 mg rapid dissolve tablet Dissolve one tablet by mouth daily as needed. 30 tablet 3   ? OLANZapine (ZYPREXA) 20 mg tablet Take one tablet by mouth at bedtime daily. 30 tablet 1   ? OLANZapine (ZYPREXA) 5 mg tablet TAKE ONE TABLET BY MOUTH EVERY MORNING 30 tablet 1   ? polyethylene glycol 3350 (GLYCOLAX; MIRALAX) 17 gram/dose powder Take 17 g by mouth twice daily.     ? propranoloL (INDERAL) 10 mg tablet TAKE ONE TABLET BY MOUTH TWICE DAILY 60 tablet 2   ? QUEtiapine (SEROQUEL) 100 mg tablet TAKE ONE TABLET BY MOUTH TWICE DAILY (morning AND 2pm) along with 300mg  tab FOR daily DOSE of 400mg  180 tablet 4   ? QUEtiapine (SEROQUEL) 300 mg tablet TAKE ONE TABLET BY MOUTH TWICE DAILY (morning AND 2pm along with 100mg  tab) AND TWO tabs EVERY NIGHT AT BEDTIME 360 tablet 4   ? senna (SENOKOT) 8.6 mg tablet Take 2 Tabs by mouth twice daily.     ? tamsulosin (FLOMAX) 0.4 mg capsule Take one capsule by mouth twice daily. Take 30 min after same meal.  Do not cut/ crush/ chew. 60 capsule 11   ? temazepam (  RESTORIL) 15 mg capsule TAKE ONE CAPSULE BY MOUTH EVERY NIGHT AT BEDTIME 30 capsule 2   ? temazepam (RESTORIL) 30 mg capsule TAKE ONE CAPSULE BY MOUTH AT BEDTIME AS NEEDED 30 capsule 5     No current facility-administered medications for this visit.       Allergies   Allergen Reactions   ? Diphenhydramine-Zinc Acetate AGITATION     Patient becomes agiitated and combative   ? Haloperidol Lactate AGITATION     Severe agitation per mother   ? Albuterol SEE COMMENTS     Extreme hyperactivity-per mom   ? Amitriptyline SEE COMMENTS     Uncontrollable muscle movements per mother   ? Antihistamine-1 AGITATION     Patient becomes agiitated and combative   ? Atomoxetine UNKNOWN   ? Bee Sting [Venom-Honey Bee] UNKNOWN   ? Chlorpheniram-Dm-Acetaminophen AGITATION     Patient becomes agiitated and combative   ? Clemastine UNKNOWN   ? Clozaril [Clozapine] AGITATION     Extreme anger/agitation   ? Depakote [Divalproex] SEE COMMENTS     Per mother worked well to control behavior, but caused liver tests to become abnormal so was stopped.     ? Erythromycin RASH     Allergy recorded in SMS: E-MYCIN~Reactions: RASH   ? Flurazepam UNKNOWN   ? Melatonin AGITATION   ? Phenylpropanolamine UNKNOWN   ? Pseudoephedrine UNKNOWN   ? Tripelennamine UNKNOWN   ? Triprolidine UNKNOWN   ? Zolpidem AGITATION       Social History     Socioeconomic History   ? Marital status: Single   Occupational History     Employer: [employer]   Tobacco Use   ? Smoking status: Never Smoker   ? Smokeless tobacco: Never Used   Substance and Sexual Activity   ? Alcohol use: No   ? Drug use: No         Review of Systems   Constitutional: Negative for activity change, appetite change, chills, diaphoresis, fatigue, fever and unexpected weight change.   HENT: Negative for congestion, hearing loss, mouth sores and sinus pressure.    Eyes: Negative for visual disturbance.   Respiratory: Negative for apnea, cough, chest tightness and shortness of breath.    Cardiovascular: Negative for chest pain, palpitations and leg swelling.   Gastrointestinal: Negative for abdominal pain, blood in stool, constipation, diarrhea, nausea, rectal pain and vomiting.   Genitourinary: Negative for decreased urine volume, difficulty urinating, dysuria, enuresis, flank pain, frequency, genital sores, hematuria, penile discharge, penile pain, penile swelling, scrotal swelling, testicular pain and urgency.   Musculoskeletal: Negative for arthralgias, back pain, gait problem and myalgias.   Skin: Negative for rash and wound.   Neurological: Negative for dizziness, tremors, seizures, syncope, weakness, light-headedness, numbness and headaches.   Hematological: Negative for adenopathy. Does not bruise/bleed easily.   Psychiatric/Behavioral: Negative for decreased concentration and dysphoric mood. The patient is not nervous/anxious.        Objective:         ? aspirin EC 81 mg tablet Take 81 mg by mouth daily. Take with food.   ? atorvastatin (LIPITOR) 20 mg tablet Take 20 mg by mouth at bedtime daily.   ? bisacodyl (DULCOLAX) 5 mg tablet Take 1 Tab by mouth twice daily.   ? clonazePAM (KLONOPIN) 0.5 mg tablet TAKE ONE TABLET BY MOUTH EVERY MORNING   ? clonazePAM (KLONOPIN) 1 mg tablet TAKE ONE TABLET BY MOUTH EVERY DAY   ? CREON 24,000-76,000 -120,000 unit  capsule TAKE 3 CAPSULES BY MOUTH THREE TIMES DAILY WITH MEALS   ? DOCOSAHEXANOIC ACID/EPA (FISH OIL PO) Take 2 Caps by mouth daily.   ? docusate (COLACE) 100 mg capsule Take 100 mg by mouth twice daily.   ? glucosamine(+) 500 mg tab Take 500 mg by mouth daily.   ? lamoTRIgine (LAMICTAL) 150 mg tablet Take one tablet by mouth twice daily.   ? levothyroxine (SYNTHROID) 100 mcg tablet Take 100 mcg by mouth daily.   ? lithium carbonate (ESKALITH) 150 mg capsule Take with food. Take 2 capsules in the morning and 3 capsules at bed time   ? lubiprostone (AMITIZA) 24 mcg capsule TAKE ONE CAPSULE BY MOUTH TWICE DAILY WITH MEALS   ? magnesium citrate oral solution Take 296 mL by mouth as Needed.   ? magnesium 400 mEq twice daily.   ? OLANZapine (ZYPREXA ZYDIS) 5 mg rapid dissolve tablet Dissolve one tablet by mouth daily as needed.   ? OLANZapine (ZYPREXA) 20 mg tablet Take one tablet by mouth at bedtime daily.   ? OLANZapine (ZYPREXA) 5 mg tablet TAKE ONE TABLET BY MOUTH EVERY MORNING   ? polyethylene glycol 3350 (GLYCOLAX; MIRALAX) 17 gram/dose powder Take 17 g by mouth twice daily.   ? propranoloL (INDERAL) 10 mg tablet TAKE ONE TABLET BY MOUTH TWICE DAILY   ? QUEtiapine (SEROQUEL) 100 mg tablet TAKE ONE TABLET BY MOUTH TWICE DAILY (morning AND 2pm) along with 300mg  tab FOR daily DOSE of 400mg    ? QUEtiapine (SEROQUEL) 300 mg tablet TAKE ONE TABLET BY MOUTH TWICE DAILY (morning AND 2pm along with 100mg  tab) AND TWO tabs EVERY NIGHT AT BEDTIME   ? senna (SENOKOT) 8.6 mg tablet Take 2 Tabs by mouth twice daily.   ? tamsulosin (FLOMAX) 0.4 mg capsule Take one capsule by mouth twice daily. Take 30 min after same meal.  Do not cut/ crush/ chew.   ? temazepam (RESTORIL) 15 mg capsule TAKE ONE CAPSULE BY MOUTH EVERY NIGHT AT BEDTIME   ? temazepam (RESTORIL) 30 mg capsule TAKE ONE CAPSULE BY MOUTH AT BEDTIME AS NEEDED     Vitals:    01/06/21 0858   BP: 115/67   BP Source: Arm, Left Upper   Pulse: 117     There is no height or weight on file to calculate BMI.       Physical Exam  Constitutional:       General: He is not in acute distress.     Appearance: He is well-developed. He is not diaphoretic.   HENT:      Head: Normocephalic and atraumatic.   Eyes:      General:         Right eye: No discharge.         Left eye: No discharge.      Conjunctiva/sclera: Conjunctivae normal.   Cardiovascular:      Rate and Rhythm: Normal rate.   Pulmonary:      Effort: Pulmonary effort is normal. No respiratory distress.   Musculoskeletal:         General: No deformity.      Cervical back: Normal range of motion.   Skin:     General: Skin is warm and dry. Neurological:      Mental Status: He is alert and oriented to person, place, and time.   Psychiatric:         Behavior: Behavior normal.         Thought Content: Thought content normal.  Judgment: Judgment normal.       RBUS (01/06/21)    IMPRESSION    1.  Normal size kidneys without hydronephrosis.  2.  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  3.  Moderate bladder post void residual.       Assessment and Plan:    Problem   Lower Urinary Tract Symptoms (Luts)    Consult 05/27/17  #1 complaint - increased LUTS x 1 year  Chronic dysuria, Frequency every 30 minutes, urge and urge incontinence, occasionally wearing Depends 3-4 day, nocturia x 2  No trial of OAB medications    05/27/17   PVR 180 MLs, PVR #2 3cc       Lower urinary tract symptoms (LUTS)  55 year old male returns to clinic with his mother for ongoing evaluation of incomplete bladder emptying, urinary frequency. ?Patient has been taking Flomax 0.4 mg BID daily following his last appointment and his mother notes no improvement with urination. ?His postvoid residual was significantly elevated at 312 compared to previous result at 588 mL.     Discussed in additional detail ongoing options for patient's underlying urinary retension.  Discussed consideration for management with intermittent catheterization or indwelling catheter, suprapubic catheter placement.  Discussed additional consideration for urodynamic study, which patient's mother reports would be extremely difficult with patient based upon his past and current medical history.  Discussed potential consultation with Dr. Penelope Coop for consideration of surgical interventions with or without urodynamic study.    Reviewed renal bladder ultrasound impression showing normal-sized kidneys without hydronephrosis with continued showing a moderate postvoid residual.  Reviewed patient provided basic metabolic panel showing GFR resulting at 61.2 and creatinine resulted at 1.29, documents were placed within outside scan box for upload.    Patient's mother like to continue to monitor symptoms at this time as his symptoms have not worsened and have remained stable due to findings of preserved kidney function without hydronephrosis.  We will plan to follow-up in 6 months for further evaluation with postvoid residual, instructed patient's mother to call if he developed worsening urinary symptoms, episode of acute urinary retention.  All question concerns answered.    Orders Placed This Encounter   ? CULTURE-URINE W/SENSITIVITY Chief Financial Officer)   ? tamsulosin (FLOMAX) 0.4 mg capsule     Return in about 6 months (around 07/08/2021) for Retention, PVR.      Abby Potash, APRN-NP  Department of Urology

## 2021-01-06 ENCOUNTER — Ambulatory Visit: Admit: 2021-01-06 | Discharge: 2021-01-06 | Payer: MEDICARE

## 2021-01-06 ENCOUNTER — Encounter: Admit: 2021-01-06 | Discharge: 2021-01-06 | Payer: MEDICARE

## 2021-01-06 DIAGNOSIS — Z9181 History of falling: Secondary | ICD-10-CM

## 2021-01-06 DIAGNOSIS — R399 Unspecified symptoms and signs involving the genitourinary system: Secondary | ICD-10-CM

## 2021-01-06 DIAGNOSIS — R12 Heartburn: Secondary | ICD-10-CM

## 2021-01-06 DIAGNOSIS — F840 Autistic disorder: Secondary | ICD-10-CM

## 2021-01-06 DIAGNOSIS — R51 Headache: Secondary | ICD-10-CM

## 2021-01-06 DIAGNOSIS — A048 Other specified bacterial intestinal infections: Secondary | ICD-10-CM

## 2021-01-06 DIAGNOSIS — F79 Unspecified intellectual disabilities: Secondary | ICD-10-CM

## 2021-01-06 DIAGNOSIS — Z79899 Other long term (current) drug therapy: Secondary | ICD-10-CM

## 2021-01-06 DIAGNOSIS — E039 Hypothyroidism, unspecified: Secondary | ICD-10-CM

## 2021-01-06 DIAGNOSIS — K5900 Constipation, unspecified: Secondary | ICD-10-CM

## 2021-01-06 DIAGNOSIS — K56609 Unspecified intestinal obstruction, unspecified as to partial versus complete obstruction: Secondary | ICD-10-CM

## 2021-01-06 DIAGNOSIS — R55 Syncope and collapse: Secondary | ICD-10-CM

## 2021-01-06 DIAGNOSIS — R21 Rash and other nonspecific skin eruption: Secondary | ICD-10-CM

## 2021-01-06 DIAGNOSIS — R339 Retention of urine, unspecified: Secondary | ICD-10-CM

## 2021-01-06 DIAGNOSIS — G249 Dystonia, unspecified: Secondary | ICD-10-CM

## 2021-01-06 DIAGNOSIS — D72819 Decreased white blood cell count, unspecified: Secondary | ICD-10-CM

## 2021-01-06 MED ORDER — TAMSULOSIN 0.4 MG PO CAP
0.4 mg | ORAL_CAPSULE | Freq: Two times a day (BID) | ORAL | 11 refills | 90.00000 days | Status: AC
Start: 2021-01-06 — End: ?

## 2021-02-02 ENCOUNTER — Encounter: Admit: 2021-02-02 | Discharge: 2021-02-02 | Payer: MEDICARE

## 2021-02-02 DIAGNOSIS — F250 Schizoaffective disorder, bipolar type: Secondary | ICD-10-CM

## 2021-02-02 MED ORDER — CLONAZEPAM 1 MG PO TAB
ORAL_TABLET | Freq: Every day | 5 refills
Start: 2021-02-02 — End: ?

## 2021-02-02 MED ORDER — OLANZAPINE 20 MG PO TAB
ORAL_TABLET | Freq: Every evening | ORAL | 1 refills | 30.00000 days | Status: AC
Start: 2021-02-02 — End: ?

## 2021-02-02 MED ORDER — TEMAZEPAM 30 MG PO CAP
30 mg | ORAL_CAPSULE | Freq: Every evening | ORAL | 5 refills | PRN
Start: 2021-02-02 — End: ?

## 2021-02-24 ENCOUNTER — Encounter: Admit: 2021-02-24 | Discharge: 2021-02-24 | Payer: MEDICARE

## 2021-02-24 DIAGNOSIS — K649 Unspecified hemorrhoids: Secondary | ICD-10-CM

## 2021-02-24 MED ORDER — HYDROCORTISONE-PRAMOXINE 1-1 % RE CREA
1 g | Freq: Two times a day (BID) | RECTAL | 1 refills | 30.00000 days | Status: AC
Start: 2021-02-24 — End: ?

## 2021-02-24 NOTE — Telephone Encounter
Mother has concerns of "large" hemorrhoid stating that PCP does not want to address asking for our assistance. Noted internal/external hemorrhoid on 06/27/2017 last colonoscopy.  Patient has known constipation and pelvic floor dysfunction Volney American, NP 10/2019 office visit note.  Patient has [employer].

## 2021-03-05 ENCOUNTER — Encounter: Admit: 2021-03-05 | Discharge: 2021-03-05 | Payer: MEDICARE

## 2021-03-05 DIAGNOSIS — F250 Schizoaffective disorder, bipolar type: Secondary | ICD-10-CM

## 2021-03-05 MED ORDER — OLANZAPINE 5 MG PO TAB
5 mg | ORAL_TABLET | Freq: Every morning | ORAL | 1 refills
Start: 2021-03-05 — End: ?

## 2021-03-05 MED ORDER — TEMAZEPAM 15 MG PO CAP
ORAL_CAPSULE | Freq: Every evening | 2 refills
Start: 2021-03-05 — End: ?

## 2021-03-05 MED ORDER — PROPRANOLOL 10 MG PO TAB
10 mg | ORAL_TABLET | Freq: Two times a day (BID) | ORAL | 2 refills
Start: 2021-03-05 — End: ?

## 2021-03-05 MED ORDER — CREON 24,000-76,000 -120,000 UNIT PO CPDR
ORAL_CAPSULE | Freq: Three times a day (TID) | 5 refills
Start: 2021-03-05 — End: ?

## 2021-03-05 MED ORDER — CLONAZEPAM 0.5 MG PO TAB
.5 mg | ORAL_TABLET | Freq: Every morning | ORAL | 2 refills
Start: 2021-03-05 — End: ?

## 2021-03-05 MED ORDER — LITHIUM CARBONATE 150 MG PO CAP
ORAL_CAPSULE | 3 refills
Start: 2021-03-05 — End: ?

## 2021-03-09 ENCOUNTER — Encounter: Admit: 2021-03-09 | Discharge: 2021-03-09 | Payer: MEDICARE

## 2021-03-13 ENCOUNTER — Encounter: Admit: 2021-03-13 | Discharge: 2021-03-13 | Payer: MEDICARE

## 2021-03-13 NOTE — Telephone Encounter
PA was received through [employer] Fax and completed through [email or web address]  Key Code: [id number]   Medication: Hydrocortisone Ace-Pramoxine 1-1% cream  PA can take 24-72 hours for determination. If you have any question contact the number on the back of insurance card.

## 2021-03-20 ENCOUNTER — Encounter: Admit: 2021-03-20 | Discharge: 2021-03-20 | Payer: MEDICARE

## 2021-03-20 NOTE — Telephone Encounter
New Patient Appointment with Colorectal Surgery (CRS)  Date: 03/22/21   Time: 1:00 pm  CRS Provider Name (full name): [facility] Location [facility] w/address): [facility]: [address] [place], [place]    Verbal directions to appointment given on (date):  Given to Mother, Kathie Rhodes, 03/20/21  Patient has MyChart Access (yes, no w/reason or date instructions sent to pt):   yes    Other Appt/Tests Coordinated with this Appt (n/a or test name w/date & time): n/a    Need Hoyer Lift (yes or n/a. For disabled patients.): n/a    Engineer, technical sales (n/a or Language & ID #): n/a    Patient expressed understanding of below:   -Check-in 30 min early (yes/no):  yes  -Wait List (yes, n/a or pt declined): n/a pt being scheduled 03/22/21 on 03/20/21  -No-Show/Late Cancellation Policy (yes, or no w/reason): yes  The surgeons ask that we let all patients know of the NS/LC Policy here at [facility] for all appointments: If you need to cancel/reschedule, please try and let us know as far in advance as possible. If you cancel after 12PM the day before the appointment, it is considered a NS/LC. When you accumulate NS/LC, it gives the provider the option to review and possibly decline future appointments.   -Insurance Referral Authorization (Tricare, [place] CCN, [employer])/Self-Pay (n/a, Self-Pay or Auth # & date range): n/a    CRS Provider Preference (any or name of provider(s)): any    Referring Provider (name & specialty): Tim Lair (GI)  Care Team Updated on (date): 8.22.22    Diagnosis:    hemorrhoids (mother states pt has constipation also)      Verbal records history from (pt or name of family member, caregiver, etc.): Mother    Continuation of Care    GI Lab Testing/Procedure Reports with any Associated Pathology (FROM LAST 10 YEARS):   Colonoscopy, EGD, Flexible Sigmoidoscopy, Anorectal Manometry, Capsule Endoscopy, Endoscopic Ultrasound   Patient GI Testing History (Approx year, name of test & facility name, or provider name as needed. Or document, Pt with no memory of provider/facility name.):     Colonoscopy approx. 2 years ago at [facility]  EGD 06/18/27 [place]      Operative Report, Surgical Pathology & Discharge Summary (FROM LAST 10 YEARS):   Abdominal & Pelvic Surgeries, Colon, Rectal & Anal Surgeries (Please also note lifetime history.)  Patient Abdominal/Pelvic Surgery History (Approx year, non-clinical name of surgery & facility name, or provider name as needed. Or document, Pt with no memory of provider/facility name.):     None in last 10 years. Had vasectomy more than 10 years ago.  At [facility].    Radiology Imaging Reports & Cloud Images (FROM LAST 5 YEARS):    CT c/a/p, MRI a/p, MRE, Xray: Abdomen (KUB), Pelvis, Acute Abdominal Series, Small Bowel Follow Thru, PET Scan: Full Body or Skull to Thighs. Colon Single Contrast &/or MRI Defecography, Barium/Gastrographin Enemas, Sitz Marker Study  Patient Radiology History (List name of Hospital(s)/Facilities(s) only):     CT at [facility] and [place].  Mother does not remember dates.    Pelvic Floor Physical Therapy Progress Notes or Biofeedback Testing/Pelvic EMG Testing (FROM LAST 5 YEARS).  Patient Physical Therapy History (n/a or list timeframe w/facility name. Common diagnoses w/PT: constipation, fecal incontinence, pelvic floor dysfunction, rectocele & rectal prolapse):     No    Gastroenterology Office Visit Notes/Progress Notes (FROM LAST 5 YEARS).  Patient Office Visit Notes/Progress Notes  History: CNC/RN to review and request as needed.     Emailed Records/Referral from Physician Consult to [email or web address] (scan into O2) and corresponding CNC/RN (n/a or name of RN name w/date): n/a

## 2021-03-20 NOTE — Progress Notes
..  This prechart is intended to be a reference for patient appointments. Information is gathered from in chart as well as external records review. Information will be clarified/verified/updated in final documentation in the office visit.     Pre Clinic Pre Chart:    Problem List:  Hemorrhoid  Schizoaffective disorder  Bipolar  Autism    02/24/2021 - GI:  Mother has concerns of large hemorrhoid stating that PCP does not want to address asking for our assistance. Noted internal/external hemorrhoid on 06/27/2017 last colonoscopy.  Patient has known constipation and pelvic floor dysfunction Volney American, NP 10/2019 office visit note.  Patient has [employer].      06/17/2017 - Colonoscopy:  The Colonoscope 8200   ? ? ?was introduced through the anus and advanced to the terminal ileum, with   ? ? ?identification of the appendiceal orifice and IC valve. The colonoscopy   ? ? ?was extremely difficult due to significant looping and a tortuous colon.   ? ? ?Successful completion of the procedure was aided by applying abdominal   ? ? ?pressure. The patient tolerated the procedure well. The terminal ileum,   ? ? ?ileocecal valve, appendiceal orifice, and rectum were photographed. The   ? ? ?quality of the bowel preparation was fair.   Findings:   ? ? ?The perianal and digital rectal examinations were normal.   ? ? ?The terminal ileum appeared normal.   ? ? ?A small amount of stool was found in the ascending colon, interfering   ? ? ?with visualization. Lavage of the area was performed using a moderate   ? ? ?amount of sterile water, resulting in incomplete clearance with fair   ? ? ?visualization.   ? ? ?The colon (entire examined portion) appeared normal. Biopsies for   ? ? ?histology were taken with a cold forceps from the entire colon for   ? ? ?evaluation of microscopic colitis. Estimated blood loss was minimal.   ? ? ?Non-bleeding external and internal hemorrhoids were found during   ? ? ?retroflexion. The hemorrhoids were medium-sized.   Impression: ? ? ? ? ? ? ? ? ? - Preparation of the colon was fair.   ? ? ? ? ? ? ? ? ? ? ? ? ? ? ? - The examined portion of the ileum was normal.   ? ? ? ? ? ? ? ? ? ? ? ? ? ? ? - Stool in the ascending colon.   ? ? ? ? ? ? ? ? ? ? ? ? ? ? ? - The entire examined colon is normal. Biopsied.   ? ? ? ? ? ? ? ? ? ? ? ? ? ? ? - Non-bleeding external and internal   ? ? ? ? ? ? ? ? ? ? ? ? ? ? ? hemorrhoids.   Pathology:  C. Colonic mucosa, random biopsy , biopsy: ?   No diagnostic abnormalities. ?

## 2021-03-22 ENCOUNTER — Encounter: Admit: 2021-03-22 | Discharge: 2021-03-22 | Payer: MEDICARE

## 2021-03-22 DIAGNOSIS — A048 Other specified bacterial intestinal infections: Secondary | ICD-10-CM

## 2021-03-22 DIAGNOSIS — R12 Heartburn: Secondary | ICD-10-CM

## 2021-03-22 DIAGNOSIS — D72819 Decreased white blood cell count, unspecified: Secondary | ICD-10-CM

## 2021-03-22 DIAGNOSIS — R51 Headache: Secondary | ICD-10-CM

## 2021-03-22 DIAGNOSIS — K56609 Unspecified intestinal obstruction, unspecified as to partial versus complete obstruction: Secondary | ICD-10-CM

## 2021-03-22 DIAGNOSIS — F840 Autistic disorder: Secondary | ICD-10-CM

## 2021-03-22 DIAGNOSIS — Z9181 History of falling: Secondary | ICD-10-CM

## 2021-03-22 DIAGNOSIS — G249 Dystonia, unspecified: Secondary | ICD-10-CM

## 2021-03-22 DIAGNOSIS — R21 Rash and other nonspecific skin eruption: Secondary | ICD-10-CM

## 2021-03-22 DIAGNOSIS — K645 Perianal venous thrombosis: Secondary | ICD-10-CM

## 2021-03-22 DIAGNOSIS — E039 Hypothyroidism, unspecified: Secondary | ICD-10-CM

## 2021-03-22 DIAGNOSIS — Z79899 Other long term (current) drug therapy: Secondary | ICD-10-CM

## 2021-03-22 DIAGNOSIS — K5900 Constipation, unspecified: Secondary | ICD-10-CM

## 2021-03-22 DIAGNOSIS — R55 Syncope and collapse: Secondary | ICD-10-CM

## 2021-03-22 DIAGNOSIS — F79 Unspecified intellectual disabilities: Secondary | ICD-10-CM

## 2021-03-22 MED ORDER — HYDROCORTISONE 2.5 % TP CREA
Freq: Two times a day (BID) | TOPICAL | 0 refills | 30.00000 days | Status: AC
Start: 2021-03-22 — End: ?

## 2021-03-22 NOTE — Progress Notes
Date of Service: 03/22/2021    Subjective:             Chad Randall. is a 55 y.o. male.    History of Present Illness  Chad Randall is a 54 year old male with a history of schizoaffective bipolar type disorder, autism spectrum, ADHD, constipation, and muscular atrophy who presents today for evaluation of anal pain and bleeding.  Approximately a month ago he had anal pain and there was a large knot on the outside.  His mom saw this and there was a bleeding associated with it.  She helps him bathe and when he gets out at the tub he sits on a towel on the toilet.  Every time he would get up there would be blood on the toilet.  The pain was significant to where he would not sit straight on his bottom to eat and he would offset the pressure.  They were prescribed hydrocortisone 1% and they have been using this. It is helping. He has had hemorrhoids in the past but this 1 would not go back on the inside.  She felt that it was outside of the sphincter complex.  This also seem to be more painful to him than prior.    From a constipation standpoint he has had longstanding constipation.  On average has 2 very large bowel movements per week.  His mom described a stool once as the size of a Pringle can.  Currently takes Amitiza, senna, and MiraLAX.  Has also previously been on Linzess.  He is followed with gastroenterology and has been diagnosed with pelvic floor dysfunction as well.  He unfortunately would not tolerate pelvic floor physical therapy for retraining.  There was some discussion of colonic inertia but work-up was not pursued as they did not feel the patient would do well with an ostomy.  His gastroenterologist has left and he does not have a follow-up with their department.  His last colonoscopy was in 2018.  The overall prep quality was fair.  There were no polyps found.    02/24/2021 - GI:  Mother has concerns of large hemorrhoid stating that PCP does not want to address asking for our assistance. Noted internal/external hemorrhoid on 06/27/2017 last colonoscopy.  Patient has known constipation and pelvic floor dysfunction Volney American, NP 10/2019 office visit note.  Patient has [employer].      06/17/2017 - Colonoscopy:  The Colonoscope 8200   ? ? ?was introduced through the anus and advanced to the terminal ileum, with   ? ? ?identification of the appendiceal orifice and IC valve. The colonoscopy   ? ? ?was extremely difficult due to significant looping and a tortuous colon.   ? ? ?Successful completion of the procedure was aided by applying abdominal   ? ? ?pressure. The patient tolerated the procedure well. The terminal ileum,   ? ? ?ileocecal valve, appendiceal orifice, and rectum were photographed. The   ? ? ?quality of the bowel preparation was fair.   Findings:   ? ? ?The perianal and digital rectal examinations were normal.   ? ? ?The terminal ileum appeared normal.   ? ? ?A small amount of stool was found in the ascending colon, interfering   ? ? ?with visualization. Lavage of the area was performed using a moderate   ? ? ?amount of sterile water, resulting in incomplete clearance with fair   ? ? ?visualization.   ? ? ?The colon (entire examined portion) appeared normal. Biopsies for   ? ? ?  histology were taken with a cold forceps from the entire colon for   ? ? ?evaluation of microscopic colitis. Estimated blood loss was minimal.   ? ? ?Non-bleeding external and internal hemorrhoids were found during   ? ? ?retroflexion. The hemorrhoids were medium-sized.   Impression: ? ? ? ? ? ? ? ? ? - Preparation of the colon was fair.   ? ? ? ? ? ? ? ? ? ? ? ? ? ? ? - The examined portion of the ileum was normal.   ? ? ? ? ? ? ? ? ? ? ? ? ? ? ? - Stool in the ascending colon.   ? ? ? ? ? ? ? ? ? ? ? ? ? ? ? - The entire examined colon is normal. Biopsied.   ? ? ? ? ? ? ? ? ? ? ? ? ? ? ? - Non-bleeding external and internal   ? ? ? ? ? ? ? ? ? ? ? ? ? ? ? hemorrhoids.   Pathology:  C. Colonic mucosa, random biopsy , biopsy: ?   No diagnostic abnormalities. ?       Medical History:   Diagnosis Date   ? Adverse effect     neuroleptic drugs   ? Autism    ? Constipation 10/28/2010   ? Dystonia    ? H. pylori infection    ? Headache(784.0) 08/26/2006   ? Heartburn symptom    ? History of fall     usually associated with urination or getting to feet   ? Hypothyroidism    ? Long term use of drug     antipsychotic drugs   ? Mental retardation    ? Rash     raised red rash, itches on belly, trunk, and underarms   ? SBO (small bowel obstruction) (HCC)    ? Syncope and collapse 08/26/2006   ? WBC decreased      Surgical History:   Procedure Laterality Date   ? ESOPHAGOGASTRODUODENOSCOPY ENDOSCOPIC ULTRASOUND & colonoscopy at [facility] only regarding weight loss N/A 06/17/2017    Performed by Bernita Buffy, MD at [facility] ENDO   ? COLONOSCOPY & EGD/EUS N/A 06/17/2017    Performed by Bernita Buffy, MD at [facility] ENDO   ? ESOPHAGOGASTRODUODENOSCOPY BIOPSY  06/17/2017    Performed by Bernita Buffy, MD at [facility] ENDO   ? COLONOSCOPY BIOPSY  06/17/2017    Performed by Bernita Buffy, MD at [facility] END[facility]   ? BREATH HYDROGEN/ METHANE TESTING w/ glucose N/A 12/24/2017    Performed by Tim Lair, MD at [facility] ENDO   ? HX BUNIONECTOMY     ? HX TONSIL AND ADENOIDECTOMY     ? HX VASECTOMY       Family History   Problem Relation Age of Onset   ? Other Father    ? Bipolar Disorder Father    ? Hypertension Father    ? Heart Attack Father    ? High Cholesterol Father    ? Stroke Father    ? Depression Father    ? Other Paternal Grandfather    ? Heart Attack Paternal Grandfather    ? Hypertension Paternal Grandfather    ? Stroke Paternal Grandfather    ? Depression Paternal Grandfather    ? Colon Polyps Mother    ? High Cholesterol Mother    ? Migraines Mother    ? Heart Attack Maternal Grandfather    ? Stroke Maternal  Grandfather    ? Heart Attack Paternal Grandmother    ? Cancer-Colon Neg Hx      Social History     Socioeconomic History   ? Marital status: Single   Occupational History     Employer: [employer]   Tobacco Use   ? Smoking status: Never Smoker   ? Smokeless tobacco: Never Used   Substance and Sexual Activity   ? Alcohol use: No   ? Drug use: No                      Review of Systems   Constitutional: Positive for activity change and fatigue. Negative for chills, fever and unexpected weight change.   HENT: Positive for postnasal drip.    Respiratory: Positive for apnea.    Gastrointestinal: Positive for abdominal distention, abdominal pain, anal bleeding, blood in stool, constipation, diarrhea, nausea and rectal pain. Negative for vomiting.   Endocrine: Positive for cold intolerance, heat intolerance, polydipsia, polyphagia and polyuria.   Genitourinary: Positive for decreased urine volume, difficulty urinating and dysuria.   Musculoskeletal: Positive for neck stiffness.   Skin: Positive for pallor. Negative for color change.   Neurological: Positive for speech difficulty. Negative for dizziness, weakness and headaches.   Psychiatric/Behavioral: Positive for behavioral problems, self-injury and sleep disturbance. Negative for agitation and confusion. The patient is not nervous/anxious.    All other systems reviewed and are negative.        Objective:         ? aspirin EC 81 mg tablet Take 81 mg by mouth daily. Take with food.   ? atorvastatin (LIPITOR) 20 mg tablet Take 20 mg by mouth at bedtime daily.   ? bisacodyl (DULCOLAX) 5 mg tablet Take 1 Tab by mouth twice daily.   ? clonazePAM (KLONOPIN) 0.5 mg tablet TAKE ONE TABLET BY MOUTH EVERY MORNING   ? clonazePAM (KLONOPIN) 1 mg tablet TAKE ONE TABLET BY MOUTH EVERY DAY   ? CREON 24,000-76,000 -120,000 unit capsule TAKE 3 CAPSULES BY MOUTH THREE TIMES DAILY WITH MEALS   ? DOCOSAHEXANOIC ACID/EPA (FISH OIL PO) Take 2 Caps by mouth daily.   ? docusate (COLACE) 100 mg capsule Take 100 mg by mouth twice daily.   ? glucosamine(+) 500 mg tab Take 500 mg by mouth daily.   ? lamoTRIgine (LAMICTAL) 150 mg tablet Take one tablet by mouth twice daily.   ? levothyroxine (SYNTHROID) 100 mcg tablet Take 100 mcg by mouth daily.   ? lithium carbonate (ESKALITH) 150 mg capsule TAKE TWO CAPSULES BY MOUTH IN THE MORNING AND THREE CAPSULES AT BEDTIME WITH FOOD   ? lubiprostone (AMITIZA) 24 mcg capsule TAKE ONE CAPSULE BY MOUTH TWICE DAILY WITH MEALS   ? magnesium citrate oral solution Take 296 mL by mouth as Needed.   ? magnesium 400 mEq twice daily.   ? OLANZapine (ZYPREXA ZYDIS) 5 mg rapid dissolve tablet Dissolve one tablet by mouth daily as needed.   ? OLANZapine (ZYPREXA) 20 mg tablet TAKE ONE TABLET BY MOUTH AT BEDTIME   ? OLANZapine (ZYPREXA) 5 mg tablet TAKE ONE TABLET BY MOUTH EVERY MORNING   ? polyethylene glycol 3350 (GLYCOLAX; MIRALAX) 17 gram/dose powder Take 17 g by mouth twice daily.   ? pramoxine-hydrocortisone (ANALPRAM-HC) 1-1 % rectal cream Insert or Apply one g to rectal area as directed twice daily.   ? propranoloL (INDERAL) 10 mg tablet TAKE ONE TABLET BY MOUTH TWICE DAILY   ? QUEtiapine (SEROQUEL)  100 mg tablet TAKE ONE TABLET BY MOUTH TWICE DAILY (morning AND 2pm) along with 300mg  tab FOR daily DOSE of 400mg    ? QUEtiapine (SEROQUEL) 300 mg tablet TAKE ONE TABLET BY MOUTH TWICE DAILY (morning AND 2pm along with 100mg  tab) AND TWO tabs EVERY NIGHT AT BEDTIME   ? senna (SENOKOT) 8.6 mg tablet Take 2 Tabs by mouth twice daily.   ? tamsulosin (FLOMAX) 0.4 mg capsule Take one capsule by mouth twice daily. Take 30 min after same meal.  Do not cut/ crush/ chew.   ? temazepam (RESTORIL) 15 mg capsule TAKE ONE CAPSULE BY MOUTH EVERY NIGHT AT BEDTIME   ? temazepam (RESTORIL) 30 mg capsule TAKE ONE CAPSULE BY MOUTH AT BEDTIME AS NEEDED     Vitals:    03/22/21 1253   BP: 111/86   BP Source: Arm, Left Upper   Pulse: 77   Temp: 36.6 ?C (97.9 ?F)   Resp: 16   SpO2: 98%   TempSrc: Temporal   Weight: 66.2 kg (146 lb)     Body mass index is 23.57 kg/m?Marland Kitchen     Physical Exam  Vitals reviewed.   Constitutional: General: He is not in acute distress.     Appearance: He is well-developed.   HENT:      Head: Normocephalic and atraumatic.      Nose: Nose normal.   Eyes:      General: Lids are normal.      Conjunctiva/sclera: Conjunctivae normal.   Pulmonary:      Effort: Pulmonary effort is normal. No respiratory distress.   Genitourinary:     Comments: External: Very small external hemorrhoids. Otherwise normal perianal skin.   DRE: No palpable masses or lesions.   Musculoskeletal:      Cervical back: Normal range of motion.   Neurological:      Mental Status: He is alert and oriented to person, place, and time.   Psychiatric:         Mood and Affect: Mood normal.              Assessment and Plan:  55yo male with constipation and prior anal bleeding. Possibly resolved thrombosed hemorrhoid.     Hemorrhoids:   - We reviewed that hemorrhoids are part of the normal anatomy of the anal canal. There are three hemorrhoid columns, left lateral, right anterior, and right posterior. Constipation, straining, or sitting on the commode for long periods of time can cause congestion of the hemorrhoids. Hemorrhoids can be classified as internal, external or mixed.   - We discussed that depending on which kind of hemorrhoids the patient is suffering from influences the type of treatment we offer. Generally speaking the options range from conservative measures, rubber band ligation, and hemorrhoidectomy. All of these options were explained in detail to the patient. Additionally, we discussed post operative recovery times for both rubber band ligation and excisional hemorrhoidectomy.   - I suspect he had a thrombosed hemorrhoid which caused pain and bleeding which has now resolved.   - Counseled on optimizing bowel habits. This is difficult and management is done by GI.   - Will start Ansuol topical cream BID x 14days if hemorrhoid pops out again and will call.   - Discussed I would be hesitant to offer excisional hemorrhoidectomy in a patient with severe constipation.     Chronic Constipation:  - Needs to continue to follow with gastroenterology for management of constipation.  - Briefly discussed work up of colonic inertia  and possible interventions. Mom doesn't feel he would do well with large surgery or ostomy bag.  Unfortunately would unlikely succeed with pelvic floor retraining as well.  This limits her interventions for his chronic constipation.  - Message sent to GI to help assist with TOC since Dr. Jomarie Longs has left.     Plan:  - RTC PRN.     .The patient and family were allowed to ask questions and voice concerns; these were addressed to the best of our ability. They expressed understanding of what was explained to them, and they agreed with the present plan. Patient has the phone numbers for the Cancer Center and was instructed on how to contact us with any questions or concerns.    My collaborating provider on this patient is Dr. Deedra Ehrich MD.    Brunilda Payor. Fredricka Bonine MSN, APRN, AGCNS-BC, AGPCNP-BC  Advanced Practice Provider  Colon and Rectal Surgery  Surgical Oncology  APP for Dr. Kathee Delton, Dr. Daphine Deutscher and Dr. Daiva Nakayama  Pager: [phone]  Available via Amie Critchley and [facility] Connect

## 2021-03-28 ENCOUNTER — Ambulatory Visit: Admit: 2021-03-28 | Discharge: 2021-03-28 | Payer: MEDICARE

## 2021-03-28 ENCOUNTER — Encounter: Admit: 2021-03-28 | Discharge: 2021-03-28 | Payer: MEDICARE

## 2021-03-28 DIAGNOSIS — R21 Rash and other nonspecific skin eruption: Secondary | ICD-10-CM

## 2021-03-28 DIAGNOSIS — E039 Hypothyroidism, unspecified: Secondary | ICD-10-CM

## 2021-03-28 DIAGNOSIS — Z8619 Personal history of other infectious and parasitic diseases: Secondary | ICD-10-CM

## 2021-03-28 DIAGNOSIS — K56609 Unspecified intestinal obstruction, unspecified as to partial versus complete obstruction: Secondary | ICD-10-CM

## 2021-03-28 DIAGNOSIS — K6389 Other specified diseases of intestine: Secondary | ICD-10-CM

## 2021-03-28 DIAGNOSIS — K5904 Chronic idiopathic constipation: Secondary | ICD-10-CM

## 2021-03-28 DIAGNOSIS — R109 Unspecified abdominal pain: Secondary | ICD-10-CM

## 2021-03-28 DIAGNOSIS — G249 Dystonia, unspecified: Secondary | ICD-10-CM

## 2021-03-28 DIAGNOSIS — Z9181 History of falling: Secondary | ICD-10-CM

## 2021-03-28 DIAGNOSIS — F840 Autistic disorder: Secondary | ICD-10-CM

## 2021-03-28 DIAGNOSIS — K5900 Constipation, unspecified: Secondary | ICD-10-CM

## 2021-03-28 DIAGNOSIS — D72819 Decreased white blood cell count, unspecified: Secondary | ICD-10-CM

## 2021-03-28 DIAGNOSIS — R51 Headache: Secondary | ICD-10-CM

## 2021-03-28 DIAGNOSIS — F79 Unspecified intellectual disabilities: Secondary | ICD-10-CM

## 2021-03-28 DIAGNOSIS — Z79899 Other long term (current) drug therapy: Secondary | ICD-10-CM

## 2021-03-28 DIAGNOSIS — R12 Heartburn: Secondary | ICD-10-CM

## 2021-03-28 DIAGNOSIS — A048 Other specified bacterial intestinal infections: Secondary | ICD-10-CM

## 2021-03-28 DIAGNOSIS — R55 Syncope and collapse: Secondary | ICD-10-CM

## 2021-03-28 LAB — TSH WITH FREE T4 REFLEX: TSH: 2.9 uU/mL (ref 0.35–5.00)

## 2021-03-28 MED ORDER — LUBIPROSTONE 24 MCG PO CAP
24 ug | ORAL_CAPSULE | Freq: Two times a day (BID) | ORAL | 3 refills | Status: CN
Start: 2021-03-28 — End: ?

## 2021-03-28 MED ORDER — TRULANCE 3 MG PO TAB
3 mg | ORAL_TABLET | Freq: Every day | ORAL | 2 refills | Status: AC
Start: 2021-03-28 — End: ?
  Filled 2021-03-29: qty 30, 30d supply, fill #1

## 2021-03-28 MED ORDER — DOXYCYCLINE HYCLATE 100 MG PO TAB
100 mg | ORAL_TABLET | Freq: Two times a day (BID) | ORAL | 0 refills | 8.00000 days | Status: AC
Start: 2021-03-28 — End: ?
  Filled 2021-03-29: qty 20, 10d supply, fill #1

## 2021-03-28 MED ORDER — CREON 24,000-76,000 -120,000 UNIT PO CPDR
ORAL_CAPSULE | Freq: Three times a day (TID) | ORAL | 3 refills | 30.00000 days | Status: AC
Start: 2021-03-28 — End: ?
  Filled 2021-03-29: qty 1350, 90d supply, fill #1

## 2021-03-28 MED ORDER — PEG-ELECTROLYTE SOLN 420 GRAM PO SOLR
12 refills | Status: AC
Start: 2021-03-28 — End: ?
  Filled 2021-03-29: qty 4, 1d supply, fill #1

## 2021-03-29 ENCOUNTER — Encounter: Admit: 2021-03-29 | Discharge: 2021-03-29 | Payer: MEDICARE

## 2021-04-03 ENCOUNTER — Encounter: Admit: 2021-04-03 | Discharge: 2021-04-03 | Payer: MEDICARE

## 2021-04-03 DIAGNOSIS — F250 Schizoaffective disorder, bipolar type: Secondary | ICD-10-CM

## 2021-04-03 MED ORDER — OLANZAPINE 20 MG PO TAB
ORAL_TABLET | Freq: Every evening | 1 refills
Start: 2021-04-03 — End: ?

## 2021-04-03 MED ORDER — LAMOTRIGINE 150 MG PO TAB
150 mg | ORAL_TABLET | Freq: Two times a day (BID) | ORAL | 1 refills
Start: 2021-04-03 — End: ?

## 2021-04-05 ENCOUNTER — Encounter: Admit: 2021-04-05 | Discharge: 2021-04-05 | Payer: MEDICARE

## 2021-04-12 ENCOUNTER — Encounter: Admit: 2021-04-12 | Discharge: 2021-04-12 | Payer: MEDICARE

## 2021-04-12 DIAGNOSIS — F250 Schizoaffective disorder, bipolar type: Secondary | ICD-10-CM

## 2021-04-12 MED ORDER — LAMOTRIGINE 150 MG PO TAB
150 mg | ORAL_TABLET | Freq: Two times a day (BID) | ORAL | 1 refills
Start: 2021-04-12 — End: ?

## 2021-05-13 ENCOUNTER — Encounter: Admit: 2021-05-13 | Discharge: 2021-05-13 | Payer: MEDICARE

## 2021-05-13 DIAGNOSIS — F250 Schizoaffective disorder, bipolar type: Secondary | ICD-10-CM

## 2021-05-13 MED ORDER — OLANZAPINE 5 MG PO TAB
5 mg | ORAL_TABLET | Freq: Every morning | ORAL | 1 refills
Start: 2021-05-13 — End: ?

## 2021-06-07 ENCOUNTER — Ambulatory Visit: Admit: 2021-06-07 | Discharge: 2021-06-07 | Payer: MEDICARE

## 2021-06-07 ENCOUNTER — Encounter: Admit: 2021-06-07 | Discharge: 2021-06-07 | Payer: MEDICARE

## 2021-06-07 DIAGNOSIS — E039 Hypothyroidism, unspecified: Secondary | ICD-10-CM

## 2021-06-07 DIAGNOSIS — A048 Other specified bacterial intestinal infections: Secondary | ICD-10-CM

## 2021-06-07 DIAGNOSIS — K56609 Unspecified intestinal obstruction, unspecified as to partial versus complete obstruction: Secondary | ICD-10-CM

## 2021-06-07 DIAGNOSIS — R51 Headache: Secondary | ICD-10-CM

## 2021-06-07 DIAGNOSIS — F250 Schizoaffective disorder, bipolar type: Principal | ICD-10-CM

## 2021-06-07 DIAGNOSIS — R55 Syncope and collapse: Secondary | ICD-10-CM

## 2021-06-07 DIAGNOSIS — R21 Rash and other nonspecific skin eruption: Secondary | ICD-10-CM

## 2021-06-07 DIAGNOSIS — F902 Attention-deficit hyperactivity disorder, combined type: Secondary | ICD-10-CM

## 2021-06-07 DIAGNOSIS — Z9181 History of falling: Secondary | ICD-10-CM

## 2021-06-07 DIAGNOSIS — G249 Dystonia, unspecified: Secondary | ICD-10-CM

## 2021-06-07 DIAGNOSIS — F79 Unspecified intellectual disabilities: Secondary | ICD-10-CM

## 2021-06-07 DIAGNOSIS — Z79899 Other long term (current) drug therapy: Secondary | ICD-10-CM

## 2021-06-07 DIAGNOSIS — D72819 Decreased white blood cell count, unspecified: Secondary | ICD-10-CM

## 2021-06-07 DIAGNOSIS — F840 Autistic disorder: Secondary | ICD-10-CM

## 2021-06-07 DIAGNOSIS — K5900 Constipation, unspecified: Secondary | ICD-10-CM

## 2021-06-07 DIAGNOSIS — R12 Heartburn: Secondary | ICD-10-CM

## 2021-06-07 MED ORDER — LITHIUM CARBONATE 150 MG PO CAP
ORAL_CAPSULE | ORAL | 3 refills | 90.00000 days | Status: AC
Start: 2021-06-07 — End: ?

## 2021-06-07 MED ORDER — OLANZAPINE 5 MG PO TAB
5 mg | ORAL_TABLET | Freq: Every morning | ORAL | 0 refills | 30.00000 days | Status: AC
Start: 2021-06-07 — End: ?

## 2021-06-07 MED ORDER — TEMAZEPAM 30 MG PO CAP
30 mg | ORAL_CAPSULE | Freq: Every evening | ORAL | 5 refills | Status: AC | PRN
Start: 2021-06-07 — End: ?

## 2021-06-07 MED ORDER — CLONAZEPAM 0.5 MG PO TAB
.5 mg | ORAL_TABLET | Freq: Every morning | ORAL | 2 refills | Status: AC
Start: 2021-06-07 — End: ?

## 2021-06-07 MED ORDER — CLONAZEPAM 1 MG PO TAB
1 mg | ORAL_TABLET | Freq: Every day | ORAL | 5 refills | Status: AC
Start: 2021-06-07 — End: ?

## 2021-06-07 MED ORDER — QUETIAPINE 100 MG PO TAB
ORAL_TABLET | Freq: Two times a day (BID) | 4 refills | Status: AC
Start: 2021-06-07 — End: ?

## 2021-06-07 MED ORDER — TEMAZEPAM 15 MG PO CAP
ORAL_CAPSULE | Freq: Every evening | 2 refills | Status: AC
Start: 2021-06-07 — End: ?

## 2021-06-07 MED ORDER — PROPRANOLOL 10 MG PO TAB
10 mg | ORAL_TABLET | Freq: Two times a day (BID) | ORAL | 2 refills | Status: AC
Start: 2021-06-07 — End: ?

## 2021-06-07 MED ORDER — OLANZAPINE 5 MG PO TBDI
5 mg | ORAL_TABLET | Freq: Every day | ORAL | 3 refills | 30.00000 days | Status: AC | PRN
Start: 2021-06-07 — End: ?

## 2021-06-07 MED ORDER — LAMOTRIGINE 150 MG PO TAB
150 mg | ORAL_TABLET | Freq: Two times a day (BID) | ORAL | 1 refills | Status: AC
Start: 2021-06-07 — End: ?

## 2021-06-07 MED ORDER — OLANZAPINE 20 MG PO TAB
20 mg | ORAL_TABLET | Freq: Every evening | ORAL | 1 refills | 30.00000 days | Status: AC
Start: 2021-06-07 — End: ?

## 2021-06-07 MED ORDER — QUETIAPINE 300 MG PO TAB
ORAL_TABLET | Freq: Two times a day (BID) | 4 refills | Status: AC
Start: 2021-06-07 — End: ?

## 2021-06-07 NOTE — Patient Instructions
Plan from today's visit:  Continue all medications at current doses    Please return to clinic in 2 months.  Please call the clinic to reschedule or cancel appointments if somethings changes.     If you need a medication refill, please call your pharmacy to request refills.     If you need to contact the clinic:  Clinic hours: 8 am-5 pm M-F  Clinic phone number: [phone]  Clinic fax number: [phone]  Send MyChart message    Emergency Resources:  In the event of a safety concern or suicidal thoughts, call 911 or go to the nearest emergency room.   The National Suicide Prevention Lifeline is now 75 as of 02/11/21  The Crisis Text Hotline (text (206)758-8526)     Other Resources:  The Compassionate Ear phone line  Peer-run listening service that provides non-crisis supportive listening, coping strategies, information and a reprieve from loneliness or isolation  Phone: (226) 563-3725  Open 9a - 9p daily, including holidays  National Alliance on Mental Illness: https://www.nami.org   Substance Abuse and Mental Health Services Administration: BirthdayFever.at   National human trafficking hotline: 602-138-0978 or text (484)249-7704  [employer] Violence Hotline: 1- (650)264-5063      The [employer] - Turning Point Program:  Classes, programs and tools that empower and inspire people affected by chronic or serious illness  Support groups, meditation, creative projects, nutrition groups, exercise, and many others  Every program or class is free of charge and is supported entirely by generous donors - founded in 2001  Online classes/programs available  Register for programs at least 48 hours in advance online, by e-mail, or phone    Website: https://www.[facility].com/health-resources/turning-point  Email: Santa Genera - Abarry3@ .edu  Phone: 769-319-2576      How to get started with therapy:  To start therapy at [facility], you can ask the front desk for a therapy packet; please complete this and return it to the clinic via mail or in person. You will be contacted for scheduling. In person and telehealth options are available,  If you would like to do therapy outside of [facility], there are several options:  Call your health insurance provider and ask for a list of covered providers in your area  [facility])   www.kcanxiety.com  [facility] Program - [employer] and Assessment Services  Phone: 949-806-8891  https://education.MobCommunity.ch   Sliding scale fees  www.psychologytoday.com  Filters for zip code, issues addressed, insurance, type of therapy, language, sexuality, race, faith  Online options:  SunReplacement.co.uk  https://www.talkspace.com    Community mental health centers:  [facility]  [address] [place], [place], [place] [address]  Phone: [phone]  http://www.wyandotcenter.org/  [facility]  [address] [place], [place] [address] [facility] office)  [address] [place], [place], [place] [address] ([place] office)  Phone: [phone]  UFOFinder.fi  [facility])  [id number] Dr. Beatris Si [address] [place], [place] [address]  [phone]  SodaFlavors.dk  [facility])  [address], [place], [place] [address]  Phone: [phone]  https://theguidance-ctr.org/  [facility] [place])  [address], [place], [place] [address]  Phone: [phone]  https://www.[email or web address]-[email or web address]  [facility])  [address], [place] [address]  Phone: [phone]  [email or web address]-williams.[email or web address]   [facility] [place])  [address] [place], [place] [address]; phone: [phone] [facility] office)  [address], [place], [place] [address]; phone: [phone] [facility] office)  [email or web address].[email or web address]    Intensive Outpatient Programs:   Sleep at home, but have intensive therapy multiple per week  Treatment for:  Drug or alcohol abuse or addiction   Depression   Stress and anxiety disorders   Trauma and post-traumatic stress disorder (PTSD)   Mood disorders   Personality disorders   [facility]   [address], [place], [place] [address]  [phone]  [email or web address]-[email or web address]  [facility]  [address] [place], [place] [address]  [phone]  EliteClients.be  [facility]  [address] [place], [place] [address]  [phone]  CyclingMonthly.ch

## 2021-06-07 NOTE — Progress Notes
Outpatient Psychiatry Progress Note    Subjective:    Chad Randall is a 55 yo male with a PMHx of schizoaffective?disorder, bipolar type, autism spectrum disorder, ADHD, mild intellectual disability, aggression, and Prader-Willi syndrome who presents for follow up appointment today. Wraith was last seen of by Dr. Doristine Counter on 11/10/20 at which time no medication changes were made. Zyprexa 5mg  Qam and 20mg  QHS and Zydis 5mg  Qday PRN, Seroquel 400/400/600mg , Klonopin 0.5/1mg  QHS, Lithium 300mg  Qam and 450mg  QHS, Lamictal 150mg  BID, Restoril 45mg  QHS, and propranolol 10mg  BID were continued without change. Metabolic labs ordered, last completed 10/2019.    Patient presents today accompanied by his mother, Colleen Lamunyon, who provides most of history.    Overall, things have been going well for [facility] since the last appointment. Per Kathie Rhodes, prn olanzapine is being utilized about 3 times a week. Due to multiple recent and upcoming holidays, they have been using zyprexa more due to more excitement. Since patient's move, he has become better acclimated to their current living situation. Patient was previously utilizing day services, but has been difficult to reengage due to COVID. He continues to be active. Sleep is overall okay, with some recent disturbance from multiple holidays. This pattern is noted every year. Energy levels are moderate to high. Appetite has been good. He has been doing things for fun, watching TV and playing with his new toy dog. He is overall very happy. No overt manic symptoms, depressive symptoms. No statements or observations from mother or patient indicating SI/HI/AVH.     No side effects were reported by Kathie Rhodes nor from [place]. Patient's chronic constipation improved with recent increase in creon dose.    Past Medication Trials:  Has been off Lithium in the past things got worse  Clozaril (neutropenia)  Depakote  Loxapine  Trileptal  Ritalin(for several years as a child)  Elavil (worse)  Strattera-arm dystonias  Flurazepam  Ambien  Melatonin   Wellbutrin   tegretol  Neurontin  ?- Antipsychotic monotherapy ineffective in the past    Psychiatric history update:  -Inpatient admissions: First admission was at age 32.  Was later hospitalized in [facility] for 6 years from age [age].  Was last hospitalized at [facility] in May 2010. Last hospitalized in 2015.  -Patient has a history of progressive behaviors  -History of lithium toxicity in 2009 [place] admission  ?  Social History Update:  - He enjoys coloring and playing with toy cars  - Lives with mother, father, brother    Substance Use Hx Update:  -Tobacco: denies  -Alcohol: denies  -Illicit substances: denies    Review of Systems   Respiratory: Negative for shortness of breath.    Cardiovascular: Negative for chest pain.   Gastrointestinal: Positive for constipation. Negative for abdominal pain.   Neurological: Negative for headaches.   Psychiatric/Behavioral: Negative for hallucinations and suicidal ideas.     Objective:         ? aspirin EC 81 mg tablet Take 81 mg by mouth daily. Take with food.   ? atorvastatin (LIPITOR) 20 mg tablet Take 20 mg by mouth at bedtime daily.   ? bisacodyl (DULCOLAX) 5 mg tablet Take 1 Tab by mouth twice daily.   ? clonazePAM (KLONOPIN) 0.5 mg tablet TAKE ONE TABLET BY MOUTH EVERY MORNING   ? clonazePAM (KLONOPIN) 1 mg tablet TAKE ONE TABLET BY MOUTH EVERY DAY   ? DOCOSAHEXANOIC ACID/EPA (FISH OIL PO) Take 2 Caps by mouth daily.   ? docusate (  COLACE) 100 mg capsule Take 100 mg by mouth twice daily.   ? doxycycline hyclate (VIBRACIN) 100 mg tablet Take one tablet by mouth twice daily.   ? glucosamine(+) 500 mg tab Take 500 mg by mouth daily.   ? hydrocortisone (HYTONE) 2.5 % topical cream Apply  topically to affected area twice daily.   ? lamoTRIgine (LAMICTAL) 150 mg tablet Take one tablet by mouth twice daily.   ? levothyroxine (SYNTHROID) 100 mcg tablet Take 100 mcg by mouth daily.   ? lipase-protease-amylase (CREON) 24000-76000-120000 units capsule Take three capsules by mouth three times daily with meals AND two capsules with snacks.  Up to 3 snacks per day   ? lithium carbonate (ESKALITH) 150 mg capsule TAKE TWO CAPSULES BY MOUTH IN THE MORNING AND THREE CAPSULES AT BEDTIME WITH FOOD   ? lubiprostone (AMITIZA) 24 mcg capsule Take one capsule by mouth twice daily with meals.   ? magnesium citrate oral solution Take 296 mL by mouth as Needed.   ? magnesium 400 mEq twice daily.   ? OLANZapine (ZYPREXA ZYDIS) 5 mg rapid dissolve tablet Dissolve one tablet by mouth daily as needed.   ? OLANZapine (ZYPREXA) 20 mg tablet TAKE ONE TABLET BY MOUTH AT BEDTIME   ? OLANZapine (ZYPREXA) 5 mg tablet TAKE ONE TABLET BY MOUTH EVERY MORNING   ? peg-electrolyte solution (NULYTELY LEMON-LIME) 420 gram oral solution Mix as directed on container. Drink slowly over 24 hrs. Keep cold. Eat and drink as normally. No restrictions.May complete once a month as needed.   ? plecanatide (TRULANCE) 3 mg tablet Take one tablet by mouth daily.   ? polyethylene glycol 3350 (GLYCOLAX; MIRALAX) 17 gram/dose powder Take 17 g by mouth twice daily.   ? pramoxine-hydrocortisone (ANALPRAM-HC) 1-1 % rectal cream Insert or Apply one g to rectal area as directed twice daily.   ? propranoloL (INDERAL) 10 mg tablet TAKE ONE TABLET BY MOUTH TWICE DAILY   ? QUEtiapine (SEROQUEL) 100 mg tablet TAKE ONE TABLET BY MOUTH TWICE DAILY (morning AND 2pm) along with 300mg  tab FOR daily DOSE of 400mg    ? QUEtiapine (SEROQUEL) 300 mg tablet TAKE ONE TABLET BY MOUTH TWICE DAILY (morning AND 2pm along with 100mg  tab) AND TWO tabs EVERY NIGHT AT BEDTIME   ? senna (SENOKOT) 8.6 mg tablet Take 2 Tabs by mouth twice daily.   ? tamsulosin (FLOMAX) 0.4 mg capsule Take one capsule by mouth twice daily. Take 30 min after same meal.  Do not cut/ crush/ chew.   ? temazepam (RESTORIL) 15 mg capsule TAKE ONE CAPSULE BY MOUTH EVERY NIGHT AT BEDTIME   ? temazepam (RESTORIL) 30 mg capsule TAKE ONE CAPSULE BY MOUTH AT BEDTIME AS NEEDED     Vitals:    06/07/21 1344   PainSc: Zero   Weight: 59.4 kg (131 lb)   Height: 170.7 cm (5' 7.2)     Body mass index is 20.4 kg/m?Marland Kitchen     Physical Exam  Psychiatric:      Comments: MENTAL STATUS EXAMINATION  General/Constitutional: appears stated age, dressed in personal clothes, fair grooming  Eye Contact: minimal  Behavior: Excited, aloof  Speech: RRR with normal volume and tone. Fair articulation  Mood: ok  Affect: euthymic ; mood congruent  Thought Process: circumstantial  Thought Content: denies SI, HI. No evidence of delusions  Perception: Denies AVH  Associations: Intact  Insight/Judgment: Limited         Metabolic monitoring:  Metabolic monitoring:     There is  no height or weight on file to calculate BMI.  Wt Readings from Last 3 Encounters:   03/28/21 59.6 kg (131 lb 6.4 oz)   03/22/21 66.2 kg (146 lb)   11/11/20 59.4 kg (131 lb)     BP Readings from Last 3 Encounters:   03/28/21 (!) 145/64   03/22/21 111/86   01/06/21 115/67     Lab Results   Component Value Date    CHOL 118 07/02/2018    TRIG 72 07/02/2018    HDL 44 07/02/2018    LDL 58 07/02/2018    VLDL 14 07/02/2018    NONHDLCHOL 74 07/02/2018    CHOLHDLC 3 08/02/2016     Hemoglobin A1C   Date Value Ref Range Status   07/02/2018 5.3 4.0 - 6.0 % Final     Comment:     The ADA recommends that most patients with type 1 and type 2 diabetes maintain   an A1c level <7%.         AIMS  AIMS Score (Calculated): 1 (11/11/2020  5:00 PM)        Assessment and Plan:  Tin Stankus. is a 55 y.o. male with the below diagnoses. Today, patient presents with stable symptoms on current medication regimen. Due to ongoing holiday season, which has been destabilizing in the past, will continue medication regimen without changes, despite current polypharmacy. Reviewed outside labs, no remarkable findings.    1. Schizoaffective disorder, bipolar type (HCC)    2. ADHD (attention deficit hyperactivity disorder), combined type    3. Encounter for long-term (current) use of other medications      Plan:  -No medication changes today, continue all current medications as follows:   - Olanzapine 5 mg every morning and 20 mg nightly for mood and agitation; Zydis 5 mg daily as needed for agitation   - Quetiapine 400/400/600 mg daily for mood and agitation   - Clonazepam 0.5 mg every morning and 1 mg nightly for anxiety   - Temazepam 45 mg nightly for sleep   - Lithium 300 mg every morning and 450 mg nightly for mood and agitation   - Lamotrigine 150 mg twice daily for mood and agitation   - Propranolol 10 mg twice daily for akathisia  -Monitoring labs reviewed: CMP, TSH, lipid panel, hemoglobin A1c, lithium level; to be completed at outside lab and faxed to [place]   - TSH 2.94  -Most recent EKG: 10/11/2020; QTC 470  -Aims 11/11/2020: 1 for tongue fasciculations (unchanged from previous)    Discussion  1. The proposed treatment plan was discussed with the patient who was provided the opportunity to actively take part finalizing the current treatment plan.   ? Discussed risks, benefits and potential side effects of medications with patient and guardian as well as alternative treatments  ? Discussed relevant black box warnings   ? patient reported understanding of the risks, benefits and possible side effects and provided consent for treatment unless otherwise stated  2. Discussed contingency/emergency plans with the patient should there be concern for attempting suicide, committing self-harm or other potential injuries to self or others  1. These plans include:  ? Contacting the mental health clinic (calling, walk-in, etc.)  ? Calling 911 or calling the crisis line  ? Visiting the ER at any time    Return to clinic in 2 months    Patient discussed with Dr. Bertram Millard    Safety plan discussed at length and outlined in detail on  after visit summary    The proposed treatment plan was discussed with the patient/guardian who was provided the opportunity to ask questions and make suggestions regarding alternative treatment.

## 2021-06-07 NOTE — Progress Notes
ATTENDING NOTE  I saw and evaluated Chad Randall., discussed with [name], MD and concur with the assessment and treatment plan. Patient is 55 y.o. male with ADHD, Schizoaffective Disorder and ASD. Psychiatric symptoms well controlled at today's encounter, but some stress related to upcoming holidays. Denies SI/HI and AVH and no other safety concerns. Pt reports no medication side effects.    PLAN:  The following medications will be continued for the above symptoms:               - Olanzapine 5 mg every morning and 20 mg nightly for mood and agitation; Zydis 5 mg daily as needed for agitation               - Quetiapine 400/400/600 mg daily for mood and agitation               - Clonazepam 0.5 mg every morning and 1 mg nightly for anxiety               - Temazepam 45 mg nightly for sleep               - Lithium 300 mg every morning and 450 mg nightly for mood and agitation               - Lamotrigine 150 mg twice daily for mood and agitation               - Propranolol 10 mg twice daily for akathisia   No labs needed, last drawn June 2022    Dimple Casey, MD  06/07/2021

## 2021-06-27 ENCOUNTER — Encounter: Admit: 2021-06-27 | Discharge: 2021-06-27 | Payer: MEDICARE

## 2021-07-04 ENCOUNTER — Encounter: Admit: 2021-07-04 | Discharge: 2021-07-04 | Payer: MEDICARE

## 2021-07-05 ENCOUNTER — Encounter: Admit: 2021-07-05 | Discharge: 2021-07-05 | Payer: MEDICARE

## 2021-07-05 MED FILL — TRULANCE 3 MG PO TAB: 3 mg | ORAL | 30 days supply | Qty: 30 | Fill #2 | Status: AC

## 2021-07-13 ENCOUNTER — Encounter: Admit: 2021-07-13 | Discharge: 2021-07-13 | Payer: MEDICARE

## 2021-07-13 DIAGNOSIS — K5904 Chronic idiopathic constipation: Secondary | ICD-10-CM

## 2021-07-13 DIAGNOSIS — K5900 Constipation, unspecified: Secondary | ICD-10-CM

## 2021-07-13 DIAGNOSIS — F250 Schizoaffective disorder, bipolar type: Secondary | ICD-10-CM

## 2021-07-13 MED ORDER — OLANZAPINE 5 MG PO TAB
5 mg | ORAL_TABLET | Freq: Every morning | ORAL | 0 refills | 30.00000 days | Status: AC
Start: 2021-07-13 — End: ?

## 2021-07-13 MED ORDER — LUBIPROSTONE 24 MCG PO CAP
24 ug | ORAL_CAPSULE | Freq: Two times a day (BID) | ORAL | 5 refills
Start: 2021-07-13 — End: ?

## 2021-07-13 NOTE — Telephone Encounter
Last office visit 06/07/2021  Olanzapine 5 mg every morning   Upcoming visit 08/23/2021  Medication refilled per protocol

## 2021-07-21 ENCOUNTER — Encounter: Admit: 2021-07-21 | Discharge: 2021-07-21 | Payer: MEDICARE

## 2021-08-04 ENCOUNTER — Encounter: Admit: 2021-08-04 | Discharge: 2021-08-04 | Payer: MEDICARE

## 2021-08-06 ENCOUNTER — Encounter: Admit: 2021-08-06 | Discharge: 2021-08-06 | Payer: MEDICARE

## 2021-08-06 MED FILL — CREON 24,000-76,000 -120,000 UNIT PO CPDR: 24000-76000 -120,000 unit | ORAL | 90 days supply | Qty: 1350 | Fill #2 | Status: AC

## 2021-08-13 ENCOUNTER — Encounter: Admit: 2021-08-13 | Discharge: 2021-08-13 | Payer: MEDICARE

## 2021-08-13 DIAGNOSIS — F250 Schizoaffective disorder, bipolar type: Secondary | ICD-10-CM

## 2021-08-13 MED ORDER — OLANZAPINE 5 MG PO TAB
5 mg | ORAL_TABLET | Freq: Every morning | ORAL | 0 refills
Start: 2021-08-13 — End: ?

## 2021-08-13 MED ORDER — OLANZAPINE 20 MG PO TAB
ORAL_TABLET | Freq: Every evening | 0 refills
Start: 2021-08-13 — End: ?

## 2021-08-30 ENCOUNTER — Encounter: Admit: 2021-08-30 | Discharge: 2021-08-30 | Payer: MEDICARE

## 2021-09-11 ENCOUNTER — Encounter: Admit: 2021-09-11 | Discharge: 2021-09-11 | Payer: MEDICARE

## 2021-09-11 DIAGNOSIS — F250 Schizoaffective disorder, bipolar type: Secondary | ICD-10-CM

## 2021-09-11 MED ORDER — TEMAZEPAM 15 MG PO CAP
ORAL_CAPSULE | Freq: Every evening | 2 refills
Start: 2021-09-11 — End: ?

## 2021-09-11 MED ORDER — OLANZAPINE 5 MG PO TAB
5 mg | ORAL_TABLET | Freq: Every morning | ORAL | 2 refills
Start: 2021-09-11 — End: ?

## 2021-09-11 MED ORDER — PROPRANOLOL 10 MG PO TAB
10 mg | ORAL_TABLET | Freq: Two times a day (BID) | ORAL | 2 refills
Start: 2021-09-11 — End: ?

## 2021-09-11 MED ORDER — CLONAZEPAM 0.5 MG PO TAB
.5 mg | ORAL_TABLET | Freq: Every morning | ORAL | 2 refills
Start: 2021-09-11 — End: ?

## 2021-09-11 MED ORDER — OLANZAPINE 20 MG PO TAB
ORAL_TABLET | Freq: Every evening | 2 refills
Start: 2021-09-11 — End: ?

## 2021-09-27 ENCOUNTER — Ambulatory Visit: Admit: 2021-09-27 | Discharge: 2021-09-27 | Payer: MEDICARE

## 2021-09-27 ENCOUNTER — Encounter: Admit: 2021-09-27 | Discharge: 2021-09-27 | Payer: MEDICARE

## 2021-09-27 DIAGNOSIS — F902 Attention-deficit hyperactivity disorder, combined type: Secondary | ICD-10-CM

## 2021-09-27 DIAGNOSIS — F250 Schizoaffective disorder, bipolar type: Secondary | ICD-10-CM

## 2021-09-27 DIAGNOSIS — Z79899 Other long term (current) drug therapy: Secondary | ICD-10-CM

## 2021-09-27 DIAGNOSIS — R451 Restlessness and agitation: Secondary | ICD-10-CM

## 2021-09-27 DIAGNOSIS — F840 Autistic disorder: Secondary | ICD-10-CM

## 2021-09-27 LAB — CBC AND DIFF
ABSOLUTE BASO COUNT: 0 K/UL (ref 0–0.20)
ABSOLUTE EOS COUNT: 0 K/UL (ref 0–0.45)
ABSOLUTE LYMPH COUNT: 1.2 K/UL (ref 1.0–4.8)
ABSOLUTE MONO COUNT: 0.3 K/UL (ref 0–0.80)
ABSOLUTE NEUTROPHIL: 2.1 K/UL (ref 1.8–7.0)
BASOPHILS %: 0 % (ref 0–2)
EOSINOPHILS %: 0 % (ref 0–5)
HEMATOCRIT: 40 % (ref 40–50)
HEMOGLOBIN: 13 g/dL (ref 13.5–16.5)
LYMPHOCYTES %: 33 % (ref 24–44)
MCH: 32 pg (ref 26–34)
MCHC: 34 g/dL (ref 32.0–36.0)
MCV: 96 FL (ref 80–100)
MONOCYTES %: 9 % (ref 4–12)
MPV: 6.9 FL — ABNORMAL LOW (ref 7–11)
NEUTROPHILS %: 58 % (ref 41–77)
PLATELET COUNT: 189 K/UL (ref 150–400)
RBC COUNT: 4.1 M/UL — ABNORMAL LOW (ref 4.4–5.5)
RDW: 13 % (ref 11–15)
WBC COUNT: 3.7 K/UL — ABNORMAL LOW (ref 4.5–11.0)

## 2021-09-27 NOTE — Progress Notes
Outpatient Psychiatry Progress Note    Subjective:  Chad Randall is a 56 y.o. male with a PMHx of schizoaffective?disorder, bipolar type, autism spectrum disorder, ADHD, mild intellectual disability, aggression, and Prader-Willi syndrome who presents for follow up appointment today. Hashem was last seen on 06/07/21 at which time no medication changes were made. Zyprexa 5mg  Qam and 20mg  QHS and Zydis 5mg  Qday PRN, Seroquel 400/400/600mg , Klonopin 0.5/1mg  QHS, Lithium 300mg  Qam and 450mg  QHS, Lamictal 150mg  BID, Restoril 45mg  QHS, and propranolol 10mg  BID were continued without change. Metabolic labs ordered, last completed 10/2019.    Patient presents today accompanied by his mother, Chad Randall, who provides most of history. She reports that he has been hyper some days and sleepy some days. Denies significant mood changes such as depression, mania, or AVH. The prn olanzapine is being used a couple of times a week. He is not currently in day-services, notes fear of COVID, fear that this would hit him hard. Holidays went fairly well, have been destabilizing in the past. Appetite has been good. Sleep is really good. Energy levels have been good. His mood overall has been happy. Has been enjoying his toy dog. GI symptoms are doing fairly well currently, recently started on Plecanitide, which has been helpful for constipation.    Denies SI/HI/AVH.    Past Medication Trials:  Has been off Lithium in the past things got worse  Clozaril (neutropenia)  Depakote  Loxapine  Trileptal  Ritalin(for several years as a child)  Elavil (worse)  Strattera-arm dystonias  Flurazepam  Ambien  Melatonin   Wellbutrin   tegretol  Neurontin  ?- Antipsychotic monotherapy ineffective in the past    Psychiatric history update:  -Inpatient admissions: First admission was at age 75.  Was later hospitalized in [facility] for 6 years from age [age].  Was last hospitalized at [facility] in May 2010. Last hospitalized in 2015.  -Patient has a history of progressive behaviors  -History of lithium toxicity in 2009 [place] admission  ?  Social History Update:  - He enjoys coloring and playing with toy cars  - Lives with mother, father, brother    Substance Use Hx Update:  -Tobacco: denies  -Alcohol: denies  -Illicit substances: denies    Review of Systems   Respiratory: Negative for shortness of breath.    Cardiovascular: Negative for chest pain.   Gastrointestinal: Positive for constipation. Negative for abdominal pain.   Neurological: Negative for headaches.   Psychiatric/Behavioral: Negative for hallucinations and suicidal ideas.     Objective:         ? aspirin EC 81 mg tablet Take 81 mg by mouth daily. Take with food.   ? atorvastatin (LIPITOR) 20 mg tablet Take 20 mg by mouth at bedtime daily.   ? bisacodyl (DULCOLAX) 5 mg tablet Take 1 Tab by mouth twice daily.   ? clonazePAM (KLONOPIN) 0.5 mg tablet TAKE ONE TABLET BY MOUTH EVERY MORNING   ? clonazePAM (KLONOPIN) 1 mg tablet Take one tablet by mouth daily.   ? DOCOSAHEXANOIC ACID/EPA (FISH OIL PO) Take 2 Caps by mouth daily.   ? docusate (COLACE) 100 mg capsule Take 100 mg by mouth twice daily.   ? doxycycline hyclate (VIBRACIN) 100 mg tablet Take one tablet by mouth twice daily.   ? glucosamine(+) 500 mg tab Take 500 mg by mouth daily.   ? hydrocortisone (HYTONE) 2.5 % topical cream Apply  topically to affected area twice daily.   ? lamoTRIgine (LAMICTAL) 150  mg tablet Take one tablet by mouth twice daily.   ? levothyroxine (SYNTHROID) 100 mcg tablet Take 100 mcg by mouth daily.   ? lipase-protease-amylase (CREON) 24000-76000-120000 units capsule Take three capsules by mouth three times daily with meals AND two capsules with snacks.  Up to 3 snacks per day   ? lithium carbonate (ESKALITH) 150 mg capsule TAKE TWO CAPSULES BY MOUTH IN THE MORNING AND THREE CAPSULES AT BEDTIME WITH FOOD   ? lubiprostone (AMITIZA) 24 mcg capsule TAKE ONE CAPSULE BY MOUTH TWICE DAILY WITH MEALS   ? magnesium citrate oral solution Take 296 mL by mouth as Needed.   ? magnesium 400 mEq twice daily.   ? OLANZapine (ZYPREXA ZYDIS) 5 mg rapid dissolve tablet Dissolve one tablet by mouth daily as needed.   ? OLANZapine (ZYPREXA) 20 mg tablet TAKE ONE TABLET BY MOUTH EVERY NIGHT AT BEDTIME   ? OLANZapine (ZYPREXA) 5 mg tablet TAKE ONE TABLET BY MOUTH EVERY MORNING   ? peg-electrolyte solution (NULYTELY LEMON-LIME) 420 gram oral solution Mix as directed on container. Drink slowly over 24 hrs. Keep cold. Eat and drink as normally. No restrictions.May complete once a month as needed.   ? plecanatide (TRULANCE) 3 mg tablet Take one tablet by mouth daily.   ? polyethylene glycol 3350 (GLYCOLAX; MIRALAX) 17 gram/dose powder Take 17 g by mouth twice daily.   ? pramoxine-hydrocortisone (ANALPRAM-HC) 1-1 % rectal cream Insert or Apply one g to rectal area as directed twice daily.   ? propranoloL (INDERAL) 10 mg tablet TAKE ONE TABLET BY MOUTH TWICE DAILY   ? QUEtiapine (SEROQUEL) 100 mg tablet TAKE ONE TABLET BY MOUTH TWICE DAILY (morning AND 2pm) along with 300mg  tab FOR daily DOSE of 400mg    ? QUEtiapine (SEROQUEL) 300 mg tablet TAKE ONE TABLET BY MOUTH TWICE DAILY (morning AND 2pm along with 100mg  tab) AND TWO tabs EVERY NIGHT AT BEDTIME   ? senna (SENOKOT) 8.6 mg tablet Take 2 Tabs by mouth twice daily.   ? tamsulosin (FLOMAX) 0.4 mg capsule Take one capsule by mouth twice daily. Take 30 min after same meal.  Do not cut/ crush/ chew.   ? temazepam (RESTORIL) 15 mg capsule TAKE ONE CAPSULE BY MOUTH EVERY NIGHT AT BEDTIME IN ADDITION TO 30MG  FOR A TOTAL OF 45MG  AT BEDTIME   ? temazepam (RESTORIL) 30 mg capsule Take one capsule by mouth at bedtime as needed.     Vitals:    09/27/21 1015   BP: 117/76   Pulse: 66   Weight: 60.7 kg (133 lb 12.8 oz)   Height: 167.6 cm (5' 6)     Body mass index is 21.6 kg/m?Marland Kitchen     Physical Exam  Psychiatric:      Comments: MENTAL STATUS EXAMINATION  General/Constitutional: appears stated age, dressed in personal clothes, fair grooming  Eye Contact: minimal  Behavior: Excited, aloof  Speech: RRR with normal volume and tone. Fair articulation  Mood: ok  Affect: euthymic ; mood congruent  Thought Process: circumstantial  Thought Content: denies SI, HI. No evidence of delusions  Perception: Denies AVH  Associations: Intact  Insight/Judgment: Limited       Metabolic monitoring:  Metabolic monitoring:     There is no height or weight on file to calculate BMI.  Wt Readings from Last 3 Encounters:   06/07/21 59.4 kg (131 lb)   03/28/21 59.6 kg (131 lb 6.4 oz)   03/22/21 66.2 kg (146 lb)     BP Readings from  Last 3 Encounters:   03/28/21 (!) 145/64   03/22/21 111/86   01/06/21 115/67     Lab Results   Component Value Date    CHOL 118 07/02/2018    TRIG 72 07/02/2018    HDL 44 07/02/2018    LDL 58 07/02/2018    VLDL 14 07/02/2018    NONHDLCHOL 74 07/02/2018    CHOLHDLC 3 08/02/2016     Hemoglobin A1C   Date Value Ref Range Status   07/02/2018 5.3 4.0 - 6.0 % Final     Comment:     The ADA recommends that most patients with type 1 and type 2 diabetes maintain   an A1c level <7%.         AIMS  AIMS Score (Calculated): 1 (11/11/2020  5:00 PM)    Assessment and Plan:  Chad Cocker. is a 56 y.o. male with the below diagnoses. Today, patient presents with stable symptoms on current medication regimen. We discussed polypharmacy, however patient continues to require PRN zyprexa, so we hesitate to reduce medications at this time. Reviewed outside labs, requested to obtain updated labs for monitoring of metabolics, renal function, thyroid, lithium level today. No safety concerns.    1. Schizoaffective disorder, bipolar type (HCC)    2. ADHD (attention deficit hyperactivity disorder), combined type    3. Autism    4. Long term current use of antipsychotic medication      Plan:  -No medication changes today, continue all current medications as follows:   - Olanzapine 5 mg every morning and 20 mg nightly for mood and agitation;    - Zydis 5 mg daily as needed for agitation, using twice a week   - Quetiapine 400/400/600 mg daily for mood and agitation   - Clonazepam 0.5 mg every morning and 1 mg nightly for anxiety   - Temazepam 45 mg nightly for sleep   - Lithium 300 mg every morning and 450 mg nightly for mood and agitation   - Lamotrigine 150 mg twice daily for mood and agitation   - Propranolol 10 mg twice daily for akathisia  -Monitoring labs to be obtained: CMP, TSH, lipid panel, hemoglobin A1c, lithium level. To be completed at [facility].   - 6/22 Last lipid panel WNL   - 6/22 Scr 1.27  -Most recent EKG: 10/11/2020; QTC 470  -Aims 11/11/2020: 1 for tongue fasciculations (unchanged from previous)    Discussion  1. The proposed treatment plan was discussed with the patient who was provided the opportunity to actively take part finalizing the current treatment plan.   ? Discussed risks, benefits and potential side effects of medications with patient and guardian as well as alternative treatments  ? Discussed relevant black box warnings   ? patient reported understanding of the risks, benefits and possible side effects and provided consent for treatment unless otherwise stated  2. Discussed contingency/emergency plans with the patient should there be concern for attempting suicide, committing self-harm or other potential injuries to self or others  1. These plans include:  ? Contacting the mental health clinic (calling, walk-in, etc.)  ? Calling 911 or calling the crisis line  ? Visiting the ER at any time    Return to clinic in 3 months    Patient discussed with Dr. Georjean Mode    Safety plan discussed at length and outlined in detail on after visit summary    The proposed treatment plan was discussed with the patient/guardian who was provided the  opportunity to ask questions and make suggestions regarding alternative treatment.

## 2021-09-27 NOTE — Progress Notes
ATTENDING NOTE           I personally interviewed and performed key portions of the E/M visit with Chad Randall., discussed the patient's care with Dr. [name], MD, and participated in formulating the patient's treatment plan.  I reviewed the resident's note and agree with the documented findings and plan of care except as otherwise noted; might consider reducing propranolol to PRN only dosing since it appears to have been added for akathisia in March 2020 (when pt receiving 40mg  TDD of olanzapine).    [name] 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 pylori.  Pt has been quasi-stable on polypharmacy (including olanzapine, quetiapine, lithium, lamotrigine, benzos, and propranolol) but has frequently decompensated with attempts to taper; tried clozapine many years ago but D/Cd d/t neutropenia.  Hx also notable for past trial of clonidine that was ineffective for hyperactivity; mother reports h/o methylphenidate tx as a child.  Pt has lived with parents and a younger brother for 10+ years.    Was last seen for med F/U on 06/07/21, when he was continued on Zyprexa Zydis 5mg  qday PRN agitation + olanzapine 25mg /day (5 + 20mg  split dosing), quetiapine 1400mg /day (400-400-600mg  tid dosing), lithium 750mg /day (300 + 450mg  split dosing), lamotrigine 150mg  bid, clonazepam 0.5mg  qAM + 1mg  qhs, and temazepam 45mg  qhs.      Mother reports that the pt did better over the recent holidays than in years past; however, she notes ongoing utilization of PRN Zyprexa Zydis about twice week to control agitation.  Mother notes typical daily episodes of restlessness as well as occasional hyper behavior, but denies problems with depressed mood, insomnia, or psychosis.  She remains quite concerned about risks of attempting to reduce polypharmacy, but thinks that cautious reduction of quetiapine might be the least risky based on her recollection of the pt's hx; she specifically recalled decompensation the last time that an attempt to reduce lithium was made years ago.    Per chart review, pt required an increase in clonazepam in July 2019 followed by an increase in lamotrigine in Oct 2019; pt appears to have developed akathisia around March 2020 when PRN dosing of olanzapine for agitation resulted in TDD of 40mg /day over a period of about a month.  At that time, propranolol was started and olanzapine decreased to maximum of 30mg /day (5mg  bid PRN + 20mg  qhs) with change to the current medication regimen in April 2020.  Pt appears to have continued on unchanged doses of meds since April 2020.    Weight increased by 2-3lbs over the last 10-11 months  01/06/21 FLP wnl  11/05/19 A1c = 4.7    01/06/21 creatinine = 1.29 with eGFR = 61.2; TSH = 2.85, LFTs wnl  11/05/19 lithium level = 1.0    01/03/21 WBC, Hgb/Hct, and plts wnl    10/11/20 QTc = 470    11/11/20 AIMS = 1    Staff Name:  Chrys Racer, MD

## 2021-10-03 ENCOUNTER — Encounter: Admit: 2021-10-03 | Discharge: 2021-10-03 | Payer: MEDICARE

## 2021-10-04 ENCOUNTER — Encounter: Admit: 2021-10-04 | Discharge: 2021-10-04 | Payer: MEDICARE

## 2021-10-04 MED FILL — TRULANCE 3 MG PO TAB: 3 mg | ORAL | 30 days supply | Qty: 30 | Fill #3 | Status: AC

## 2021-11-02 NOTE — Progress Notes
Date of Service: 11/06/2021     Subjective:             Chad Randall. is a 56 y.o. male    History of Present Illness    Chad Randall.?is a 55?y.o.?Male,?with past medical history including schizophrenia, bipolar disorder, autism, ADHD, constipation, small bowel obstruction. He has previously seen Dr. Oswaldo Milian in 2018 for evaluation of LUTS.?    He?returns?to clinic for evaluation of lower urinary tract symptoms.?He presents with his mother, who provides all the history during the appointment.   She reports that he has been doing very well since his appointment last year.  He continues to take Flomax 0.4 mg twice daily.  He reports him having less difficulties with urination.  She states that he does occasionally still return to the bathroom soon after voiding but less often.  She reports that his frequency has remained about the same.  He is currently only waking up once overnight.    She reports making significant improvements with gastrointestinal issues including his history of constipation.  She reports with his current regimen that he often has steady bowel movements.  She does state that once a month he will have an episode of constipation and abdominal distention.  ?  PVR: 18 mL (prev 312 mL)    Medical History:   Diagnosis Date   ? Adverse effect     neuroleptic drugs   ? Autism    ? Constipation 10/28/2010   ? Dystonia    ? H. pylori infection    ? Headache(784.0) 08/26/2006   ? Heartburn symptom    ? History of fall     usually associated with urination or getting to feet   ? Hypothyroidism    ? Long term use of drug     antipsychotic drugs   ? Mental retardation    ? Rash     raised red rash, itches on belly, trunk, and underarms   ? SBO (small bowel obstruction) (HCC)    ? Syncope and collapse 08/26/2006   ? WBC decreased        Surgical History:   Procedure Laterality Date   ? ESOPHAGOGASTRODUODENOSCOPY ENDOSCOPIC ULTRASOUND & colonoscopy at [facility] only regarding weight loss N/A 06/17/2017 Performed by Bernita Buffy, MD at [facility] ENDO   ? COLONOSCOPY & EGD/EUS N/A 06/17/2017    Performed by Bernita Buffy, MD at [facility] ENDO   ? ESOPHAGOGASTRODUODENOSCOPY BIOPSY  06/17/2017    Performed by Bernita Buffy, MD at [facility] ENDO   ? COLONOSCOPY BIOPSY  06/17/2017    Performed by Bernita Buffy, MD at [facility] ENDO   ? BREATH HYDROGEN/ METHANE TESTING w/ glucose N/A 12/24/2017    Performed by Tim Lair, MD at [facility] ENDO   ? HX BUNIONECTOMY     ? HX TONSIL AND ADENOIDECTOMY     ? HX VASECTOMY         Family History   Problem Relation Age of Onset   ? Other Father    ? Bipolar Disorder Father    ? Hypertension Father    ? Heart Attack Father    ? High Cholesterol Father    ? Stroke Father    ? Depression Father    ? Other Paternal Grandfather    ? Heart Attack Paternal Grandfather    ? Hypertension Paternal Grandfather    ? Stroke Paternal Grandfather    ? Depression Paternal Grandfather    ? Colon Polyps Mother    ?  High Cholesterol Mother    ? Migraines Mother    ? Heart Attack Maternal Grandfather    ? Stroke Maternal Grandfather    ? Heart Attack Paternal Grandmother    ? Cancer-Colon Neg Hx        Current Outpatient Medications   Medication Sig Dispense Refill   ? aspirin EC 81 mg tablet Take one tablet by mouth daily. Take with food.     ? atorvastatin (LIPITOR) 20 mg tablet Take one tablet by mouth at bedtime daily.     ? bisacodyl (DULCOLAX) 5 mg tablet Take 1 Tab by mouth twice daily. 30 Tab 0   ? clonazePAM (KLONOPIN) 0.5 mg tablet TAKE ONE TABLET BY MOUTH EVERY MORNING 30 tablet 2   ? clonazePAM (KLONOPIN) 1 mg tablet Take one tablet by mouth daily. 30 tablet 5   ? DOCOSAHEXANOIC ACID/EPA (FISH OIL PO) Take 2 Caps by mouth daily.     ? docusate (COLACE) 100 mg capsule Take one capsule by mouth twice daily.     ? doxycycline hyclate (VIBRACIN) 100 mg tablet Take one tablet by mouth twice daily. 20 tablet 0   ? glucosamine(+) 500 mg tab Take one tablet by mouth daily.     ? hydrocortisone (HYTONE) 2.5 % topical cream Apply  topically to affected area twice daily. 30 g 0   ? lamoTRIgine (LAMICTAL) 150 mg tablet Take one tablet by mouth twice daily. 180 tablet 1   ? levothyroxine (SYNTHROID) 100 mcg tablet Take one tablet by mouth daily.     ? lithium carbonate (ESKALITH) 150 mg capsule TAKE TWO CAPSULES BY MOUTH IN THE MORNING AND THREE CAPSULES AT BEDTIME WITH FOOD 150 capsule 3   ? lubiprostone (AMITIZA) 24 mcg capsule TAKE ONE CAPSULE BY MOUTH TWICE DAILY WITH MEALS 180 capsule 3   ? magnesium citrate oral solution Take 296 mL by mouth as Needed.     ? magnesium four hundred mEq twice daily.     ? OLANZapine (ZYPREXA ZYDIS) 5 mg rapid dissolve tablet Dissolve one tablet by mouth daily as needed. 30 tablet 3   ? OLANZapine (ZYPREXA) 20 mg tablet TAKE ONE TABLET BY MOUTH EVERY NIGHT AT BEDTIME 30 tablet 2   ? OLANZapine (ZYPREXA) 5 mg tablet TAKE ONE TABLET BY MOUTH EVERY MORNING 30 tablet 2   ? peg-electrolyte solution (NULYTELY LEMON-LIME) 420 gram oral solution Mix as directed on container. Drink slowly over 24 hrs. Keep cold. Eat and drink as normally. No restrictions.May complete once a month as needed. 4000 mL 12   ? plecanatide (TRULANCE) 3 mg tablet Take one tablet by mouth daily. 30 tablet 2   ? polyethylene glycol 3350 (GLYCOLAX; MIRALAX) 17 gram/dose powder Take seventeen g by mouth twice daily.     ? pramoxine-hydrocortisone (ANALPRAM-HC) 1-1 % rectal cream Insert or Apply one g to rectal area as directed twice daily. 30 g 1   ? propranoloL (INDERAL) 10 mg tablet TAKE ONE TABLET BY MOUTH TWICE DAILY 60 tablet 2   ? QUEtiapine (SEROQUEL) 100 mg tablet TAKE ONE TABLET BY MOUTH TWICE DAILY (morning AND 2pm) along with 300mg  tab FOR daily DOSE of 400mg  180 tablet 4   ? QUEtiapine (SEROQUEL) 300 mg tablet TAKE ONE TABLET BY MOUTH TWICE DAILY (morning AND 2pm along with 100mg  tab) AND TWO tabs EVERY NIGHT AT BEDTIME 360 tablet 4   ? senna (SENOKOT) 8.6 mg tablet Take two tablets by mouth twice daily.     ?  tamsulosin (FLOMAX) 0.4 mg capsule Take one capsule by mouth twice daily. Take 30 min after same meal.  Do not cut/ crush/ chew. 60 capsule 11   ? temazepam (RESTORIL) 15 mg capsule TAKE ONE CAPSULE BY MOUTH EVERY NIGHT AT BEDTIME IN ADDITION TO 30MG  FOR A TOTAL OF 45MG  AT BEDTIME 30 capsule 2   ? temazepam (RESTORIL) 30 mg capsule Take one capsule by mouth at bedtime as needed. 30 capsule 5     No current facility-administered medications for this visit.       Allergies   Allergen Reactions   ? Diphenhydramine-Zinc Acetate AGITATION     Patient becomes agiitated and combative   ? Haloperidol Lactate AGITATION     Severe agitation per mother   ? Albuterol SEE COMMENTS     Extreme hyperactivity-per mom   ? Amitriptyline SEE COMMENTS     Uncontrollable muscle movements per mother   ? Antihistamine-1 AGITATION     Patient becomes agiitated and combative   ? Atomoxetine UNKNOWN   ? Bee Sting [Venom-Honey Bee] UNKNOWN   ? Chlorpheniram-Dm-Acetaminophen AGITATION     Patient becomes agiitated and combative   ? Clemastine UNKNOWN   ? Clozaril [Clozapine] AGITATION     Extreme anger/agitation   ? Depakote [Divalproex] SEE COMMENTS     Per mother worked well to control behavior, but caused liver tests to become abnormal so was stopped.     ? Erythromycin RASH     Allergy recorded in SMS: E-MYCIN~Reactions: RASH   ? Flurazepam UNKNOWN   ? Melatonin AGITATION   ? Phenylpropanolamine UNKNOWN   ? Pseudoephedrine UNKNOWN   ? Tripelennamine UNKNOWN   ? Triprolidine UNKNOWN   ? Zolpidem AGITATION       Social History     Socioeconomic History   ? Marital status: Single   Occupational History     Employer: [employer]   Tobacco Use   ? Smoking status: Never   ? Smokeless tobacco: Never   Substance and Sexual Activity   ? Alcohol use: No   ? Drug use: No         Review of Systems   Constitutional: Negative for activity change, appetite change, chills, diaphoresis, fatigue, fever and unexpected weight change.   HENT: Negative for congestion, hearing loss, mouth sores and sinus pressure.    Eyes: Negative for visual disturbance.   Respiratory: Negative for apnea, cough, chest tightness and shortness of breath.    Cardiovascular: Negative for chest pain, palpitations and leg swelling.   Gastrointestinal: Negative for abdominal pain, blood in stool, constipation, diarrhea, nausea, rectal pain and vomiting.   Genitourinary: Negative for decreased urine volume, difficulty urinating, dysuria, enuresis, flank pain, frequency, genital sores, hematuria, penile discharge, penile pain, penile swelling, scrotal swelling, testicular pain and urgency.   Musculoskeletal: Negative for arthralgias, back pain, gait problem and myalgias.   Skin: Negative for rash and wound.   Neurological: Negative for dizziness, tremors, seizures, syncope, weakness, light-headedness, numbness and headaches.   Hematological: Negative for adenopathy. Does not bruise/bleed easily.   Psychiatric/Behavioral: Negative for decreased concentration and dysphoric mood. The patient is not nervous/anxious.        Objective:         ? aspirin EC 81 mg tablet Take one tablet by mouth daily. Take with food.   ? atorvastatin (LIPITOR) 20 mg tablet Take one tablet by mouth at bedtime daily.   ? bisacodyl (DULCOLAX) 5 mg tablet Take 1 Tab by  mouth twice daily.   ? clonazePAM (KLONOPIN) 0.5 mg tablet TAKE ONE TABLET BY MOUTH EVERY MORNING   ? clonazePAM (KLONOPIN) 1 mg tablet Take one tablet by mouth daily.   ? DOCOSAHEXANOIC ACID/EPA (FISH OIL PO) Take 2 Caps by mouth daily.   ? docusate (COLACE) 100 mg capsule Take one capsule by mouth twice daily.   ? doxycycline hyclate (VIBRACIN) 100 mg tablet Take one tablet by mouth twice daily.   ? glucosamine(+) 500 mg tab Take one tablet by mouth daily.   ? hydrocortisone (HYTONE) 2.5 % topical cream Apply  topically to affected area twice daily.   ? lamoTRIgine (LAMICTAL) 150 mg tablet Take one tablet by mouth twice daily.   ? levothyroxine (SYNTHROID) 100 mcg tablet Take one tablet by mouth daily.   ? lithium carbonate (ESKALITH) 150 mg capsule TAKE TWO CAPSULES BY MOUTH IN THE MORNING AND THREE CAPSULES AT BEDTIME WITH FOOD   ? lubiprostone (AMITIZA) 24 mcg capsule TAKE ONE CAPSULE BY MOUTH TWICE DAILY WITH MEALS   ? magnesium citrate oral solution Take 296 mL by mouth as Needed.   ? magnesium four hundred mEq twice daily.   ? OLANZapine (ZYPREXA ZYDIS) 5 mg rapid dissolve tablet Dissolve one tablet by mouth daily as needed.   ? OLANZapine (ZYPREXA) 20 mg tablet TAKE ONE TABLET BY MOUTH EVERY NIGHT AT BEDTIME   ? OLANZapine (ZYPREXA) 5 mg tablet TAKE ONE TABLET BY MOUTH EVERY MORNING   ? peg-electrolyte solution (NULYTELY LEMON-LIME) 420 gram oral solution Mix as directed on container. Drink slowly over 24 hrs. Keep cold. Eat and drink as normally. No restrictions.May complete once a month as needed.   ? plecanatide (TRULANCE) 3 mg tablet Take one tablet by mouth daily.   ? polyethylene glycol 3350 (GLYCOLAX; MIRALAX) 17 gram/dose powder Take seventeen g by mouth twice daily.   ? pramoxine-hydrocortisone (ANALPRAM-HC) 1-1 % rectal cream Insert or Apply one g to rectal area as directed twice daily.   ? propranoloL (INDERAL) 10 mg tablet TAKE ONE TABLET BY MOUTH TWICE DAILY   ? QUEtiapine (SEROQUEL) 100 mg tablet TAKE ONE TABLET BY MOUTH TWICE DAILY (morning AND 2pm) along with 300mg  tab FOR daily DOSE of 400mg    ? QUEtiapine (SEROQUEL) 300 mg tablet TAKE ONE TABLET BY MOUTH TWICE DAILY (morning AND 2pm along with 100mg  tab) AND TWO tabs EVERY NIGHT AT BEDTIME   ? senna (SENOKOT) 8.6 mg tablet Take two tablets by mouth twice daily.   ? tamsulosin (FLOMAX) 0.4 mg capsule Take one capsule by mouth twice daily. Take 30 min after same meal.  Do not cut/ crush/ chew.   ? temazepam (RESTORIL) 15 mg capsule TAKE ONE CAPSULE BY MOUTH EVERY NIGHT AT BEDTIME IN ADDITION TO 30MG  FOR A TOTAL OF 45MG  AT BEDTIME   ? temazepam (RESTORIL) 30 mg capsule Take one capsule by mouth at bedtime as needed.     Vitals:    11/06/21 0747   BP: 129/82   Pulse: 76   Temp: 36.6 ?C (97.8 ?F)   Weight: 59.9 kg (132 lb)     Body mass index is 21.31 kg/m?Marland Kitchen       Physical Exam  Constitutional:       General: He is not in acute distress.     Appearance: He is well-developed. He is not diaphoretic.   HENT:      Head: Normocephalic and atraumatic.   Eyes:      General:  Right eye: No discharge.         Left eye: No discharge.      Conjunctiva/sclera: Conjunctivae normal.   Cardiovascular:      Rate and Rhythm: Normal rate.   Pulmonary:      Effort: Pulmonary effort is normal. No respiratory distress.   Musculoskeletal:         General: No deformity.      Cervical back: Normal range of motion.   Skin:     General: Skin is warm and dry.   Neurological:      Mental Status: He is alert and oriented to person, place, and time.   Psychiatric:         Behavior: Behavior normal.         Thought Content: Thought content normal.         Judgment: Judgment normal.       RBUS (01/06/21)  ?  IMPRESSION  ?  1. ?Normal size kidneys without hydronephrosis.  2. 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  3. ?Moderate bladder post void residual.       Assessment and Plan:    Problem   Lower Urinary Tract Symptoms (Luts)    Consult 05/27/17  #1 complaint - increased LUTS x 1 year  Chronic dysuria, Frequency every 30 minutes, urge and urge incontinence, occasionally wearing Depends 3-4 day, nocturia x 2  No trial of OAB medications    05/27/17   PVR 180 MLs, PVR #2 3cc       Lower urinary tract symptoms (LUTS)  56 year old male returns to clinic with his mother for ongoing evaluation of incomplete bladder emptying, urinary frequency. ?Patient has been taking Flomax 0.4 mg?BID?daily following his last appointment.  Patient is currently voiding with less difficulty since his last appointment, most likely improved due to changes in bowel regimen that has resulted in decreased episodes of constipation/severe constipation.  Is postvoid residual was 18 mL, greatly improved to previous result at 312 mL.   ?  Patient's mother like to continue to monitor symptoms at this time as his symptoms have improved.  Plan to follow-up in 12 months for further evaluation with postvoid residual, instructed patient's mother to call if he developed worsening urinary symptoms, episode of acute urinary retention.  May consider repeat upper tract imaging in the future if patient develops increased difficulties with urination, if additional findings from postvoid residual show elevation.    Orders Placed This Encounter   ? tamsulosin (FLOMAX) 0.4 mg capsule     Return in about 1 year (around 11/07/2022) for LUTS/PVR.      Abby Potash, APRN-NP  Department of Urology

## 2021-11-03 ENCOUNTER — Encounter: Admit: 2021-11-03 | Discharge: 2021-11-03 | Payer: MEDICARE

## 2021-11-06 ENCOUNTER — Encounter: Admit: 2021-11-06 | Discharge: 2021-11-06 | Payer: MEDICARE

## 2021-11-06 ENCOUNTER — Ambulatory Visit: Admit: 2021-11-06 | Discharge: 2021-11-07 | Payer: MEDICARE

## 2021-11-06 DIAGNOSIS — R51 Headache: Secondary | ICD-10-CM

## 2021-11-06 DIAGNOSIS — F840 Autistic disorder: Secondary | ICD-10-CM

## 2021-11-06 DIAGNOSIS — R12 Heartburn: Secondary | ICD-10-CM

## 2021-11-06 DIAGNOSIS — A048 Other specified bacterial intestinal infections: Secondary | ICD-10-CM

## 2021-11-06 DIAGNOSIS — Z79899 Other long term (current) drug therapy: Secondary | ICD-10-CM

## 2021-11-06 DIAGNOSIS — R399 Unspecified symptoms and signs involving the genitourinary system: Secondary | ICD-10-CM

## 2021-11-06 DIAGNOSIS — F79 Unspecified intellectual disabilities: Secondary | ICD-10-CM

## 2021-11-06 DIAGNOSIS — K5900 Constipation, unspecified: Secondary | ICD-10-CM

## 2021-11-06 DIAGNOSIS — R21 Rash and other nonspecific skin eruption: Secondary | ICD-10-CM

## 2021-11-06 DIAGNOSIS — R339 Retention of urine, unspecified: Secondary | ICD-10-CM

## 2021-11-06 DIAGNOSIS — E039 Hypothyroidism, unspecified: Secondary | ICD-10-CM

## 2021-11-06 DIAGNOSIS — Z9181 History of falling: Secondary | ICD-10-CM

## 2021-11-06 DIAGNOSIS — R55 Syncope and collapse: Secondary | ICD-10-CM

## 2021-11-06 DIAGNOSIS — K56609 Unspecified intestinal obstruction, unspecified as to partial versus complete obstruction: Secondary | ICD-10-CM

## 2021-11-06 DIAGNOSIS — D72819 Decreased white blood cell count, unspecified: Secondary | ICD-10-CM

## 2021-11-06 DIAGNOSIS — G249 Dystonia, unspecified: Secondary | ICD-10-CM

## 2021-11-06 MED ORDER — TAMSULOSIN 0.4 MG PO CAP
0.4 mg | ORAL_CAPSULE | Freq: Two times a day (BID) | ORAL | 11 refills | 90.00000 days | Status: AC
Start: 2021-11-06 — End: ?

## 2021-11-06 NOTE — Progress Notes
PVR= 18 ml

## 2021-11-10 ENCOUNTER — Encounter: Admit: 2021-11-10 | Discharge: 2021-11-10 | Payer: MEDICARE

## 2021-11-10 DIAGNOSIS — F250 Schizoaffective disorder, bipolar type: Secondary | ICD-10-CM

## 2021-11-10 MED ORDER — LITHIUM CARBONATE 150 MG PO CAP
ORAL_CAPSULE | ORAL | 3 refills | 90.00000 days | Status: AC
Start: 2021-11-10 — End: ?

## 2021-11-20 ENCOUNTER — Encounter: Admit: 2021-11-20 | Discharge: 2021-11-20 | Payer: MEDICARE

## 2021-11-21 MED FILL — CREON 24,000-76,000 -120,000 UNIT PO CPDR: 24000-76000 -120,000 unit | ORAL | 90 days supply | Qty: 1350 | Fill #3 | Status: AC

## 2021-12-11 ENCOUNTER — Encounter: Admit: 2021-12-11 | Discharge: 2021-12-11 | Payer: MEDICARE

## 2021-12-11 DIAGNOSIS — F250 Schizoaffective disorder, bipolar type: Secondary | ICD-10-CM

## 2021-12-11 MED ORDER — OLANZAPINE 5 MG PO TAB
5 mg | ORAL_TABLET | Freq: Every morning | ORAL | 2 refills
Start: 2021-12-11 — End: ?

## 2021-12-11 MED ORDER — PROPRANOLOL 10 MG PO TAB
10 mg | ORAL_TABLET | Freq: Two times a day (BID) | ORAL | 2 refills
Start: 2021-12-11 — End: ?

## 2021-12-11 MED ORDER — TEMAZEPAM 15 MG PO CAP
ORAL_CAPSULE | 2 refills
Start: 2021-12-11 — End: ?

## 2021-12-11 MED ORDER — TEMAZEPAM 30 MG PO CAP
ORAL_CAPSULE | 2 refills
Start: 2021-12-11 — End: ?

## 2021-12-11 MED ORDER — OLANZAPINE 20 MG PO TAB
ORAL_TABLET | 2 refills
Start: 2021-12-11 — End: ?

## 2021-12-11 MED ORDER — CLONAZEPAM 0.5 MG PO TAB
.5 mg | ORAL_TABLET | Freq: Every morning | ORAL | 2 refills
Start: 2021-12-11 — End: ?

## 2021-12-11 MED ORDER — CLONAZEPAM 1 MG PO TAB
1 mg | ORAL_TABLET | Freq: Every day | ORAL | 2 refills
Start: 2021-12-11 — End: ?

## 2021-12-13 ENCOUNTER — Encounter: Admit: 2021-12-13 | Discharge: 2021-12-13 | Payer: MEDICARE

## 2022-01-09 ENCOUNTER — Encounter: Admit: 2022-01-09 | Discharge: 2022-01-09 | Payer: MEDICARE

## 2022-01-09 ENCOUNTER — Ambulatory Visit: Admit: 2022-01-09 | Discharge: 2022-01-09 | Payer: MEDICARE

## 2022-01-09 DIAGNOSIS — K56609 Unspecified intestinal obstruction, unspecified as to partial versus complete obstruction: Secondary | ICD-10-CM

## 2022-01-09 DIAGNOSIS — A048 Other specified bacterial intestinal infections: Secondary | ICD-10-CM

## 2022-01-09 DIAGNOSIS — R21 Rash and other nonspecific skin eruption: Secondary | ICD-10-CM

## 2022-01-09 DIAGNOSIS — Z9181 History of falling: Secondary | ICD-10-CM

## 2022-01-09 DIAGNOSIS — D72819 Decreased white blood cell count, unspecified: Secondary | ICD-10-CM

## 2022-01-09 DIAGNOSIS — F79 Unspecified intellectual disabilities: Secondary | ICD-10-CM

## 2022-01-09 DIAGNOSIS — Z79899 Other long term (current) drug therapy: Secondary | ICD-10-CM

## 2022-01-09 DIAGNOSIS — K5900 Constipation, unspecified: Secondary | ICD-10-CM

## 2022-01-09 DIAGNOSIS — R339 Retention of urine, unspecified: Secondary | ICD-10-CM

## 2022-01-09 DIAGNOSIS — R1011 Right upper quadrant pain: Secondary | ICD-10-CM

## 2022-01-09 DIAGNOSIS — G249 Dystonia, unspecified: Secondary | ICD-10-CM

## 2022-01-09 DIAGNOSIS — K5904 Chronic idiopathic constipation: Secondary | ICD-10-CM

## 2022-01-09 DIAGNOSIS — R12 Heartburn: Secondary | ICD-10-CM

## 2022-01-09 DIAGNOSIS — F840 Autistic disorder: Secondary | ICD-10-CM

## 2022-01-09 DIAGNOSIS — R51 Headache: Secondary | ICD-10-CM

## 2022-01-09 DIAGNOSIS — R55 Syncope and collapse: Secondary | ICD-10-CM

## 2022-01-09 DIAGNOSIS — E039 Hypothyroidism, unspecified: Secondary | ICD-10-CM

## 2022-01-09 LAB — CBC AND DIFF
ABSOLUTE BASO COUNT: 0 K/UL (ref 0–0.20)
ABSOLUTE EOS COUNT: 0 K/UL (ref 0–0.45)
ABSOLUTE LYMPH COUNT: 1.1 K/UL (ref 1.0–4.8)
ABSOLUTE MONO COUNT: 0.3 K/UL (ref 0–0.80)
ABSOLUTE NEUTROPHIL: 3.1 K/UL (ref 1.8–7.0)
BASOPHILS %: 0 % (ref 0–2)
EOSINOPHILS %: 0 % (ref >60)
HEMATOCRIT: 44 % — ABNORMAL HIGH (ref 40–50)
HEMOGLOBIN: 14 g/dL (ref 13.5–16.5)
LYMPHOCYTES %: 25 % (ref 24–44)
MCH: 32 pg (ref 26–34)
MCHC: 33 g/dL (ref 32.0–36.0)
MCV: 97 FL (ref 80–100)
MONOCYTES %: 7 % (ref 4–12)
MPV: 6.9 FL — ABNORMAL LOW (ref 7–11)
NEUTROPHILS %: 68 % (ref 41–77)
PLATELET COUNT: 222 K/UL (ref 150–400)
RBC COUNT: 4.6 M/UL (ref 4.4–5.5)
RDW: 13 % (ref 11–15)
WBC COUNT: 4.7 K/UL (ref 4.5–11.0)

## 2022-01-09 LAB — COMPREHENSIVE METABOLIC PANEL
POTASSIUM: 4.7 MMOL/L (ref 3.5–5.1)
SODIUM: 144 MMOL/L (ref 137–147)

## 2022-01-09 MED ORDER — TRULANCE 3 MG PO TAB
1.5 mg | ORAL_TABLET | Freq: Every day | ORAL | 2 refills | Status: AC
Start: 2022-01-09 — End: ?
  Filled 2022-01-09: qty 30, 60d supply, fill #1

## 2022-01-09 MED ORDER — TAMSULOSIN 0.4 MG PO CAP
ORAL_CAPSULE | 11 refills
Start: 2022-01-09 — End: ?

## 2022-01-10 ENCOUNTER — Encounter: Admit: 2022-01-10 | Discharge: 2022-01-10 | Payer: MEDICARE

## 2022-01-12 ENCOUNTER — Ambulatory Visit: Admit: 2022-01-12 | Discharge: 2022-01-12 | Payer: MEDICARE

## 2022-01-12 ENCOUNTER — Ambulatory Visit: Admit: 2022-01-12 | Discharge: 2022-01-13 | Payer: MEDICARE

## 2022-01-12 ENCOUNTER — Encounter: Admit: 2022-01-12 | Discharge: 2022-01-12 | Payer: MEDICARE

## 2022-01-12 DIAGNOSIS — Z79899 Other long term (current) drug therapy: Secondary | ICD-10-CM

## 2022-01-12 DIAGNOSIS — F902 Attention-deficit hyperactivity disorder, combined type: Secondary | ICD-10-CM

## 2022-01-12 DIAGNOSIS — F840 Autistic disorder: Secondary | ICD-10-CM

## 2022-01-12 DIAGNOSIS — F250 Schizoaffective disorder, bipolar type: Secondary | ICD-10-CM

## 2022-01-12 LAB — LIPID PROFILE
CHOLESTEROL: 140 mg/dL (ref <200)
HDL: 60 mg/dL (ref >40)
LDL: 70 mg/dL (ref <100)
NON HDL CHOLESTEROL: 80 mg/dL
TRIGLYCERIDES: 62 mg/dL (ref <150)
VLDL: 12 mg/dL

## 2022-01-12 LAB — TSH WITH FREE T4 REFLEX: TSH: 4.8 uU/mL (ref 0.35–5.00)

## 2022-01-12 LAB — HEMOGLOBIN A1C: HEMOGLOBIN A1C: 5.4 % (ref 4.0–5.7)

## 2022-01-12 LAB — LITHIUM LEVEL: LITHIUM: 1 meq/L (ref 0.6–1.2)

## 2022-01-12 MED ORDER — OLANZAPINE 20 MG PO TBDI
20 mg | ORAL_TABLET | Freq: Every evening | ORAL | 3 refills | 30.00000 days | Status: AC
Start: 2022-01-12 — End: ?

## 2022-01-12 MED ORDER — OLANZAPINE 5 MG PO TBDI
ORAL_TABLET | ORAL | 3 refills | 30.00000 days | Status: AC
Start: 2022-01-12 — End: ?

## 2022-01-12 NOTE — Progress Notes
Outpatient Psychiatry Progress Note    Subjective:  Chad Randall is a 56 y.o. 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 pylori who presents for follow up appointment today. Chad Randall was last seen on 09/27/21 at which time no medication changes were made. Zyprexa 5mg  Qam and 20mg  QHS and Zydis 5mg  Qday PRN, Seroquel 400/400/600mg , Klonopin 0.5/1mg  QHS, Lithium 300mg  Qam and 450mg  QHS, Lamictal 150mg  BID, Restoril 45mg  QHS, and propranolol 10mg  BID were continued without change. Metabolic labs ordered, last completed 10/2019.    He is accompanied by his mother, Chad Randall, who provides history as patient is unable. He was extremely hyper for about 5-7 nights, with decreased energy and more grogginess for the past two weeks. During period of time with heightened energy, he could hardly sleep. He was hyperverbal and kind of agitated he wanted people to stay awake and talk to him all night long. Was exhibiting more flight of ideas he covered everything that he ever talked about. He is now groggy from 830pm last night. Sometimes the medication goes in and you see absolutely no results, sometimes he takes the medication and is groggy for hours. Does not appear to be depressed per mother and has not made any statements raising concern for SI/self harm. His bowel regimen has been changed, per GI bowel movements have improved drastically since addition of Trulance (half a tab daily as full tablet leads to diarrhea) along with 90 mcg of Linzess which mother agrees with.    Denies SI/HI/AVH.    Past Medication Trials:  Has been off Lithium in the past things got worse  Clozaril (neutropenia)  Depakote  Loxapine  Trileptal  Ritalin (for several years as a child)  Elavil (worse)  Strattera-arm dystonias  Flurazepam  Ambien  Melatonin   Wellbutrin   Tegretol  Neurontin  ?- Antipsychotic monotherapy ineffective in the past    Psychiatric history update:  -Inpatient admissions: First admission was at age 43.  Was later hospitalized in [facility] for 6 years from age [age].  Was last hospitalized at [facility] in May 2010. Last hospitalized in 2015.  -Patient has a history of aggressive behaviors  -History of lithium toxicity in 2009 [facility] admission  ?  Social History Update:  - He enjoys coloring and playing with toy cars  - Lives with mother, father, brother    Substance Use Hx Update:  -Tobacco: denies  -Alcohol: denies  -Illicit substances: denies    Review of Systems   Respiratory: Negative for shortness of breath.    Cardiovascular: Negative for chest pain.   Gastrointestinal: Positive for constipation. Negative for abdominal pain.   Neurological: Negative for headaches.   Psychiatric/Behavioral: Negative for hallucinations and suicidal ideas.     Objective:         ? aspirin EC 81 mg tablet Take one tablet by mouth daily. Take with food.   ? atorvastatin (LIPITOR) 20 mg tablet Take one tablet by mouth at bedtime daily.   ? bisacodyl (DULCOLAX) 5 mg tablet Take 1 Tab by mouth twice daily.   ? clonazePAM (KLONOPIN) 0.5 mg tablet TAKE ONE TABLET BY MOUTH EVERY MORNING   ? clonazePAM (KLONOPIN)  1 mg tablet TAKE ONE TABLET BY MOUTH DAILY   ? DOCOSAHEXANOIC ACID/EPA (FISH OIL PO) Take 2 Caps by mouth daily.   ? docusate (COLACE) 100 mg capsule Take one capsule by mouth twice daily.   ? doxycycline hyclate (VIBRACIN) 100 mg tablet Take one tablet by mouth twice daily.   ? glucosamine(+) 500 mg tab Take one tablet by mouth daily.   ? hydrocortisone (HYTONE) 2.5 % topical cream Apply  topically to affected area twice daily.   ? lamoTRIgine (LAMICTAL) 150 mg tablet Take one tablet by mouth twice daily.   ? levothyroxine (SYNTHROID) 100 mcg tablet Take one tablet by mouth daily.   ? lipase-protease-amylase (CREON) 24000-76000-120000 units capsule Take three capsules by mouth three times daily with meals AND two capsules with snacks.  Up to 3 snacks per day   ? lithium carbonate (ESKALITH) 150 mg capsule TAKE TWO CAPSULES BY MOUTH IN THE MORNING AND 3 CAPSULES AT BEDTIME WITH FOOD.   ? lubiprostone (AMITIZA) 24 mcg capsule TAKE ONE CAPSULE BY MOUTH TWICE DAILY WITH MEALS   ? magnesium citrate oral solution Take 296 mL by mouth as Needed.   ? magnesium four hundred mEq twice daily.   ? OLANZapine (ZYPREXA ZYDIS) 5 mg rapid dissolve tablet Dissolve one tablet by mouth daily as needed.   ? OLANZapine (ZYPREXA) 20 mg tablet TAKE ONE TABLET BY MOUTH EVERY NIGHT AT BEDTIME   ? OLANZapine (ZYPREXA) 5 mg tablet TAKE ONE TABLET BY MOUTH EVERY MORNING   ? peg-electrolyte solution (NULYTELY LEMON-LIME) 420 gram oral solution Mix as directed on container. Drink slowly over 24 hrs. Keep cold. Eat and drink as normally. No restrictions.May complete once a month as needed.   ? plecanatide (TRULANCE) 3 mg tablet Take one-half tablet by mouth daily.   ? polyethylene glycol 3350 (GLYCOLAX; MIRALAX) 17 gram/dose powder Take seventeen g by mouth twice daily.   ? pramoxine-hydrocortisone (ANALPRAM-HC) 1-1 % rectal cream Insert or Apply one g to rectal area as directed twice daily.   ? propranoloL (INDERAL) 10 mg tablet TAKE ONE TABLET BY MOUTH TWICE DAILY   ? QUEtiapine (SEROQUEL) 100 mg tablet TAKE ONE TABLET BY MOUTH TWICE DAILY (morning AND 2pm) along with 300mg  tab FOR daily DOSE of 400mg    ? QUEtiapine (SEROQUEL) 300 mg tablet TAKE ONE TABLET BY MOUTH TWICE DAILY (morning AND 2pm along with 100mg  tab) AND TWO tabs EVERY NIGHT AT BEDTIME   ? senna (SENOKOT) 8.6 mg tablet Take two tablets by mouth twice daily.   ? tamsulosin (FLOMAX) 0.4 mg capsule TAKE ONE CAPSULE BY MOUTH TWICE DAILY * TAKE 30 MINUTES AFTER SAME MEAL*   ? temazepam (RESTORIL) 15 mg capsule TAKE ONE CAPSULE BY MOUTH EVERY NIGHT AT BEDTIME IN ADDITION TO 30MG  FOR A TOTAL OF 45MG  AT BEDTIME   ? temazepam (RESTORIL) 30 mg capsule TAKE ONE CAPSULE BY MOUTH AT BEDTIME     Vitals:    01/12/22 0755   BP: (!) 143/92   BP Source: Arm, Right Upper   Pulse: 70   Weight: 62.6 kg (138 lb)   Height: 167.6 cm (5' 6)     Body mass index is 22.27 kg/m?Marland Kitchen     Physical Exam  Psychiatric:      Comments: MENTAL STATUS EXAMINATION  General/Constitutional: appears stated age, dressed in personal clothes, fair grooming  Eye Contact: minimal  Behavior: Excited, aloof  Speech: RRR with normal volume and tone. Fair articulation  Mood: ok  Affect: euthymic ;  mood congruent  Thought Process: circumstantial  Thought Content: denies SI, HI. No evidence of delusions  Perception: Denies AVH  Associations: Intact  Insight/Judgment: Limited       Metabolic monitoring:  Metabolic monitoring:     There is no height or weight on file to calculate BMI.  Wt Readings from Last 3 Encounters:   01/09/22 62.6 kg (138 lb)   11/06/21 59.9 kg (132 lb)   09/27/21 60.7 kg (133 lb 12.8 oz)     BP Readings from Last 3 Encounters:   01/09/22 118/80   11/06/21 129/82   09/27/21 117/76     Lab Results   Component Value Date    CHOL 140 01/12/2022    TRIG 62 01/12/2022    HDL 60 01/12/2022    LDL 70 01/12/2022    VLDL 12 01/12/2022    NONHDLCHOL 80 01/12/2022    CHOLHDLC 3 08/02/2016     Hemoglobin A1C   Date Value Ref Range Status   07/02/2018 5.3 4.0 - 6.0 % Final     Comment:     The ADA recommends that most patients with type 1 and type 2 diabetes maintain   an A1c level <7%.         AIMS  No data recorded    Assessment and Plan:  Chad Randall. is a 56 y.o. male with the below diagnoses. Pt had period of manic episode despite significant medication regimen (including olanzapine, quetiapine, lithium, lamotrigine, benzos, and propranolol).  Manic symptoms have resolved. There is some concern that GI motility may be contributing to changes in medication levels.  To avoid this, we will change Zyprexa from p.o. tablet to oral disintegrating tablets for all doses, morning, evening and as needed.  Patient would continue to benefit from deprescribing as possible due to polypharmacy at this time.  No safety concerns at this time.    1. Schizoaffective disorder, bipolar type (HCC)    2. ADHD (attention deficit hyperactivity disorder), combined type    3. Autism      Plan:  -Change olanzapine 5mg  qam, 20mg  qhs, and 5mg  ODT prn agitation to all olanzapine ODT (5mg  qam, 20mg  qhs, 5mg  prn agitation) due to concerns that GI motility could be causing inconsistent medication levels.  -No medication changes today, continue all current medications as follows:   - Olanzapine 5 mg every morning and 20 mg nightly for mood and agitation;    - Zydis 5 mg daily as needed for agitation, using twice a week   - Quetiapine 400/400/600 mg daily for mood and agitation   - Clonazepam 0.5 mg every morning and 1 mg nightly for anxiety   - Temazepam 45 mg nightly for sleep   - Lithium 300 mg every morning and 450 mg nightly for mood and agitation   - Lamotrigine 150 mg twice daily for mood and agitation   - Propranolol 10 mg twice daily for akathisia  -Monitoring labs obtained today: TSH, lipid panel, hemoglobin A1c, lithium level.    - 6/23- lipid panel WNL, TSH wnl, lithium level 1.0 (WNL)   - A1c pending  -Most recent EKG: 10/11/2020; QTC 470  -Aims 11/11/2020: 1 for tongue fasciculations (unchanged from previous)    Discussion  1. The proposed treatment plan was discussed with the patient who was provided the opportunity to actively take part finalizing the current treatment plan.   ? Discussed risks, benefits and potential side effects of medications with patient and guardian as  well as alternative treatments  ? Discussed relevant black box warnings   ? patient reported understanding of the risks, benefits and possible side effects and provided consent for treatment unless otherwise stated  2. Discussed contingency/emergency plans with the patient should there be concern for attempting suicide, committing self-harm or other potential injuries to self or others  1. These plans include:  ? Contacting the mental health clinic (calling, walk-in, etc.)  ? Calling 911 or calling the crisis line  ? Visiting the ER at any time    Return to clinic in 3 months    Patient discussed with Dr. Hilma Favors    Safety plan discussed at length and outlined in detail on after visit summary    The proposed treatment plan was discussed with the patient/guardian who was provided the opportunity to ask questions and make suggestions regarding alternative treatment.

## 2022-01-12 NOTE — Progress Notes
ATTENDING NOTE      Encounter Date: 01/12/2022    I have reviewed the case with Dr. [name], MD at the time of the visit and I saw and evaluated Chad Randall..  This was an in person visit.     HISTORY: Patient is 56 y.o. male followed in this clinic for Schizoaffective disorder, ADHGD, Autism spectrum disorder, prader willi and mild intellectual disability.    CURRENT TREATMENT: olanzapine 5 mg in the morning and 20 mg at bedtime.  Zydis 5 mg daily PRN.  Quetiapine 400mg  every morning /400mg  midday, and 600mg  at bedtime.  Clonzepam 0.5 mg every morning and 1 mg at bedtime.  Temazepam 45 mg at bedtime.  Lithium 300 mg in the morning and 450 mg at bedtime.  Lamotrigine 150 mg twice a day.  Propranolol 10 mg twice a day.     KEY DEVELOPMENTS AND FINDINGS: Patient was seen with his mother who provided information.  The patient has had a brief episode of hypomania but within the past two weeks is now returning to baseline. He required more PRN Zydis.  He has been otherwise stable. The patient says taht he is happy to see me.  He is friendly and responsive.  Intellectual disability is noted.  Patient seems comfortable.  Affect is appropriate.     IMPRESSION & PLAN: Continue current medications.  After discussion about fluctuating possibility of absorption, Dr. suggested a trial of more Zydis for regular dosing of the olanzapine.  This may give more uniform coverage.  I noted to mother that there may always be mild mood fluctuations for this patient and that to try to completely suppress this would potentially other consequences.    Except as noted here, I agree with the evaluation and plan as formulated with Dr. , MD

## 2022-01-13 ENCOUNTER — Encounter: Admit: 2022-01-13 | Discharge: 2022-01-13 | Payer: MEDICARE

## 2022-01-13 MED FILL — TRULANCE 3 MG PO TAB: 3 mg | ORAL | 60 days supply | Qty: 30 | Fill #1 | Status: AC

## 2022-02-14 ENCOUNTER — Encounter: Admit: 2022-02-14 | Discharge: 2022-02-14 | Payer: MEDICARE

## 2022-02-28 ENCOUNTER — Encounter: Admit: 2022-02-28 | Discharge: 2022-02-28 | Payer: MEDICARE

## 2022-02-28 DIAGNOSIS — F250 Schizoaffective disorder, bipolar type: Secondary | ICD-10-CM

## 2022-02-28 MED ORDER — LITHIUM CARBONATE 150 MG PO CAP
ORAL_CAPSULE | 3 refills
Start: 2022-02-28 — End: ?

## 2022-02-28 MED ORDER — PROPRANOLOL 10 MG PO TAB
10 mg | ORAL_TABLET | Freq: Two times a day (BID) | ORAL | 3 refills
Start: 2022-02-28 — End: ?

## 2022-03-01 ENCOUNTER — Encounter: Admit: 2022-03-01 | Discharge: 2022-03-01 | Payer: MEDICARE

## 2022-03-01 DIAGNOSIS — F250 Schizoaffective disorder, bipolar type: Secondary | ICD-10-CM

## 2022-03-01 MED ORDER — TEMAZEPAM 30 MG PO CAP
ORAL_CAPSULE | 2 refills | Status: AC
Start: 2022-03-01 — End: ?

## 2022-03-01 MED ORDER — CLONAZEPAM 1 MG PO TAB
1 mg | ORAL_TABLET | Freq: Every day | ORAL | 2 refills | Status: AC
Start: 2022-03-01 — End: ?

## 2022-03-01 MED ORDER — CLONAZEPAM 0.5 MG PO TAB
.5 mg | ORAL_TABLET | Freq: Every morning | ORAL | 2 refills | Status: AC
Start: 2022-03-01 — End: ?

## 2022-03-01 MED ORDER — TEMAZEPAM 15 MG PO CAP
ORAL_CAPSULE | 2 refills | Status: AC
Start: 2022-03-01 — End: ?

## 2022-03-01 NOTE — Telephone Encounter
Chart reviewed. Per most recent visit on 01/12/22, plan included Clonazepam 0.5 mg every morning and 1 mg nightly for anxiety and Temazepam 45 mg nightly for sleep.    PDMP reviewed. Temazepam 30 Mg Capsule, Temazepam 15 Mg Capsule, Clonazepam 1 Mg Tablet, all #30/30 last filled 02/09/22.     Per PDMP, Clonazepam 0.5 Mg Tablet #30/30 last filled 01/10/22.     Has upcoming visit 05/04/22.

## 2022-03-09 ENCOUNTER — Encounter: Admit: 2022-03-09 | Discharge: 2022-03-09 | Payer: MEDICARE

## 2022-03-09 MED FILL — CREON 24,000-76,000 -120,000 UNIT PO CPDR: 24000-76000 -120,000 unit | ORAL | 90 days supply | Qty: 1350 | Fill #4 | Status: AC

## 2022-03-22 ENCOUNTER — Encounter: Admit: 2022-03-22 | Discharge: 2022-03-22 | Payer: MEDICARE

## 2022-03-22 ENCOUNTER — Ambulatory Visit: Admit: 2022-03-22 | Discharge: 2022-03-22 | Payer: MEDICARE

## 2022-03-22 DIAGNOSIS — R1011 Right upper quadrant pain: Secondary | ICD-10-CM

## 2022-03-22 DIAGNOSIS — K5904 Chronic idiopathic constipation: Secondary | ICD-10-CM

## 2022-03-29 ENCOUNTER — Encounter: Admit: 2022-03-29 | Discharge: 2022-03-29 | Payer: MEDICARE

## 2022-03-29 DIAGNOSIS — F250 Schizoaffective disorder, bipolar type: Secondary | ICD-10-CM

## 2022-03-29 MED ORDER — OLANZAPINE 5 MG PO TAB
5 mg | ORAL_TABLET | Freq: Every morning | ORAL | 2 refills
Start: 2022-03-29 — End: ?

## 2022-03-29 MED ORDER — OLANZAPINE 20 MG PO TAB
ORAL_TABLET | 2 refills
Start: 2022-03-29 — End: ?

## 2022-03-30 ENCOUNTER — Encounter: Admit: 2022-03-30 | Discharge: 2022-03-30 | Payer: MEDICARE

## 2022-03-30 DIAGNOSIS — F250 Schizoaffective disorder, bipolar type: Secondary | ICD-10-CM

## 2022-03-30 MED ORDER — LAMOTRIGINE 150 MG PO TAB
150 mg | ORAL_TABLET | Freq: Two times a day (BID) | ORAL | 1 refills | Status: AC
Start: 2022-03-30 — End: ?

## 2022-03-31 NOTE — Telephone Encounter
Call placed to legal guardian Kathie Rhodes at number in chart [phone] to confirm patient is taking lamictal at current dose before placing refill order.     She confirms he has been taking lamictal 150mg  BID every day.     Provided reminder for upcoming appointment on 10/6.

## 2022-04-13 ENCOUNTER — Encounter: Admit: 2022-04-13 | Discharge: 2022-04-13 | Payer: MEDICARE

## 2022-04-14 ENCOUNTER — Encounter: Admit: 2022-04-14 | Discharge: 2022-04-14 | Payer: MEDICARE

## 2022-04-14 DIAGNOSIS — F250 Schizoaffective disorder, bipolar type: Secondary | ICD-10-CM

## 2022-04-14 MED ORDER — OLANZAPINE 5 MG PO TAB
5 mg | ORAL_TABLET | Freq: Every morning | ORAL | 2 refills
Start: 2022-04-14 — End: ?

## 2022-04-14 MED ORDER — OLANZAPINE 20 MG PO TAB
ORAL_TABLET | 2 refills
Start: 2022-04-14 — End: ?

## 2022-04-21 ENCOUNTER — Encounter: Admit: 2022-04-21 | Discharge: 2022-04-21 | Payer: MEDICARE

## 2022-04-22 ENCOUNTER — Encounter: Admit: 2022-04-22 | Discharge: 2022-04-22 | Payer: MEDICARE

## 2022-04-22 MED FILL — TRULANCE 3 MG PO TAB: 3 mg | ORAL | 60 days supply | Qty: 30 | Fill #2 | Status: AC

## 2022-05-08 ENCOUNTER — Encounter: Admit: 2022-05-08 | Discharge: 2022-05-08 | Payer: MEDICARE

## 2022-05-09 ENCOUNTER — Encounter: Admit: 2022-05-09 | Discharge: 2022-05-09 | Payer: MEDICARE

## 2022-05-11 ENCOUNTER — Encounter: Admit: 2022-05-11 | Discharge: 2022-05-11 | Payer: MEDICARE

## 2022-05-22 ENCOUNTER — Encounter: Admit: 2022-05-22 | Discharge: 2022-05-22 | Payer: MEDICARE

## 2022-05-31 ENCOUNTER — Encounter: Admit: 2022-05-31 | Discharge: 2022-05-31 | Payer: MEDICARE

## 2022-05-31 DIAGNOSIS — F250 Schizoaffective disorder, bipolar type: Secondary | ICD-10-CM

## 2022-05-31 MED ORDER — TEMAZEPAM 15 MG PO CAP
ORAL_CAPSULE | 2 refills
Start: 2022-05-31 — End: ?

## 2022-05-31 MED ORDER — TEMAZEPAM 30 MG PO CAP
ORAL_CAPSULE | 2 refills
Start: 2022-05-31 — End: ?

## 2022-05-31 MED ORDER — CLONAZEPAM 0.5 MG PO TAB
.5 mg | ORAL_TABLET | Freq: Every morning | ORAL | 2 refills
Start: 2022-05-31 — End: ?

## 2022-05-31 MED ORDER — CLONAZEPAM 1 MG PO TAB
1 mg | ORAL_TABLET | Freq: Every day | ORAL | 2 refills
Start: 2022-05-31 — End: ?

## 2022-06-05 ENCOUNTER — Encounter: Admit: 2022-06-05 | Discharge: 2022-06-05 | Payer: MEDICARE

## 2022-06-05 DIAGNOSIS — K5904 Chronic idiopathic constipation: Secondary | ICD-10-CM

## 2022-06-05 DIAGNOSIS — R109 Unspecified abdominal pain: Secondary | ICD-10-CM

## 2022-06-05 MED ORDER — CREON 24,000-76,000 -120,000 UNIT PO CPDR
ORAL_CAPSULE | ORAL | 3 refills
Start: 2022-06-05 — End: ?

## 2022-06-07 ENCOUNTER — Encounter: Admit: 2022-06-07 | Discharge: 2022-06-07 | Payer: MEDICARE

## 2022-06-07 MED FILL — CREON 24,000-76,000 -120,000 UNIT PO CPDR: 24000-76000 -120,000 unit | ORAL | 90 days supply | Qty: 1350 | Fill #1 | Status: AC

## 2022-06-08 ENCOUNTER — Encounter: Admit: 2022-06-08 | Discharge: 2022-06-08 | Payer: MEDICARE

## 2022-06-08 DIAGNOSIS — F250 Schizoaffective disorder, bipolar type: Secondary | ICD-10-CM

## 2022-06-08 MED ORDER — QUETIAPINE 100 MG PO TAB
ORAL_TABLET | 4 refills | Status: AC
Start: 2022-06-08 — End: ?

## 2022-06-08 MED ORDER — QUETIAPINE 300 MG PO TAB
ORAL_TABLET | 4 refills | Status: AC
Start: 2022-06-08 — End: ?

## 2022-06-14 ENCOUNTER — Encounter: Admit: 2022-06-14 | Discharge: 2022-06-14 | Payer: MEDICARE

## 2022-07-04 ENCOUNTER — Encounter: Admit: 2022-07-04 | Discharge: 2022-07-04 | Payer: MEDICARE

## 2022-07-05 ENCOUNTER — Encounter: Admit: 2022-07-05 | Discharge: 2022-07-05 | Payer: MEDICARE

## 2022-07-05 DIAGNOSIS — F250 Schizoaffective disorder, bipolar type: Secondary | ICD-10-CM

## 2022-07-05 MED ORDER — OLANZAPINE 5 MG PO TBDI
ORAL_TABLET | 3 refills
Start: 2022-07-05 — End: ?

## 2022-07-05 MED ORDER — OLANZAPINE 5 MG PO TBDI
5 mg | ORAL_TABLET | Freq: Every evening | ORAL | 0 refills
Start: 2022-07-05 — End: ?

## 2022-07-05 MED ORDER — OLANZAPINE 5 MG PO TAB
5 mg | ORAL_TABLET | Freq: Every morning | ORAL | 2 refills | 30.00000 days | Status: AC
Start: 2022-07-05 — End: ?

## 2022-07-06 ENCOUNTER — Encounter: Admit: 2022-07-06 | Discharge: 2022-07-06 | Payer: MEDICARE

## 2022-07-06 DIAGNOSIS — F250 Schizoaffective disorder, bipolar type: Secondary | ICD-10-CM

## 2022-07-06 MED ORDER — OLANZAPINE 5 MG PO TBDI
ORAL_TABLET | ORAL | 3 refills | 30.00000 days | Status: AC
Start: 2022-07-06 — End: ?

## 2022-07-06 MED FILL — TRULANCE 3 MG PO TAB: 3 mg | ORAL | 60 days supply | Qty: 30 | Fill #3 | Status: AC

## 2022-07-09 ENCOUNTER — Encounter: Admit: 2022-07-09 | Discharge: 2022-07-09 | Payer: MEDICARE

## 2022-07-09 DIAGNOSIS — K5900 Constipation, unspecified: Secondary | ICD-10-CM

## 2022-07-09 DIAGNOSIS — F250 Schizoaffective disorder, bipolar type: Secondary | ICD-10-CM

## 2022-07-09 DIAGNOSIS — K5904 Chronic idiopathic constipation: Secondary | ICD-10-CM

## 2022-07-09 MED ORDER — LUBIPROSTONE 24 MCG PO CAP
24 ug | ORAL_CAPSULE | Freq: Two times a day (BID) | ORAL | 3 refills | 90.00000 days | Status: AC
Start: 2022-07-09 — End: ?

## 2022-07-09 MED ORDER — OLANZAPINE 5 MG PO TAB
5 mg | ORAL_TABLET | Freq: Every morning | ORAL | 3 refills
Start: 2022-07-09 — End: ?

## 2022-07-09 MED ORDER — LITHIUM CARBONATE 150 MG PO CAP
ORAL_CAPSULE | ORAL | 3 refills | 90.00000 days | Status: AC
Start: 2022-07-09 — End: ?

## 2022-07-09 MED ORDER — OLANZAPINE 20 MG PO TAB
ORAL_TABLET | 3 refills
Start: 2022-07-09 — End: ?

## 2022-07-09 MED ORDER — PROPRANOLOL 10 MG PO TAB
10 mg | ORAL_TABLET | Freq: Two times a day (BID) | ORAL | 3 refills | Status: AC
Start: 2022-07-09 — End: ?

## 2022-07-12 ENCOUNTER — Encounter: Admit: 2022-07-12 | Discharge: 2022-07-12 | Payer: MEDICARE

## 2022-07-12 DIAGNOSIS — F250 Schizoaffective disorder, bipolar type: Secondary | ICD-10-CM

## 2022-07-12 MED ORDER — OLANZAPINE 20 MG PO TAB
ORAL_TABLET | ORAL | 2 refills | 30.00000 days | Status: AC
Start: 2022-07-12 — End: ?

## 2022-08-27 ENCOUNTER — Encounter: Admit: 2022-08-27 | Discharge: 2022-08-27 | Payer: MEDICARE

## 2022-08-27 DIAGNOSIS — F250 Schizoaffective disorder, bipolar type: Secondary | ICD-10-CM

## 2022-08-27 MED ORDER — TEMAZEPAM 15 MG PO CAP
ORAL_CAPSULE | 2 refills
Start: 2022-08-27 — End: ?

## 2022-08-27 MED ORDER — CLONAZEPAM 1 MG PO TAB
1 mg | ORAL_TABLET | Freq: Every day | ORAL | 2 refills
Start: 2022-08-27 — End: ?

## 2022-08-27 MED ORDER — CLONAZEPAM 0.5 MG PO TAB
.5 mg | ORAL_TABLET | Freq: Every morning | ORAL | 2 refills
Start: 2022-08-27 — End: ?

## 2022-08-27 MED ORDER — TEMAZEPAM 30 MG PO CAP
ORAL_CAPSULE | 2 refills
Start: 2022-08-27 — End: ?

## 2022-09-06 ENCOUNTER — Encounter: Admit: 2022-09-06 | Discharge: 2022-09-06 | Payer: MEDICARE

## 2022-09-06 DIAGNOSIS — F250 Schizoaffective disorder, bipolar type: Secondary | ICD-10-CM

## 2022-09-06 MED ORDER — CLONAZEPAM 0.5 MG PO TAB
.5 mg | ORAL_TABLET | Freq: Every morning | ORAL | 2 refills
Start: 2022-09-06 — End: ?

## 2022-09-06 MED ORDER — CLONAZEPAM 1 MG PO TAB
1 mg | ORAL_TABLET | Freq: Every day | ORAL | 2 refills
Start: 2022-09-06 — End: ?

## 2022-09-06 MED ORDER — TEMAZEPAM 15 MG PO CAP
ORAL_CAPSULE | 2 refills
Start: 2022-09-06 — End: ?

## 2022-09-06 MED ORDER — TEMAZEPAM 30 MG PO CAP
ORAL_CAPSULE | 2 refills
Start: 2022-09-06 — End: ?

## 2022-09-10 ENCOUNTER — Encounter: Admit: 2022-09-10 | Discharge: 2022-09-10 | Payer: MEDICARE

## 2022-09-10 DIAGNOSIS — F250 Schizoaffective disorder, bipolar type: Secondary | ICD-10-CM

## 2022-09-10 MED ORDER — CLONAZEPAM 0.5 MG PO TAB
.5 mg | ORAL_TABLET | Freq: Every morning | ORAL | 2 refills
Start: 2022-09-10 — End: ?

## 2022-09-10 MED ORDER — CLONAZEPAM 1 MG PO TAB
1 mg | ORAL_TABLET | Freq: Every day | ORAL | 2 refills
Start: 2022-09-10 — End: ?

## 2022-09-10 MED ORDER — TEMAZEPAM 15 MG PO CAP
ORAL_CAPSULE | 2 refills
Start: 2022-09-10 — End: ?

## 2022-09-10 MED ORDER — TEMAZEPAM 30 MG PO CAP
ORAL_CAPSULE | 2 refills
Start: 2022-09-10 — End: ?

## 2022-09-14 ENCOUNTER — Encounter: Admit: 2022-09-14 | Discharge: 2022-09-14 | Payer: MEDICARE

## 2022-09-14 DIAGNOSIS — F250 Schizoaffective disorder, bipolar type: Secondary | ICD-10-CM

## 2022-09-14 MED ORDER — TEMAZEPAM 30 MG PO CAP
ORAL_CAPSULE | 2 refills | Status: AC
Start: 2022-09-14 — End: ?

## 2022-09-14 MED ORDER — CLONAZEPAM 1 MG PO TAB
1 mg | ORAL_TABLET | Freq: Every day | ORAL | 2 refills | Status: AC
Start: 2022-09-14 — End: ?

## 2022-09-14 MED ORDER — TEMAZEPAM 15 MG PO CAP
ORAL_CAPSULE | 2 refills | Status: AC
Start: 2022-09-14 — End: ?

## 2022-09-14 MED ORDER — CLONAZEPAM 0.5 MG PO TAB
.5 mg | ORAL_TABLET | Freq: Every morning | ORAL | 2 refills | Status: AC
Start: 2022-09-14 — End: ?

## 2022-09-14 NOTE — Telephone Encounter
Pharmacy LVM to follow up on refill. Per pharmacy patient didn't have medication last night and that patient didn't have a goodnight

## 2022-09-29 ENCOUNTER — Encounter: Admit: 2022-09-29 | Discharge: 2022-09-29 | Payer: MEDICARE

## 2022-09-29 DIAGNOSIS — K5904 Chronic idiopathic constipation: Secondary | ICD-10-CM

## 2022-09-29 DIAGNOSIS — K5900 Constipation, unspecified: Secondary | ICD-10-CM

## 2022-09-29 MED ORDER — TRULANCE 3 MG PO TAB
1.5 mg | ORAL_TABLET | Freq: Every day | ORAL | 2 refills
Start: 2022-09-29 — End: ?

## 2022-10-03 ENCOUNTER — Encounter: Admit: 2022-10-03 | Discharge: 2022-10-03 | Payer: MEDICARE

## 2022-10-04 ENCOUNTER — Encounter: Admit: 2022-10-04 | Discharge: 2022-10-04 | Payer: MEDICARE

## 2022-10-04 MED FILL — TRULANCE 3 MG PO TAB: 3 mg | ORAL | 60 days supply | Qty: 30 | Fill #1 | Status: AC

## 2022-10-06 ENCOUNTER — Encounter: Admit: 2022-10-06 | Discharge: 2022-10-06 | Payer: MEDICARE

## 2022-10-06 DIAGNOSIS — F250 Schizoaffective disorder, bipolar type: Secondary | ICD-10-CM

## 2022-10-06 MED ORDER — OLANZAPINE 20 MG PO TAB
ORAL_TABLET | 2 refills
Start: 2022-10-06 — End: ?

## 2022-10-06 MED ORDER — OLANZAPINE 5 MG PO TAB
5 mg | ORAL_TABLET | Freq: Every morning | ORAL | 2 refills
Start: 2022-10-06 — End: ?

## 2022-10-06 MED ORDER — LAMOTRIGINE 150 MG PO TAB
150 mg | ORAL_TABLET | Freq: Two times a day (BID) | ORAL | 2 refills
Start: 2022-10-06 — End: ?

## 2022-10-26 ENCOUNTER — Encounter: Admit: 2022-10-26 | Discharge: 2022-10-26 | Payer: MEDICARE

## 2022-10-30 ENCOUNTER — Encounter: Admit: 2022-10-30 | Discharge: 2022-10-30 | Payer: MEDICARE

## 2022-10-30 DIAGNOSIS — F250 Schizoaffective disorder, bipolar type: Secondary | ICD-10-CM

## 2022-10-30 MED ORDER — OLANZAPINE 5 MG PO TBDI
5 mg | ORAL_TABLET | Freq: Every day | ORAL | 2 refills | 30.00000 days | Status: AC | PRN
Start: 2022-10-30 — End: ?

## 2022-11-05 ENCOUNTER — Encounter: Admit: 2022-11-05 | Discharge: 2022-11-05 | Payer: MEDICARE

## 2022-11-05 DIAGNOSIS — F250 Schizoaffective disorder, bipolar type: Secondary | ICD-10-CM

## 2022-11-05 MED ORDER — LITHIUM CARBONATE 150 MG PO CAP
ORAL_CAPSULE | 3 refills
Start: 2022-11-05 — End: ?

## 2022-11-05 MED ORDER — PROPRANOLOL 10 MG PO TAB
10 mg | ORAL_TABLET | Freq: Two times a day (BID) | ORAL | 3 refills
Start: 2022-11-05 — End: ?

## 2022-11-12 ENCOUNTER — Encounter: Admit: 2022-11-12 | Discharge: 2022-11-12 | Payer: MEDICARE

## 2022-11-23 ENCOUNTER — Encounter: Admit: 2022-11-23 | Discharge: 2022-11-23 | Payer: MEDICARE

## 2022-11-24 ENCOUNTER — Encounter: Admit: 2022-11-24 | Discharge: 2022-11-24 | Payer: MEDICARE

## 2022-11-25 ENCOUNTER — Encounter: Admit: 2022-11-25 | Discharge: 2022-11-25 | Payer: MEDICARE

## 2022-11-25 DIAGNOSIS — F250 Schizoaffective disorder, bipolar type: Secondary | ICD-10-CM

## 2022-11-25 MED ORDER — TEMAZEPAM 30 MG PO CAP
ORAL_CAPSULE | 2 refills
Start: 2022-11-25 — End: ?

## 2022-11-25 MED ORDER — CLONAZEPAM 0.5 MG PO TAB
.5 mg | ORAL_TABLET | Freq: Every morning | ORAL | 2 refills
Start: 2022-11-25 — End: ?

## 2022-11-25 MED ORDER — TEMAZEPAM 15 MG PO CAP
ORAL_CAPSULE | 2 refills
Start: 2022-11-25 — End: ?

## 2022-11-25 MED ORDER — CLONAZEPAM 1 MG PO TAB
1 mg | ORAL_TABLET | Freq: Every day | ORAL | 2 refills
Start: 2022-11-25 — End: ?

## 2022-11-27 ENCOUNTER — Encounter: Admit: 2022-11-27 | Discharge: 2022-11-27 | Payer: MEDICARE

## 2022-11-27 MED FILL — CREON 24,000-76,000 -120,000 UNIT PO CPDR: 24000-76000 -120,000 unit | ORAL | 93 days supply | Qty: 1400 | Fill #2 | Status: AC

## 2022-11-27 MED FILL — TRULANCE 3 MG PO TAB: 3 mg | ORAL | 60 days supply | Qty: 30 | Fill #2 | Status: AC

## 2022-12-25 ENCOUNTER — Encounter: Admit: 2022-12-25 | Discharge: 2022-12-25 | Payer: MEDICARE

## 2023-01-04 ENCOUNTER — Encounter: Admit: 2023-01-04 | Discharge: 2023-01-04 | Payer: MEDICARE

## 2023-01-04 DIAGNOSIS — F250 Schizoaffective disorder, bipolar type: Secondary | ICD-10-CM

## 2023-01-04 MED ORDER — LITHIUM CARBONATE 150 MG PO CAP
ORAL_CAPSULE | 1 refills
Start: 2023-01-04 — End: ?

## 2023-01-04 MED ORDER — OLANZAPINE 5 MG PO TAB
5 mg | ORAL_TABLET | Freq: Every morning | ORAL | 1 refills
Start: 2023-01-04 — End: ?

## 2023-01-04 MED ORDER — OLANZAPINE 20 MG PO TAB
20 mg | ORAL_TABLET | Freq: Every evening | ORAL | 1 refills
Start: 2023-01-04 — End: ?

## 2023-01-04 MED ORDER — PROPRANOLOL 10 MG PO TAB
ORAL_TABLET | 1 refills
Start: 2023-01-04 — End: ?

## 2023-01-10 ENCOUNTER — Encounter: Admit: 2023-01-10 | Discharge: 2023-01-10 | Payer: MEDICARE

## 2023-01-14 ENCOUNTER — Encounter: Admit: 2023-01-14 | Discharge: 2023-01-14 | Payer: MEDICARE

## 2023-01-14 DIAGNOSIS — F250 Schizoaffective disorder, bipolar type: Secondary | ICD-10-CM

## 2023-01-14 DIAGNOSIS — Z79899 Other long term (current) drug therapy: Secondary | ICD-10-CM

## 2023-01-14 MED ORDER — OLANZAPINE 5 MG PO TAB
5 mg | ORAL_TABLET | Freq: Every morning | ORAL | 1 refills | 30.00000 days | Status: AC
Start: 2023-01-14 — End: ?

## 2023-01-14 MED ORDER — OLANZAPINE 20 MG PO TAB
20 mg | ORAL_TABLET | Freq: Every evening | ORAL | 1 refills | 30.00000 days | Status: AC
Start: 2023-01-14 — End: ?

## 2023-01-14 MED ORDER — PROPRANOLOL 10 MG PO TAB
ORAL_TABLET | 1 refills | Status: AC
Start: 2023-01-14 — End: ?

## 2023-01-14 MED ORDER — LITHIUM CARBONATE 150 MG PO CAP
ORAL_CAPSULE | ORAL | 1 refills | 90.00000 days | Status: AC
Start: 2023-01-14 — End: ?

## 2023-01-18 ENCOUNTER — Encounter: Admit: 2023-01-18 | Discharge: 2023-01-18 | Payer: MEDICARE

## 2023-01-18 ENCOUNTER — Ambulatory Visit: Admit: 2023-01-18 | Discharge: 2023-01-18 | Payer: MEDICARE

## 2023-01-18 DIAGNOSIS — R1310 Dysphagia, unspecified: Secondary | ICD-10-CM

## 2023-01-18 DIAGNOSIS — R109 Unspecified abdominal pain: Secondary | ICD-10-CM

## 2023-01-18 MED ORDER — IOHEXOL 350 MG IODINE/ML IV SOLN
80 mL | Freq: Once | INTRAVENOUS | 0 refills | Status: CP
Start: 2023-01-18 — End: ?
  Administered 2023-01-18: 14:00:00 80 mL via INTRAVENOUS

## 2023-01-18 MED ORDER — SODIUM CHLORIDE 0.9 % IJ SOLN
50 mL | Freq: Once | INTRAVENOUS | 0 refills | Status: CP
Start: 2023-01-18 — End: ?
  Administered 2023-01-18: 14:00:00 50 mL via INTRAVENOUS

## 2023-01-18 MED ORDER — BARIUM SULFATE 40 % (W/V) PO SUSP
90 mL | Freq: Once | ORAL | 0 refills | Status: CP
Start: 2023-01-18 — End: ?
  Administered 2023-01-18: 15:00:00 90 mL via ORAL

## 2023-01-18 MED ORDER — BARIUM SULFATE 700 MG PO TAB
700 mg | Freq: Once | ORAL | 0 refills | Status: CP
Start: 2023-01-18 — End: ?
  Administered 2023-01-18: 15:00:00 700 mg via ORAL

## 2023-01-27 ENCOUNTER — Encounter: Admit: 2023-01-27 | Discharge: 2023-01-27 | Payer: MEDICARE

## 2023-01-27 DIAGNOSIS — F250 Schizoaffective disorder, bipolar type: Secondary | ICD-10-CM

## 2023-01-27 MED ORDER — CLONAZEPAM 1 MG PO TAB
1 mg | ORAL_TABLET | Freq: Every day | ORAL | 1 refills
Start: 2023-01-27 — End: ?

## 2023-01-27 MED ORDER — TEMAZEPAM 15 MG PO CAP
ORAL_CAPSULE | 1 refills
Start: 2023-01-27 — End: ?

## 2023-01-27 MED ORDER — TEMAZEPAM 30 MG PO CAP
30 mg | ORAL_CAPSULE | Freq: Every evening | ORAL | 1 refills
Start: 2023-01-27 — End: ?

## 2023-01-27 MED ORDER — CLONAZEPAM 0.5 MG PO TAB
0.5 mg | ORAL_TABLET | Freq: Every morning | ORAL | 1 refills
Start: 2023-01-27 — End: ?

## 2023-02-04 ENCOUNTER — Encounter: Admit: 2023-02-04 | Discharge: 2023-02-04 | Payer: MEDICARE

## 2023-02-04 DIAGNOSIS — F250 Schizoaffective disorder, bipolar type: Secondary | ICD-10-CM

## 2023-02-04 DIAGNOSIS — R339 Retention of urine, unspecified: Secondary | ICD-10-CM

## 2023-02-04 MED ORDER — TAMSULOSIN 0.4 MG PO CAP
ORAL_CAPSULE | 11 refills
Start: 2023-02-04 — End: ?

## 2023-02-04 MED ORDER — OLANZAPINE 5 MG PO TBDI
ORAL_TABLET | 2 refills
Start: 2023-02-04 — End: ?

## 2023-02-07 ENCOUNTER — Encounter: Admit: 2023-02-07 | Discharge: 2023-02-07 | Payer: MEDICARE

## 2023-02-07 DIAGNOSIS — R339 Retention of urine, unspecified: Secondary | ICD-10-CM

## 2023-02-07 MED ORDER — TAMSULOSIN 0.4 MG PO CAP
ORAL_CAPSULE | 11 refills
Start: 2023-02-07 — End: ?

## 2023-02-08 ENCOUNTER — Encounter: Admit: 2023-02-08 | Discharge: 2023-02-08 | Payer: MEDICARE

## 2023-02-08 DIAGNOSIS — R339 Retention of urine, unspecified: Secondary | ICD-10-CM

## 2023-02-08 MED ORDER — TAMSULOSIN 0.4 MG PO CAP
ORAL_CAPSULE | 11 refills
Start: 2023-02-08 — End: ?

## 2023-02-09 ENCOUNTER — Encounter: Admit: 2023-02-09 | Discharge: 2023-02-09 | Payer: MEDICARE

## 2023-02-10 ENCOUNTER — Encounter: Admit: 2023-02-10 | Discharge: 2023-02-10 | Payer: MEDICARE

## 2023-02-10 MED FILL — CREON 24,000-76,000 -120,000 UNIT PO CPDR: 24000-76000 -120,000 unit | ORAL | 93 days supply | Qty: 1400 | Fill #3 | Status: AC

## 2023-02-10 MED FILL — TRULANCE 3 MG PO TAB: 3 mg | ORAL | 60 days supply | Qty: 30 | Fill #3 | Status: AC

## 2023-02-11 ENCOUNTER — Encounter: Admit: 2023-02-11 | Discharge: 2023-02-11 | Payer: MEDICARE

## 2023-02-11 DIAGNOSIS — R339 Retention of urine, unspecified: Secondary | ICD-10-CM

## 2023-02-11 MED ORDER — TAMSULOSIN 0.4 MG PO CAP
.4 mg | ORAL_CAPSULE | Freq: Two times a day (BID) | ORAL | 11 refills | 90.00000 days | Status: AC
Start: 2023-02-11 — End: ?

## 2023-02-12 ENCOUNTER — Encounter: Admit: 2023-02-12 | Discharge: 2023-02-12 | Payer: MEDICARE

## 2023-03-01 ENCOUNTER — Encounter: Admit: 2023-03-01 | Discharge: 2023-03-01 | Payer: MEDICARE

## 2023-03-01 DIAGNOSIS — F250 Schizoaffective disorder, bipolar type: Secondary | ICD-10-CM

## 2023-03-01 MED ORDER — OLANZAPINE 20 MG PO TAB
20 mg | ORAL_TABLET | Freq: Every evening | ORAL | 0 refills | 30.00000 days | Status: AC
Start: 2023-03-01 — End: ?

## 2023-03-01 MED ORDER — LITHIUM CARBONATE 150 MG PO CAP
ORAL_CAPSULE | ORAL | 0 refills | 90.00000 days | Status: AC
Start: 2023-03-01 — End: ?

## 2023-03-01 MED ORDER — OLANZAPINE 5 MG PO TAB
5 mg | ORAL_TABLET | Freq: Every morning | ORAL | 0 refills | 30.00000 days | Status: AC
Start: 2023-03-01 — End: ?

## 2023-03-01 MED ORDER — PROPRANOLOL 10 MG PO TAB
10 mg | ORAL_TABLET | Freq: Two times a day (BID) | ORAL | 0 refills | Status: AC
Start: 2023-03-01 — End: ?

## 2023-03-05 ENCOUNTER — Encounter: Admit: 2023-03-05 | Discharge: 2023-03-05 | Payer: MEDICARE

## 2023-03-05 DIAGNOSIS — F250 Schizoaffective disorder, bipolar type: Secondary | ICD-10-CM

## 2023-03-05 DIAGNOSIS — R55 Syncope and collapse: Secondary | ICD-10-CM

## 2023-03-05 DIAGNOSIS — I4510 Unspecified right bundle-branch block: Secondary | ICD-10-CM

## 2023-03-05 DIAGNOSIS — R002 Palpitations: Secondary | ICD-10-CM

## 2023-03-05 MED ORDER — OLANZAPINE 5 MG PO TBDI
ORAL_TABLET | 0 refills
Start: 2023-03-05 — End: ?

## 2023-03-08 ENCOUNTER — Encounter: Admit: 2023-03-08 | Discharge: 2023-03-08 | Payer: MEDICARE

## 2023-03-08 DIAGNOSIS — F250 Schizoaffective disorder, bipolar type: Secondary | ICD-10-CM

## 2023-03-08 MED ORDER — OLANZAPINE 5 MG PO TBDI
ORAL_TABLET | 0 refills
Start: 2023-03-08 — End: ?

## 2023-03-10 ENCOUNTER — Encounter: Admit: 2023-03-10 | Discharge: 2023-03-10 | Payer: MEDICARE

## 2023-03-10 DIAGNOSIS — K5900 Constipation, unspecified: Secondary | ICD-10-CM

## 2023-03-10 DIAGNOSIS — K5904 Chronic idiopathic constipation: Secondary | ICD-10-CM

## 2023-03-10 MED ORDER — TRULANCE 3 MG PO TAB
1.5 mg | ORAL_TABLET | Freq: Every day | ORAL | 2 refills
Start: 2023-03-10 — End: ?

## 2023-03-11 ENCOUNTER — Encounter: Admit: 2023-03-11 | Discharge: 2023-03-11 | Payer: MEDICARE

## 2023-03-12 ENCOUNTER — Encounter: Admit: 2023-03-12 | Discharge: 2023-03-12 | Payer: MEDICARE

## 2023-03-21 ENCOUNTER — Encounter: Admit: 2023-03-21 | Discharge: 2023-03-21 | Payer: MEDICARE

## 2023-03-21 DIAGNOSIS — Z79899 Other long term (current) drug therapy: Secondary | ICD-10-CM

## 2023-03-26 ENCOUNTER — Encounter: Admit: 2023-03-26 | Discharge: 2023-03-26 | Payer: MEDICARE

## 2023-03-26 ENCOUNTER — Ambulatory Visit: Admit: 2023-03-26 | Discharge: 2023-03-27 | Payer: MEDICARE

## 2023-03-26 ENCOUNTER — Ambulatory Visit: Admit: 2023-03-26 | Discharge: 2023-03-26 | Payer: MEDICARE

## 2023-03-26 DIAGNOSIS — R1310 Dysphagia, unspecified: Secondary | ICD-10-CM

## 2023-03-26 DIAGNOSIS — D72819 Decreased white blood cell count, unspecified: Secondary | ICD-10-CM

## 2023-03-26 DIAGNOSIS — F79 Unspecified intellectual disabilities: Secondary | ICD-10-CM

## 2023-03-26 DIAGNOSIS — E039 Hypothyroidism, unspecified: Secondary | ICD-10-CM

## 2023-03-26 DIAGNOSIS — A048 Other specified bacterial intestinal infections: Secondary | ICD-10-CM

## 2023-03-26 DIAGNOSIS — R12 Heartburn: Secondary | ICD-10-CM

## 2023-03-26 DIAGNOSIS — R51 Headache: Secondary | ICD-10-CM

## 2023-03-26 DIAGNOSIS — R55 Syncope and collapse: Secondary | ICD-10-CM

## 2023-03-26 DIAGNOSIS — G249 Dystonia, unspecified: Secondary | ICD-10-CM

## 2023-03-26 DIAGNOSIS — K5900 Constipation, unspecified: Secondary | ICD-10-CM

## 2023-03-26 DIAGNOSIS — Z79899 Other long term (current) drug therapy: Secondary | ICD-10-CM

## 2023-03-26 DIAGNOSIS — Z9181 History of falling: Secondary | ICD-10-CM

## 2023-03-26 DIAGNOSIS — R21 Rash and other nonspecific skin eruption: Secondary | ICD-10-CM

## 2023-03-26 DIAGNOSIS — F840 Autistic disorder: Secondary | ICD-10-CM

## 2023-03-26 DIAGNOSIS — K56609 Unspecified intestinal obstruction, unspecified as to partial versus complete obstruction: Secondary | ICD-10-CM

## 2023-03-26 NOTE — Patient Instructions
Chad Randall,  Orders have been placed for you to have an EGD with dilation.  One of our schedulers will reach out to you to get this set up. I have included prep instructions to this message.  If you have not heard from scheduling within 5 business days, or need to reschedule, PLEASE call [phone] select option 3.    Please call or send a MyChart message if you have any questions or concerns, [phone].      Thank you,    Mindy, RN, BSN    Gastroenterology  Normajean Baxter, M.D.  Meyer Cory, M.D.  Phone: [phone]  Fax: [phone]        EGD (ESOPHAGOGASTRODUODENOSCOPY) PREP      Upper GI endoscopy allows healthcare providers to look directly into the beginning of your gastrointestinal(GI) tract.  The esophagus, stomach, and duodenum (first part of the small intestine) make up the upper GI tract.       1 Week Prior to Exam:  If you are taking medication such as Ozempic, Mounjaro, Wegovy,  Zepbound, etc.  please hold these until after your procedure.    5 Days Prior to Exam:  Check with your prescribing physician for instructions about stopping your blood thinner.Examples of blood thinners are Aleve, Aspirin, Coumadin, Eliquis, Ibuprofen, Naproxen, Plavix, and Xarelto.  Do not give yourself a Lovenox injection the morning of the test. Lovenox injections may be taken as usual through the day before your test.    Day of Exam:  No solid food after midnight. You may drink clear liquids only up until 4 hours before your scheduled procedure time.After this, you should have nothing by mouth.  This includes GUM or CANDY.          Chewing tobacco must be stopped  6 hours before your scheduled procedure.  If you have an early morning test, take ONLY your essential morning medications (heart, blood pressure, seizure, etc.) with a small sip of water.  You will be sedated for the procedure. A responsible adult must drive you home (no Benedetto Goad, taxis, or buses are permitted). If you do not have a driver we will be unable to do the test.  You will be here for 3-4 hours from arrival time.  You will not be able to return to work the same day.  Please bring a list of your current medications and the dosages with you.     The Procedure:  You will lie on the endoscopy table. Usually patients lie on the left side.  You will be monitored and given oxygen.  You are given sedation (relaxing) medication through an intravenous (IV) line.  The healthcare provider will put the endoscope in your mouth and down your esophagus. It is thinner than most pieces of food that you swallow.  It will not affect your breathing. The medicine helps keep you from gagging.  Air is inserted to expand your GI tract. It can make you burp.  During the procedure, the healthcare provider can take biopsies (tissue samples), remove abnormalities such as polyps, or treat abnormalities through a variety of devices placed through the endoscope. You will not feel this.  The endoscope carries images of your upper GI tract to a video screen.  An adult must drive you home.

## 2023-03-28 ENCOUNTER — Encounter: Admit: 2023-03-28 | Discharge: 2023-03-28 | Payer: MEDICARE

## 2023-03-28 DIAGNOSIS — F250 Schizoaffective disorder, bipolar type: Secondary | ICD-10-CM

## 2023-03-28 MED ORDER — CLONAZEPAM 1 MG PO TAB
1 mg | ORAL_TABLET | Freq: Every day | ORAL | 1 refills
Start: 2023-03-28 — End: ?

## 2023-03-28 MED ORDER — CLONAZEPAM 0.5 MG PO TAB
0.5 mg | ORAL_TABLET | Freq: Every morning | ORAL | 1 refills
Start: 2023-03-28 — End: ?

## 2023-03-28 MED ORDER — TEMAZEPAM 30 MG PO CAP
ORAL_CAPSULE | 1 refills
Start: 2023-03-28 — End: ?

## 2023-03-28 MED ORDER — TEMAZEPAM 15 MG PO CAP
ORAL_CAPSULE | 1 refills
Start: 2023-03-28 — End: ?

## 2023-03-29 ENCOUNTER — Encounter: Admit: 2023-03-29 | Discharge: 2023-03-29 | Payer: MEDICARE

## 2023-03-29 MED FILL — TRULANCE 3 MG PO TAB: 3 mg | ORAL | 60 days supply | Qty: 30 | Fill #1 | Status: AC

## 2023-04-03 ENCOUNTER — Encounter: Admit: 2023-04-03 | Discharge: 2023-04-03 | Payer: MEDICARE

## 2023-04-03 DIAGNOSIS — F250 Schizoaffective disorder, bipolar type: Secondary | ICD-10-CM

## 2023-04-03 MED ORDER — PROPRANOLOL 10 MG PO TAB
10 mg | ORAL_TABLET | Freq: Two times a day (BID) | ORAL | 0 refills
Start: 2023-04-03 — End: ?

## 2023-04-03 MED ORDER — OLANZAPINE 5 MG PO TAB
ORAL_TABLET | 0 refills
Start: 2023-04-03 — End: ?

## 2023-04-03 MED ORDER — OLANZAPINE 20 MG PO TAB
20 mg | ORAL_TABLET | Freq: Every evening | ORAL | 0 refills
Start: 2023-04-03 — End: ?

## 2023-04-03 MED ORDER — LITHIUM CARBONATE 150 MG PO CAP
ORAL_CAPSULE | 0 refills
Start: 2023-04-03 — End: ?

## 2023-04-03 MED ORDER — OLANZAPINE 5 MG PO TBDI
ORAL_TABLET | 0 refills
Start: 2023-04-03 — End: ?

## 2023-05-10 ENCOUNTER — Ambulatory Visit: Admit: 2023-05-10 | Discharge: 2023-05-11 | Payer: MEDICARE

## 2023-05-10 ENCOUNTER — Encounter: Admit: 2023-05-10 | Discharge: 2023-05-10 | Payer: MEDICARE

## 2023-05-10 DIAGNOSIS — F79 Unspecified intellectual disabilities: Secondary | ICD-10-CM

## 2023-05-10 DIAGNOSIS — K56609 Unspecified intestinal obstruction, unspecified as to partial versus complete obstruction: Secondary | ICD-10-CM

## 2023-05-10 DIAGNOSIS — R55 Syncope and collapse: Secondary | ICD-10-CM

## 2023-05-10 DIAGNOSIS — Z79899 Other long term (current) drug therapy: Secondary | ICD-10-CM

## 2023-05-10 DIAGNOSIS — Z9181 History of falling: Secondary | ICD-10-CM

## 2023-05-10 DIAGNOSIS — A048 Other specified bacterial intestinal infections: Secondary | ICD-10-CM

## 2023-05-10 DIAGNOSIS — R21 Rash and other nonspecific skin eruption: Secondary | ICD-10-CM

## 2023-05-10 DIAGNOSIS — D72819 Decreased white blood cell count, unspecified: Secondary | ICD-10-CM

## 2023-05-10 DIAGNOSIS — K5900 Constipation, unspecified: Secondary | ICD-10-CM

## 2023-05-10 DIAGNOSIS — E039 Hypothyroidism, unspecified: Secondary | ICD-10-CM

## 2023-05-10 DIAGNOSIS — R12 Heartburn: Secondary | ICD-10-CM

## 2023-05-10 DIAGNOSIS — G249 Dystonia, unspecified: Secondary | ICD-10-CM

## 2023-05-10 DIAGNOSIS — F840 Autistic disorder: Secondary | ICD-10-CM

## 2023-05-10 DIAGNOSIS — R51 Headache: Secondary | ICD-10-CM

## 2023-05-10 MED ORDER — LAMOTRIGINE 150 MG PO TAB
150 mg | ORAL_TABLET | Freq: Two times a day (BID) | ORAL | 2 refills | Status: AC
Start: 2023-05-10 — End: ?

## 2023-05-10 NOTE — Patient Instructions
It was nice to see you in clinic today.  If questions arise, you can reach the clinic by calling [phone].  Actions from today's visit:  Continue all medications    We will see you back in about 12 Week(s) for follow up.  Dr. Darlys Gales    General policies:  Please arrive 5-10 minutes earlier than your scheduled appointment to give our clinic staff time for rooming; this will help maximize the amount of time we can spend talking during your appointment.  Our clinic does not do long-term disability paperwork. On very rare circumstances we may do FMLA/short-term leave paperwork though we usually do not do this. Requests for filling out paperwork require an appointment for further discussion and review for paperwork to be filled out together.   Our clinic is dedicated to making sure your prescriptions are filled in a timely manner during regular business hours (8am-5pm).  If you need a medication refill, please call your pharmacy to request a refill.  Please make sure to request refills at least 72 hours in advance of your last dose.  Your pharmacy will reach out to [place] electronically, which is the preferred method.  Please try to refrain from paging the on-call psychiatrist regarding medication refills (including controlled substances), as you may not receive a refill or a full refill at that time.  For nonemergent concerns, you may reach out to the clinic via MyChart. You should receive a response within 72 business hours from our clinic staff/providers.    Communication via MyChart is appropriate for specific and straightforward concerns such as:   Clarification of medication instructions, questions about medications, reporting intolerable side effects    Refill requests    Review and discussion of lab results    Specific items we have discussed that I have asked for you to reach out to me about    MyChart is in inappropriate modality for:   Significant medication changes not previously discussed (starting or stopping medications)   If you require an immediate response from a health care professional, please do not call the on-call psychiatrist and call 911 directly.    Early refills will not be provided for controlled substances (benzodiazepines, stimulants) and it is important that you keep these medications safe because they will not be refilled early for any reason. Ongoing prescriptions for controlled substances require follow up visits that are maintained every 3 months.   [facility] has a zero tolerance policy for discrimination, mistreatment or abusive behavior, including verbal abuse.       Important contact information:   [facility] Phone: [phone]  Local Warm Line (not for crisis):               [facility]            phone: [phone] or [phone] (toll free)               [facility]                         4:00pm - 10:00pm every evening  Local Crisis Lines:               Name: [place] Crisis Line 24/7                phone:    [phone]               Name: [facility]  Mental Health 24/7                phone:    [phone]               Name: [facility] Mental Health Crisis Line 24/7            phone:    [phone]  National Suicide Prevention Lifelines & Textlines:                Dial or Text:       988                                               or chat: 988lifeline.org               or text:  [facility] [place] [facility] - Text HOPE to 20121               Option for Hearing Impaired: Dial 711 then 988 - Text Telephone   Local emergency services:                [facility] - Emergency Department                     [address]               [place], [place] [address]                                 phone: [phone]  Or call 911

## 2023-05-11 DIAGNOSIS — F250 Schizoaffective disorder, bipolar type: Secondary | ICD-10-CM

## 2023-05-17 ENCOUNTER — Encounter: Admit: 2023-05-17 | Discharge: 2023-05-17 | Payer: MEDICARE

## 2023-05-23 ENCOUNTER — Encounter: Admit: 2023-05-23 | Discharge: 2023-05-23 | Payer: MEDICARE

## 2023-05-24 ENCOUNTER — Encounter: Admit: 2023-05-24 | Discharge: 2023-05-24 | Payer: MEDICARE

## 2023-05-26 ENCOUNTER — Encounter: Admit: 2023-05-26 | Discharge: 2023-05-26 | Payer: MEDICARE

## 2023-05-27 ENCOUNTER — Encounter: Admit: 2023-05-27 | Discharge: 2023-05-27 | Payer: MEDICARE

## 2023-05-27 MED FILL — TRULANCE 3 MG PO TAB: 3 mg | ORAL | 60 days supply | Qty: 30 | Fill #2 | Status: AC

## 2023-05-27 MED FILL — CREON 24,000-76,000 -120,000 UNIT PO CPDR: 24000-76000 -120,000 unit | ORAL | 80 days supply | Qty: 1200 | Fill #4 | Status: AC

## 2023-06-17 ENCOUNTER — Encounter: Admit: 2023-06-17 | Discharge: 2023-06-17 | Payer: MEDICARE

## 2023-06-17 NOTE — Telephone Encounter
Mom of pt LVM that she does not have prep instructions for pt's upcoming procedure on 07/11/23.    Return call to mother and let her know that I sent the EGD instructions via MyChart.  Mom verbalized understanding.

## 2023-07-02 ENCOUNTER — Encounter: Admit: 2023-07-02 | Discharge: 2023-07-02 | Payer: MEDICARE

## 2023-07-05 ENCOUNTER — Encounter: Admit: 2023-07-05 | Discharge: 2023-07-05 | Payer: MEDICARE

## 2023-07-11 ENCOUNTER — Encounter: Admit: 2023-07-11 | Discharge: 2023-07-11 | Payer: MEDICARE

## 2023-07-11 ENCOUNTER — Ambulatory Visit: Admit: 2023-07-11 | Discharge: 2023-07-11 | Payer: MEDICARE

## 2023-07-11 MED ORDER — SODIUM CHLORIDE 0.9 % IJ SYRG
INTRAVENOUS | 0 refills | Status: DC
Start: 2023-07-11 — End: 2023-07-11

## 2023-07-11 MED ORDER — PROPOFOL INJ 10 MG/ML IV VIAL
INTRAVENOUS | 0 refills | Status: DC
Start: 2023-07-11 — End: 2023-07-11

## 2023-07-11 MED ORDER — LIDOCAINE (PF) 20 MG/ML (2 %) IJ SOLN
INTRAVENOUS | 0 refills | Status: DC
Start: 2023-07-11 — End: 2023-07-11

## 2023-07-11 MED ORDER — PROPOFOL 10 MG/ML IV EMUL 20 ML (INFUSION)(AM)(OR)
INTRAVENOUS | 0 refills | Status: DC
Start: 2023-07-11 — End: 2023-07-11
  Administered 2023-07-11: 15:00:00 150 ug/kg/min via INTRAVENOUS

## 2023-07-11 NOTE — Anesthesia Post-Procedure Evaluation
Post-Anesthesia Evaluation    Name: Flabio Vradenburg.      MRN: [id number]     DOB: [birth year]     Age: 57 y.o.     Sex: male   __________________________________________________________________________     Procedure Information       Anesthesia Start Date/Time: 07/11/23 0921    Procedure: ESOPHAGOGASTRODUODENOSCOPY WITH BIOPSY - FLEXIBLE    Location: ENDO 5 / ENDO/GI    Surgeons: Veneta Penton, MD            Post-Anesthesia Vitals  BP: 130/86 (12/12 0959)  Temp: 36.7 ?C (98.1 ?F) (12/12 0938)  Pulse: 71 (12/12 0959)  Respirations: 14 PER MINUTE (12/12 0959)  SpO2: 98 % (12/12 0959)  O2 Device: None (Room air) (12/12 0955)   Vitals Value Taken Time   BP 130/86 07/11/23 0959   Temp 36.7 ?C (98.1 ?F) 07/11/23 0938   Pulse 71 07/11/23 0959   Respirations 14 PER MINUTE 07/11/23 0959   SpO2 98 % 07/11/23 0959   O2 Device None (Room air) 07/11/23 0955   ABP     ART BP           Post Anesthesia Evaluation Note    Evaluation location: Pre/Post  Patient participation: recovered; patient participated in evaluation  Level of consciousness: alert  Pain management: adequate    Hydration: normovolemia  Temperature: 36.0?C - 38.4?C  Airway patency: adequate    Perioperative Events       Post-op nausea and vomiting: no PONV    Postoperative Status  Cardiovascular status: hemodynamically stable  Respiratory status: spontaneous ventilation  Follow-up needed: none        Perioperative Events  There were no known complications for this encounter.

## 2023-07-11 NOTE — Anesthesia Pre-Procedure Evaluation
Anesthesia Pre-Procedure Evaluation    Name: Chad Randall.      MRN: [id number]     DOB: 05/12/1966     Age: 57 y.o.     Sex: male   __________________________________________________________________________     Procedure Date: 07/11/2023   Procedure: Procedure(s):  ESOPHAGOGASTRODUODENOSCOPY, WITH BALLOON DILATION OF LESS THAN 30 MILLIMETERS     Physical Assessment  Vital Signs (last filed in past 24 hours):    138/89  95% on RA  70 bpm     Patient History  Allergies   Allergen Reactions    Diphenhydramine-Zinc Acetate AGITATION     Patient becomes agiitated and combative    Haloperidol Lactate AGITATION     Severe agitation per mother    Albuterol SEE COMMENTS     Extreme hyperactivity-per mom    Amitriptyline SEE COMMENTS     Uncontrollable muscle movements per mother    Antihistamine-1 AGITATION     Patient becomes agiitated and combative    Atomoxetine UNKNOWN    Bee Sting [Venom-Honey Bee] UNKNOWN    Chlorpheniram-Dm-Acetaminophen AGITATION     Patient becomes agiitated and combative    Clemastine UNKNOWN    Clozaril [Clozapine] AGITATION     Extreme anger/agitation    Depakote [Divalproex] SEE COMMENTS     Per mother worked well to control behavior, but caused liver tests to become abnormal so was stopped.      Erythromycin RASH     Allergy recorded in SMS: E-MYCIN~Reactions: RASH    Flurazepam UNKNOWN    Melatonin AGITATION    Phenylpropanolamine UNKNOWN    Pseudoephedrine UNKNOWN    Tripelennamine UNKNOWN    Triprolidine UNKNOWN    Zolpidem AGITATION      Per GI office note:  Chad Randall is a 57 y.o. male prior patient of Dr. Jomarie Longs now current patient of Dr. Francisco Capuchin with past medical history of pelvic floor dysfunction per ARM which was felt to be inconclusive based on patient's capacity to comply with testing, colonic inertia per sits marker study, autism, bipolar, heartburn, hypothyroidism, small bowel obstruction 2015, H. pylori gastritis 2018, small intestinal bacterial overgrowth with robust response to Flagyl and Doxycycline returns to clinic for continued management of chronic constipation, bright red blood per rectum, abdominal distention and postprandial abdominal pain which improves with defecation.     Current Medications    Medication Directions   aspirin EC 81 mg tablet Take one tablet by mouth daily. Take with food.   atorvastatin (LIPITOR) 20 mg tablet Take one tablet by mouth at bedtime daily.   bisacodyl (DULCOLAX) 5 mg tablet Take 1 Tab by mouth twice daily.   clonazePAM (KLONOPIN) 0.5 mg tablet Take one tablet by mouth every morning. First fill 06/01/23.   clonazePAM (KLONOPIN) 1 mg tablet Take one tablet by mouth daily. First fill 06/01/23.   DOCOSAHEXANOIC ACID/EPA (FISH OIL PO) Take 2 Caps by mouth daily.   docusate (COLACE) 100 mg capsule Take one capsule by mouth twice daily.   glucosamine(+) 500 mg tab Take one tablet by mouth daily.   hydrocortisone (HYTONE) 2.5 % topical cream Apply  topically to affected area twice daily.   lamoTRIgine (LAMICTAL) 150 mg tablet Take one tablet by mouth twice daily.   levothyroxine (SYNTHROID) 100 mcg tablet Take one tablet by mouth daily.   lipase-protease-amylase (CREON) 24000-76000-120000 units capsule Take three capsules by mouth three times daily with meals AND two capsules with snacks.  Up to 3 snacks  per day   lithium carbonate 150 mg capsule TAKE TWO CAPSULES BY MOUTH EVERY MORNING and TAKE THREE CAPSULES EVERY NIGHT AT BEDTIME WITH FOOD   lubiprostone (AMITIZA) 24 mcg capsule TAKE ONE CAPSULE BY MOUTH TWICE DAILY WITH MEALS   magnesium citrate oral solution Take 296 mL by mouth as Needed.   magnesium four hundred mEq twice daily.   OLANZapine (ZYPREXA ZYDIS) 5 mg rapid dissolve tablet DISSOLVE 1 TABLET BY MOUTH EVERY DAY AS NEEDED (call clinic to schedule visit ASAP)   OLANZapine (ZYPREXA) 20 mg tablet TAKE ONE TABLET BY MOUTH EVERY NIGHT AT BEDTIME   OLANZapine (ZYPREXA) 5 mg tablet TAKE ONE TABLET BY MOUTH EVERY MORNING (needs appt before refills)   omeprazole DR (PRILOSEC) 40 mg capsule TAKE ONE CAPSULE BY MOUTH EVERY DAY BEFORE BREAKFAST   plecanatide (TRULANCE) 3 mg tablet Take one-half tablet by mouth daily.   polyethylene glycol 3350 (GLYCOLAX; MIRALAX) 17 gram/dose powder Take seventeen g by mouth twice daily.   pramoxine-hydrocortisone (ANALPRAM-HC) 1-1 % rectal cream Insert or Apply one g to rectal area as directed twice daily.   propranoloL (INDERAL) 10 mg tablet TAKE ONE TABLET BY MOUTH TWICE DAILY   QUEtiapine (SEROQUEL) 100 mg tablet TAKE ONE TABLET BY MOUTH EVERY MORNING AND 1 TABLET AT 2PM *TAKE ALONG WITH 300MG  TAB FOR DAILY DOSE OF 400MG *   QUEtiapine (SEROQUEL) 300 mg tablet TAKE ONE TABLET BY MOUTH TWICE DAILY (MORNING AND 2PM ALONGWITH 100MG  TAB) AND 2 TABLETS EVERY NIGHT AT BEDTIME   senna (SENOKOT) 8.6 mg tablet Take two tablets by mouth twice daily.   tamsulosin (FLOMAX) 0.4 mg capsule Take one capsule by mouth twice daily. Do not crush, chew or open capsules. Take 30 minutes following the same meal each day.   temazepam (RESTORIL) 15 mg capsule TAKE ONE CAPSULE BY MOUTH EVERY NIGHT AT BEDTIME IN ADDITION TO 30MG  FOR A TOTAL OF 45MG  AT BEDTIME. First fill 06/01/23.   temazepam (RESTORIL) 30 mg capsule Take one capsule by mouth at bedtime daily. First fill 06/01/23.         Review of Systems/Medical History      Patient summary reviewed  Nursing notes reviewed  Pertinent labs reviewed    PONV Screening: Non-smoker    No history of anesthetic complications      Airway         No prior tracheostomy      Pulmonary       Not a current smoker        Cardiovascular - negative        Exercise tolerance: <4 METS (wheelchair)       Beta Blocker therapy: Yes      Beta blockers within 24 hours: Yes      No hypertension                  GI/Hepatic/Renal               No electrolyte problems      No bowel prep        Presents for EGD in the setting of chronic constipation, bright red blood per rectum, abdominal distention and postprandial abdominal pain which improves with defecation      Neuro/Psych         No hx TIA        No CVA      Chronic benzodiazepine use        Psychiatric history (MR; Schizophrenia; Autism)  Psychosis           Bipolar disorder          ADHD          Endocrine/Other           Hypothyroidism        Not obese         Physical Exam    Airway Findings      Mallampati: I      TM distance: >3 FB      Neck ROM: full      Mouth opening: good      Airway patency: adequate    Dental Findings:       Poor dentition    Cardiovascular Findings:       Rhythm: regular      Rate: normal    Pulmonary Findings:       Breath sounds clear to auscultation.    Abdominal Findings:       Not obese    Neurological Findings:       Alert and oriented x 3      Altered mental status: pt able to answer some questions but easily distracted and tangential.    Constitutional findings:       No acute distress       Diagnostic Tests  Hematology:   Lab Results   Component Value Date    HGB 14.4 10/17/2022    HCT 43.3 10/17/2022    PLTCT 186 10/17/2022    WBC 4.28 10/17/2022    NEUT 68 01/09/2022    ANC 3.19 01/09/2022    ALC 1.15 01/09/2022    MONA 7 01/09/2022    AMC 0.34 01/09/2022    EOSA 0 01/09/2022    ABC 0.00 01/09/2022    MCV 98.4 10/17/2022    MCH 32.7 10/17/2022    MCHC 33.3 10/17/2022    MPV 9.2 10/17/2022    RDW 13.9 01/09/2022         General Chemistry:   Lab Results   Component Value Date    NA 143 10/17/2022    K 3.6 10/17/2022    CL 107 10/17/2022    CO2 27 10/17/2022    GAP 9 10/17/2022    BUN 18 10/17/2022    CR 1.10 10/17/2022    GLU 97 10/17/2022    GLU 98 12/30/2005    CA 9.9 10/17/2022    ALBUMIN 4.3 10/17/2022    LACTIC 0.8 09/08/2013    MG 1.8 09/14/2013    TOTBILI 0.88 10/17/2022    PO4 2.5 09/08/2013      Coagulation:   Lab Results   Component Value Date    PTT 47.1 11/05/2017    INR 1.0 11/05/2017         Anesthesia Plan    ASA Randall: 2   Plan: MAC  NPO status: acceptable      Informed Consent  Anesthetic plan and risks discussed with patient.  Use of blood products discussed with patient  Blood Consent: consented      Plan discussed with: anesthesiologist, CRNA and surgeon/proceduralist.    [facility] Plan  Alerts

## 2023-07-13 ENCOUNTER — Encounter: Admit: 2023-07-13 | Discharge: 2023-07-13 | Payer: MEDICARE

## 2023-07-25 ENCOUNTER — Encounter: Admit: 2023-07-25 | Discharge: 2023-07-25 | Payer: MEDICARE

## 2023-08-01 ENCOUNTER — Encounter: Admit: 2023-08-01 | Discharge: 2023-08-01 | Payer: MEDICARE

## 2023-08-22 ENCOUNTER — Encounter: Admit: 2023-08-22 | Discharge: 2023-08-22 | Payer: MEDICARE

## 2023-08-22 ENCOUNTER — Ambulatory Visit: Admit: 2023-08-22 | Discharge: 2023-08-22 | Payer: MEDICARE

## 2023-08-22 DIAGNOSIS — R002 Palpitations: Secondary | ICD-10-CM

## 2023-08-22 NOTE — Progress Notes
Date of Service: 08/22/2023    Chad Randall. Chad Randall is a 58 y.o. male.       HPI     Chad Randall is followed for autism spectrum disorder, intellectual disability, and schizoaffective disorder bipolar type.  He has been treated in the past for infrequent palpitations.  He has been maintained on a small dose of propranolol which controls his palpitations fairly well.   His palpitations are not associated with lightheadedness, presyncope or syncope. His mother takes superb care of him.  He watches a lot of television.  He reports no falls over the past year.  I do not believe he that he has had any hospitalizations or emergency room visits over the past year.  Otherwise, the patient has been doing well and reports no angina, congestive symptoms, sensation of sustained forceful heart pounding, lightheadedness or syncope. His exercise tolerance has been stable.  His mother indicates that he is very active and walks long distances without any difficulty. The patient reports no myalgias, bleeding abnormalities, or strokelike symptoms.   Chad Randall mother checks his blood pressure at home periodically and his blood pressures been very well controlled recently.  His pulse usually runs 70-80 bpm.        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/2018    Lower urinary tract symptoms (LUTS) 05/28/2017     Consult 05/27/17  #1 complaint - increased LUTS x 1 year  Chronic dysuria, Frequency every 30 minutes, urge and urge incontinence, occasionally wearing Depends 3-4 day, nocturia x 2  No trial of OAB medications    05/27/17   PVR 180 MLs, PVR #2 3cc      Gross hematuria 05/28/2017     Intermittent gross hematuria      Weight loss 09/05/2016     Added automatically from request for surgery [id number]      Sleep disorder, circadian, irregular sleep-wake type 10/07/2014    Heart palpitations 06/03/2014    Atypical chest pain 06/03/2014    Incomplete right bundle branch block 06/03/2014    Autism spectrum disorder 09/08/2013    Bowel obstruction (HCC) 09/08/2013    Encounter for long-term (current) use of other medications 03/04/2012    Constipation 10/28/2010     Working with GI  BM every 5 days        Schizoaffective disorder, bipolar type (HCC) 12/22/2008    ADHD (attention deficit hyperactivity disorder), combined type 08/20/2008    Delirium 07/29/2008    Lithium adverse reaction 07/22/2008    Acute hypernatremia 07/22/2008    Diabetes insipidus (HCC) 07/22/2008    Abdominal pain 07/22/2008    Nonspecific ST-T wave electrocardiographic changes 07/22/2008    Disturbance of skin sensation 08/26/2006    Syncope and collapse 08/26/2006         Review of Systems   Constitutional: Negative.   HENT: Negative.  Eyes: Negative.    Cardiovascular: Negative.    Respiratory: Negative.     Endocrine: Negative.    Hematologic/Lymphatic: Negative.    Skin: Negative.    Musculoskeletal: Negative.    Gastrointestinal: Negative.    Genitourinary: Negative.    Neurological: Negative.    Psychiatric/Behavioral: Negative.     Allergic/Immunologic: Negative.        Physical Exam  GENERAL: The patient is slender but resting comfortably and in no distress.   HEENT: No abnormalities of the visible oro-nasopharynx, conjunctiva or sclera are noted.  NECK: There is no jugular venous distension. Carotids are palpable and without bruits. There is no thyroid enlargement.  Chest: Lung fields are clear to auscultation. There are no wheezes or crackles.  CV: There is a regular rhythm. The first and second heart sounds are normal. There are no murmurs, gallops or rubs.  His apical heart rate is 76 bpm  ABD: The abdomen is soft and supple with normal bowel sounds. There is no hepatosplenomegaly, ascites, tenderness, masses or bruits.  Neuro: There are no focal motor defects. Ambulation is normal. Cognitive function is abnormal.  He does follow commands promptly and can answer simple questions with a phrase.  He likes to talk about his favorite television programs and movies.    SKIN: There are no rashes and no cellulitis  PSYCH: The patient is calm, rationale and oriented    Cardiovascular Studies  A twelve-lead ECG obtained on March 05, 2023 reveals normal sinus rhythm with a heart rate of 60 bpm.  Right bundle branch block was noted.  Long-term event monitor 11/22/2017:  An event monitor was obtained to evaluate for symptoms of palpitations.  The duration of the event monitor was 6 days and 20 hours.  The underlying heart rhythm is normal sinus rhythm with an average heart rate of 76 bpm.  The heart rate range is 51 to 130 bpm.  Rare isolated premature supraventricular ectopic beats are noted with a total of 39 recorded throughout the entire monitoring period.  There were no paired premature supraventricular ectopic beats and there was no supraventricular tachycardia seen, either nonsustained or sustained.  Rare isolated ventricular ectopy was noted with a total of 55 ventricular ectopic beats recorded throughout the entire monitoring period.  There were no paired premature ventricular ectopic beats and there was no ventricular tachycardia seen, either nonsustained or sustained.  There was no ventricular bigeminy or trigeminy seen.  There were no significant pauses noted.  No second or third-degree AV block was noted.  There was no atrial fibrillation or atrial flutter seen.  The patient did report symptoms: Symptoms of fluttering/racing/lightheadedness/dizziness were reported at 8:30 PM on 11/13/2017 and this correlated with sinus rhythm with a heart rate of 91 bpm and no arrhythmias or ectopy were noted at the time.  Symptoms of pounding/dizziness were reported at 10:17 PM on 11/14/2017.  The heart rhythm at the time was sinus rhythm with a rate of 80 bpm no arrhythmias or ectopy were noted at the time.  Symptoms of fluttering/racing/pounding/chest pain-pressure or reported at 8 AM on 11/17/2017.  The heart rhythm at the time was sinus rhythm with a rate of 83 bpm and no ectopy or arrhythmias were noted at the time.  Symptoms of fluttering/racing/dizziness/chest pain-pressure/anxiety were reported at 9:40 AM on 11/17/2017.  The heart rhythm at the time was normal sinus rhythm with a rate of 99 bpm and no ectopy or arrhythmias were noted at the time.  Symptoms of  fluttering/racing/anxiety were reported at 9 AM on 11/18/2017.  The heart rhythm at the time was normal sinus rhythm with a rate of 90 bpm and no ectopy or arrhythmias were noted at the time.  A triggered event at 10:16 PM on 11/14/2017 correlated with sinus rhythm with heart rate of 77 bpm.  No ectopy or arrhythmias were noted at the time.  A triggered event at 9:41 AM on 11/17/2017 correlated with sinus rhythm with a heart rate of 98 bpm.  No ectopy or arrhythmias were noted at the time.  A triggered event at 9:10 AM on 11/18/2017 correlated with normal sinus rhythm with a heart rate of 82 bpm.  No arrhythmias or ectopy were noted at the time.  Cardiovascular Health Factors  Vitals BP Readings from Last 3 Encounters:   08/22/23 (!) 129/94   07/11/23 114/82   03/26/23 116/81     Wt Readings from Last 3 Encounters:   08/22/23 56.5 kg (124 lb 9.6 oz)   07/11/23 54.9 kg (121 lb)   05/10/23 54.9 kg (121 lb)     BMI Readings from Last 3 Encounters:   08/22/23 20.11 kg/m?   07/11/23 19.53 kg/m?   05/10/23 19.53 kg/m?      Smoking Social History     Tobacco Use   Smoking Status Never   Smokeless Tobacco Never      Lipid Profile Cholesterol   Date Value Ref Range Status   10/17/2022 122 Final     HDL   Date Value Ref Range Status   10/17/2022 48  Final     LDL   Date Value Ref Range Status   10/17/2022 64  Final     Triglycerides   Date Value Ref Range Status   10/17/2022 52  Final      Blood Sugar Hemoglobin A1C   Date Value Ref Range Status   01/12/2022 5.4 4.0 - 5.7 % Final     Comment:     The ADA recommends that most patients with type 1 and type 2 diabetes maintain   an A1c level <7%.       Glucose   Date Value Ref Range Status   10/17/2022 97  Final   01/09/2022 75 70 - 100 MG/DL Final   16/04/9603 540 (H) 70 - 105 Final     Glucose, POC   Date Value Ref Range Status   09/08/2013 114 (H) 70 - 100 MG/DL Final   98/05/9146 82 70 - 100 MG/DL Final   82/95/6213 086 (H) 70 - 100 MG/DL Final          Problems Addressed Today  Palpitations.    Assessment and Plan     Chad Randall appears stable from a cardiovascular perspective.  His blood pressure appears well controlled when checked at home.  His palpitations likewise appear to be under good control.  I told the patient's mother that he can always take an extra 10 mg of propranolol if his palpitations increase.  No other changes in his medical regimen were made today.  He reports no angina or congestive symptoms.  Fall precautions were strongly reinforced.  Cardiovascular risk factor modification was reviewed in detail.  I have asked him to return for follow-up in 12 months. The total time spent during this interview and exam with preparation and chart review was 30 minutes.         Current Medications (including today's revisions)   aspirin EC 81 mg tablet Take one tablet  by mouth daily. Take with food.    atorvastatin (LIPITOR) 20 mg tablet Take one tablet by mouth at bedtime daily.    bisacodyl (DULCOLAX) 5 mg tablet Take 1 Tab by mouth twice daily.    clonazePAM (KLONOPIN) 1 mg tablet Take one-half tablet by mouth every morning AND one tablet at bedtime daily.    DOCOSAHEXANOIC ACID/EPA (FISH OIL PO) Take 2 Caps by mouth daily.    docusate (COLACE) 100 mg capsule Take one capsule by mouth twice daily.    glucosamine(+) 500 mg tab Take one tablet by mouth daily.    hydrocortisone (HYTONE) 2.5 % topical cream Apply  topically to affected area twice daily.    lamoTRIgine (LAMICTAL) 150 mg tablet Take one tablet by mouth twice daily.    levothyroxine (SYNTHROID) 100 mcg tablet Take one tablet by mouth daily.    lithium carbonate 150 mg capsule TAKE TWO CAPSULES BY MOUTH EVERY MORNING and TAKE THREE CAPSULES EVERY NIGHT AT BEDTIME WITH FOOD    lubiprostone (AMITIZA) 24 mcg capsule TAKE ONE CAPSULE BY MOUTH TWICE DAILY WITH MEALS    magnesium citrate oral solution Take 296 mL by mouth as Needed.    magnesium four hundred mEq twice daily.    OLANZapine (ZYPREXA ZYDIS) 5 mg rapid dissolve tablet DISSOLVE 1 TABLET BY MOUTH EVERY DAY AS NEEDED (call clinic to schedule visit ASAP)    OLANZapine (ZYPREXA) 20 mg tablet TAKE ONE TABLET BY MOUTH EVERY NIGHT AT BEDTIME    OLANZapine (ZYPREXA) 5 mg tablet TAKE ONE TABLET BY MOUTH EVERY MORNING (needs appt before refills)    omeprazole DR (PRILOSEC) 40 mg capsule TAKE ONE CAPSULE BY MOUTH EVERY DAY BEFORE BREAKFAST    plecanatide (TRULANCE) 3 mg tablet Take one-half tablet by mouth daily.    polyethylene glycol 3350 (GLYCOLAX; MIRALAX) 17 gram/dose powder Take seventeen g by mouth twice daily.    pramoxine-hydrocortisone (ANALPRAM-HC) 1-1 % rectal cream Insert or Apply one g to rectal area as directed twice daily.    propranoloL (INDERAL) 10 mg tablet TAKE ONE TABLET BY MOUTH TWICE DAILY    QUEtiapine (SEROQUEL) 100 mg tablet TAKE ONE TABLET BY MOUTH EVERY MORNING AND 1 TABLET AT 2PM *TAKE ALONG WITH 300MG  TAB FOR DAILY DOSE OF 400MG *    QUEtiapine (SEROQUEL) 300 mg tablet TAKE ONE TABLET BY MOUTH TWICE DAILY (MORNING AND 2PM ALONGWITH 100MG  TAB) AND 2 TABLETS EVERY NIGHT AT BEDTIME    senna (SENOKOT) 8.6 mg tablet Take two tablets by mouth twice daily.    tamsulosin (FLOMAX) 0.4 mg capsule Take one capsule by mouth twice daily. Do not crush, chew or open capsules. Take 30 minutes following the same meal each day.    temazepam (RESTORIL) 15 mg capsule TAKE ONE CAPSULE BY MOUTH EVERY NIGHT AT BEDTIME IN ADDITION TO 30MG  FOR A TOTAL OF 45MG  AT BEDTIME    temazepam (RESTORIL) 30 mg capsule TAKE ONE CAPSULE BY MOUTH EVERY NIGHT AT BEDTIME

## 2023-08-22 NOTE — Patient Instructions
Thank you for visiting our office today.    We would like to make the following medication adjustments:  NONE       Otherwise continue the same medications as you have been doing.          We will be pursuing the following tests after your appointment today:            We will plan to see you back in 12 months.  Please call us in the meantime with any questions or concerns.        Please allow 5-7 business days for our providers to review your results. All normal results will go to MyChart. If you do not have Mychart, it is strongly recommended to get this so you can easily view all your results. If you do not have mychart, we will attempt to call you once with normal lab and testing results. If we cannot reach you by phone with normal results, we will send you a letter.  If you have not heard the results of your testing after one week please give [place] a call.       Your Cardiovascular Medicine [place] Team Brett Canales, Pilar Jarvis, Shawna Orleans, and [place])  phone number is [phone].

## 2023-08-30 ENCOUNTER — Encounter: Admit: 2023-08-30 | Discharge: 2023-08-30 | Payer: MEDICARE

## 2023-09-22 ENCOUNTER — Encounter: Admit: 2023-09-22 | Discharge: 2023-09-22 | Payer: MEDICARE

## 2023-09-26 ENCOUNTER — Encounter: Admit: 2023-09-26 | Discharge: 2023-09-26 | Payer: MEDICARE

## 2023-09-26 DIAGNOSIS — F250 Schizoaffective disorder, bipolar type: Secondary | ICD-10-CM

## 2023-09-26 MED ORDER — CLONAZEPAM 1 MG PO TAB
1 mg | ORAL_TABLET | Freq: Every day | ORAL | 2 refills
Start: 2023-09-26 — End: ?

## 2023-09-26 MED ORDER — OLANZAPINE 5 MG PO TAB
ORAL_TABLET | ORAL | 2 refills | 30.00000 days | Status: AC
Start: 2023-09-26 — End: ?

## 2023-09-26 MED ORDER — OLANZAPINE 20 MG PO TAB
20 mg | ORAL_TABLET | Freq: Every evening | ORAL | 2 refills | 30.00000 days | Status: AC
Start: 2023-09-26 — End: ?

## 2023-09-26 MED ORDER — PROPRANOLOL 10 MG PO TAB
10 mg | ORAL_TABLET | Freq: Two times a day (BID) | ORAL | 2 refills | Status: AC
Start: 2023-09-26 — End: ?

## 2023-09-26 MED ORDER — LITHIUM CARBONATE 150 MG PO CAP
ORAL_CAPSULE | ORAL | 2 refills | 90.00000 days | Status: AC
Start: 2023-09-26 — End: ?

## 2023-09-26 MED ORDER — OLANZAPINE 5 MG PO TBDI
ORAL_TABLET | ORAL | 2 refills | 30.00000 days | Status: AC
Start: 2023-09-26 — End: ?

## 2023-10-20 ENCOUNTER — Encounter: Admit: 2023-10-20 | Discharge: 2023-10-20 | Payer: MEDICARE

## 2023-10-23 ENCOUNTER — Encounter: Admit: 2023-10-23 | Discharge: 2023-10-23 | Payer: MEDICARE

## 2023-10-23 ENCOUNTER — Ambulatory Visit: Admit: 2023-10-23 | Discharge: 2023-10-23 | Payer: MEDICARE

## 2023-10-23 DIAGNOSIS — F250 Schizoaffective disorder, bipolar type: Secondary | ICD-10-CM

## 2023-10-23 DIAGNOSIS — F840 Autistic disorder: Secondary | ICD-10-CM

## 2023-10-23 DIAGNOSIS — F902 Attention-deficit hyperactivity disorder, combined type: Secondary | ICD-10-CM

## 2023-10-23 MED ORDER — QUETIAPINE 100 MG PO TAB
ORAL_TABLET | 4 refills | Status: AC
Start: 2023-10-23 — End: ?

## 2023-10-23 MED ORDER — OLANZAPINE 5 MG PO TAB
ORAL_TABLET | ORAL | 0 refills | 30.00000 days | Status: DC
Start: 2023-10-23 — End: 2023-10-23

## 2023-10-23 MED ORDER — LAMOTRIGINE 150 MG PO TAB
150 mg | ORAL_TABLET | Freq: Two times a day (BID) | ORAL | 2 refills | Status: AC
Start: 2023-10-23 — End: ?

## 2023-10-23 MED ORDER — OLANZAPINE 5 MG PO TAB
ORAL_TABLET | ORAL | 0 refills | 30.00000 days | Status: AC
Start: 2023-10-23 — End: ?

## 2023-10-23 MED ORDER — QUETIAPINE 300 MG PO TAB
ORAL_TABLET | 4 refills | Status: AC
Start: 2023-10-23 — End: ?

## 2023-10-23 NOTE — Patient Instructions
 It was nice to see you in clinic today.  If questions arise, you can reach the clinic by calling [phone].    Actions from today's visit:  Change olanzapine dosing to 10mg  in the morning and 15mg  in the evening  Continue all other medications    We will see you back in about 3 Month(s) for follow up.  Dr. Darlys Gales    General policies:  Please arrive 5-10 minutes earlier than your scheduled appointment to give our clinic staff time for rooming; this will help maximize the amount of time we can spend talking during your appointment.  Our clinic does not do long-term disability paperwork. On very rare circumstances we may do FMLA/short-term leave paperwork though we usually do not do this. Requests for filling out paperwork require an appointment for further discussion and review for paperwork to be filled out together.   Our clinic is dedicated to making sure your prescriptions are filled in a timely manner during regular business hours (8am-5pm).  If you need a medication refill, please call your pharmacy to request a refill.  Please make sure to request refills at least 72 hours in advance of your last dose.  Your pharmacy will reach out to [place] electronically, which is the preferred method.  Please try to refrain from paging the on-call psychiatrist regarding medication refills (including controlled substances), as you may not receive a refill or a full refill at that time.  For nonemergent concerns, you may reach out to the clinic via MyChart. You should receive a response within 72 business hours from our clinic staff/providers.    Communication via MyChart is appropriate for specific and straightforward concerns such as:   Clarification of medication instructions, questions about medications, reporting intolerable side effects    Refill requests    Review and discussion of lab results    Specific items we have discussed that I have asked for you to reach out to me about    MyChart is in inappropriate modality for:   Significant medication changes not previously discussed (starting or stopping medications)   If you require an immediate response from a health care professional, please do not call the on-call psychiatrist and call 911 directly.    Early refills will not be provided for controlled substances (benzodiazepines, stimulants) and it is important that you keep these medications safe because they will not be refilled early for any reason. Ongoing prescriptions for controlled substances require follow up visits that are maintained every 3 months.   [facility] has a zero tolerance policy for discrimination, mistreatment or abusive behavior, including verbal abuse.       Important contact information:   [facility] Phone: [phone]  Local Warm Line (not for crisis):               [facility]            phone: [phone] or [phone] (toll free)               [facility]                         4:00pm - 10:00pm every evening  Local Crisis Lines:               Name: [facility] [place] Crisis Line 24/7                phone:    [phone]  Name: [facility] Mental Health 24/7                phone:    [phone]               Name: [facility] Mental Health Crisis Line 24/7            phone:    [phone]  National Suicide Prevention Lifelines & Textlines:                Dial or Text:       988                                               or chat: 988lifeline.org               or text:  [facility] - Text HOPE to 20121               Option for Hearing Impaired: Dial 711 then 988 - Text Telephone   Local emergency services:                [facility] - Emergency Department                     [address]               [place], [place] [address]                                 phone: [phone]  Or call 911

## 2023-10-23 NOTE — Progress Notes
 ATTENDING NOTE  I saw and evaluated Chad Hew., discussed with Emeline Darling Buschjost, DO and concur with the assessment and treatment plan. Patient is 57 y.o. male with  ASD, IED, and ID . Psychiatric symptoms well controlled at today's encounter, some changes to dosing olanzapine are dictated below. Denies SI/HI and AVH and no other safety concerns. Pt reports no medication side effects.    PLAN:  The following medication changes were made during this visit to better treat the above symptoms:  Change Zyprexa to 10mg  QAM and 15mg  QHS  Continue Lamictal 150mg  PO BID  Continue Lithium 300mg  PO QAM and 450mg  PO QHS  Continue Seroquel 400mg  PO BID and 600mg  PO QHS  Continue Restoril 45mg  PO QHS PRN  Continue Klonopin 0.5mg  PO QAM and 1mg  PO Daily  Continue Propranolol 10mg  PO BID  AIMS  A1c, FLP, CMP, CBC, Lithium, and TSH  ordered and reviewed today  TSH was significantly elevated, will defer thyroid replacement for PCP, will not change lithium dose at this time given benefit currently, may consider reduction or changing dose in the future if needing to discontinue      aspirin EC 81 mg tablet Take one tablet by mouth daily. Take with food.    atorvastatin (LIPITOR) 20 mg tablet Take one tablet by mouth at bedtime daily.    bisacodyl (DULCOLAX) 5 mg tablet Take 1 Tab by mouth twice daily.    clonazePAM (KLONOPIN) 1 mg tablet Take 1/2 tablet by mouth every morning and take 1 tablet every night at bedtime.    DOCOSAHEXANOIC ACID/EPA (FISH OIL PO) Take 2 Caps by mouth daily.    docusate (COLACE) 100 mg capsule Take one capsule by mouth twice daily.    glucosamine(+) 500 mg tab Take one tablet by mouth daily.    hydrocortisone (HYTONE) 2.5 % topical cream Apply  topically to affected area twice daily.    lamoTRIgine (LAMICTAL) 150 mg tablet Take one tablet by mouth twice daily.    levothyroxine (SYNTHROID) 100 mcg tablet Take one tablet by mouth daily.    lithium carbonate 150 mg capsule TAKE TWO CAPSULES BY MOUTH EVERY MORNING and TAKE THREE CAPSULES EVERY NIGHT AT BEDTIME WITH FOOD    lubiprostone (AMITIZA) 24 mcg capsule TAKE ONE CAPSULE BY MOUTH TWICE DAILY WITH MEALS    magnesium citrate oral solution Take 296 mL by mouth as Needed.    magnesium four hundred mEq twice daily.    OLANZapine (ZYPREXA ZYDIS) 5 mg rapid dissolve tablet DISSOLVE 1 TABLET BY MOUTH EVERY DAY AS NEEDED (call clinic to schedule visit ASAP)    OLANZapine (ZYPREXA) 5 mg tablet Take two tablets by mouth daily AND three tablets at bedtime daily.    omeprazole DR (PRILOSEC) 40 mg capsule TAKE ONE CAPSULE BY MOUTH EVERY DAY BEFORE BREAKFAST    plecanatide (TRULANCE) 3 mg tablet Take one-half tablet by mouth daily.    polyethylene glycol 3350 (GLYCOLAX; MIRALAX) 17 gram/dose powder Take seventeen g by mouth twice daily.    pramoxine-hydrocortisone (ANALPRAM-HC) 1-1 % rectal cream Insert or Apply one g to rectal area as directed twice daily.    propranoloL (INDERAL) 10 mg tablet TAKE ONE TABLET BY MOUTH TWICE DAILY    QUEtiapine (SEROQUEL) 100 mg tablet TAKE ONE TABLET BY MOUTH EVERY MORNING AND 1 TABLET AT 2PM *TAKE ALONG WITH 300MG  TAB FOR DAILY DOSE OF 400MG *    QUEtiapine (SEROQUEL) 300 mg tablet TAKE ONE TABLET BY MOUTH TWICE DAILY (MORNING AND  2PM ALONGWITH 100MG  TAB) AND 2 TABLETS EVERY NIGHT AT BEDTIME    senna (SENOKOT) 8.6 mg tablet Take two tablets by mouth twice daily.    tamsulosin (FLOMAX) 0.4 mg capsule Take one capsule by mouth twice daily. Do not crush, chew or open capsules. Take 30 minutes following the same meal each day.    [START ON 10/25/2023] temazepam (RESTORIL) 15 mg capsule TAKE ONE CAPSULE BY MOUTH EVERY NIGHT AT BEDTIME IN ADDITION TO 30MG  FOR A TOTAL OF 45MG  AT BEDTIME    temazepam (RESTORIL) 30 mg capsule TAKE ONE CAPSULE BY MOUTH EVERY NIGHT AT BEDTIME       [employer], DO  10/23/2023

## 2023-10-24 ENCOUNTER — Encounter: Admit: 2023-10-24 | Discharge: 2023-10-24 | Payer: MEDICARE

## 2023-10-24 DIAGNOSIS — Z79899 Other long term (current) drug therapy: Secondary | ICD-10-CM

## 2023-10-24 DIAGNOSIS — F250 Schizoaffective disorder, bipolar type: Secondary | ICD-10-CM

## 2023-10-24 NOTE — Progress Notes
 OLANZapine 5MG  tablets  PA sent via CoverMyMeds  Key: [id number]

## 2023-10-25 ENCOUNTER — Encounter: Admit: 2023-10-25 | Discharge: 2023-10-25 | Payer: MEDICARE

## 2023-11-27 ENCOUNTER — Encounter: Admit: 2023-11-27 | Discharge: 2023-11-27 | Payer: MEDICARE

## 2023-11-27 DIAGNOSIS — F250 Schizoaffective disorder, bipolar type: Secondary | ICD-10-CM

## 2023-11-27 MED ORDER — OLANZAPINE 10 MG PO TAB
10 mg | ORAL_TABLET | Freq: Every morning | ORAL | 0 refills | 30.00000 days | Status: AC
Start: 2023-11-27 — End: ?

## 2023-11-27 MED ORDER — OLANZAPINE 15 MG PO TAB
15 mg | ORAL_TABLET | Freq: Every evening | ORAL | 0 refills | 30.00000 days | Status: AC
Start: 2023-11-27 — End: ?

## 2023-11-27 NOTE — Telephone Encounter
 Last office visit: 10/23/2023  Upcoming visit: Visit date not found  Last fill: 10/24/2023  Plan:Change Zyprexa to 10mg  QAM and 15mg  QHS

## 2023-11-28 ENCOUNTER — Encounter: Admit: 2023-11-28 | Discharge: 2023-11-28 | Payer: MEDICARE

## 2023-12-15 ENCOUNTER — Encounter: Admit: 2023-12-15 | Discharge: 2023-12-15 | Payer: MEDICARE

## 2023-12-16 ENCOUNTER — Encounter: Admit: 2023-12-16 | Discharge: 2023-12-16 | Payer: MEDICARE

## 2023-12-16 DIAGNOSIS — K5904 Chronic idiopathic constipation: Secondary | ICD-10-CM

## 2023-12-16 DIAGNOSIS — R109 Unspecified abdominal pain: Secondary | ICD-10-CM

## 2023-12-16 MED ORDER — CREON 24,000-76,000 -120,000 UNIT PO CPDR
ORAL_CAPSULE | ORAL | 0 refills | 30.00000 days | Status: AC
Start: 2023-12-16 — End: ?
  Filled 2023-12-21: qty 1300, 87d supply, fill #1

## 2023-12-18 ENCOUNTER — Encounter: Admit: 2023-12-18 | Discharge: 2023-12-18 | Payer: MEDICARE

## 2023-12-19 MED FILL — TRULANCE 3 MG PO TAB: 3 mg | ORAL | 60 days supply | Qty: 30 | Fill #3 | Status: AC

## 2023-12-20 ENCOUNTER — Encounter: Admit: 2023-12-20 | Discharge: 2023-12-20 | Payer: MEDICARE

## 2023-12-21 ENCOUNTER — Encounter: Admit: 2023-12-21 | Discharge: 2023-12-21 | Payer: MEDICARE

## 2023-12-24 ENCOUNTER — Encounter: Admit: 2023-12-24 | Discharge: 2023-12-24 | Payer: MEDICARE

## 2023-12-24 MED ORDER — OMEPRAZOLE 40 MG PO CPDR
40 mg | ORAL_CAPSULE | Freq: Every day | ORAL | 1 refills | 90.00000 days | Status: AC
Start: 2023-12-24 — End: ?

## 2023-12-25 ENCOUNTER — Encounter: Admit: 2023-12-25 | Discharge: 2023-12-25 | Payer: MEDICARE

## 2023-12-25 DIAGNOSIS — F250 Schizoaffective disorder, bipolar type: Secondary | ICD-10-CM

## 2023-12-25 MED ORDER — CLONAZEPAM 1 MG PO TAB
ORAL_TABLET | ORAL | 0 refills | 30.00000 days | Status: AC
Start: 2023-12-25 — End: ?

## 2023-12-25 MED ORDER — LITHIUM CARBONATE 150 MG PO CAP
ORAL_CAPSULE | ORAL | 2 refills | 90.00000 days | Status: AC
Start: 2023-12-25 — End: ?

## 2023-12-26 ENCOUNTER — Encounter: Admit: 2023-12-26 | Discharge: 2023-12-26 | Payer: MEDICARE

## 2024-01-16 ENCOUNTER — Encounter: Admit: 2024-01-16 | Discharge: 2024-01-16 | Payer: MEDICARE

## 2024-01-21 ENCOUNTER — Encounter: Admit: 2024-01-21 | Discharge: 2024-01-21 | Payer: MEDICARE

## 2024-01-21 DIAGNOSIS — R339 Retention of urine, unspecified: Secondary | ICD-10-CM

## 2024-01-21 MED ORDER — TAMSULOSIN 0.4 MG PO CAP
ORAL_CAPSULE | ORAL | 0 refills | 90.00000 days | Status: AC
Start: 2024-01-21 — End: ?

## 2024-02-14 ENCOUNTER — Encounter: Admit: 2024-02-14 | Discharge: 2024-02-14 | Payer: MEDICARE

## 2024-02-25 ENCOUNTER — Encounter: Admit: 2024-02-25 | Discharge: 2024-02-25 | Payer: MEDICARE

## 2024-03-20 ENCOUNTER — Encounter: Admit: 2024-03-20 | Discharge: 2024-03-20 | Payer: MEDICARE

## 2024-03-20 DIAGNOSIS — R339 Retention of urine, unspecified: Principal | ICD-10-CM

## 2024-03-20 MED ORDER — TAMSULOSIN 0.4 MG PO CAP
ORAL_CAPSULE | ORAL | 0 refills | 90.00000 days | Status: AC
Start: 2024-03-20 — End: ?

## 2024-04-15 ENCOUNTER — Encounter: Admit: 2024-04-15 | Discharge: 2024-04-15 | Payer: MEDICARE

## 2024-04-20 ENCOUNTER — Encounter: Admit: 2024-04-20 | Discharge: 2024-04-20 | Payer: MEDICARE

## 2024-04-20 DIAGNOSIS — F250 Schizoaffective disorder, bipolar type: Principal | ICD-10-CM

## 2024-04-20 DIAGNOSIS — R339 Retention of urine, unspecified: Principal | ICD-10-CM

## 2024-04-20 MED ORDER — OLANZAPINE 15 MG PO TAB
15 mg | ORAL_TABLET | Freq: Every evening | ORAL | 2 refills | 30.00000 days | Status: AC
Start: 2024-04-20 — End: ?

## 2024-04-20 MED ORDER — TAMSULOSIN 0.4 MG PO CAP
ORAL_CAPSULE | ORAL | 0 refills | 90.00000 days | Status: AC
Start: 2024-04-20 — End: ?

## 2024-04-20 MED ORDER — OLANZAPINE 10 MG PO TAB
10 mg | ORAL_TABLET | Freq: Every morning | ORAL | 2 refills | 30.00000 days | Status: AC
Start: 2024-04-20 — End: ?

## 2024-04-20 MED ORDER — PROPRANOLOL 10 MG PO TAB
10 mg | ORAL_TABLET | Freq: Two times a day (BID) | ORAL | 2 refills | 30.00000 days | Status: AC
Start: 2024-04-20 — End: ?

## 2024-04-20 MED ORDER — OLANZAPINE 5 MG PO TBDI
ORAL_TABLET | ORAL | 2 refills | 30.00000 days | Status: AC
Start: 2024-04-20 — End: ?

## 2024-05-06 ENCOUNTER — Ambulatory Visit: Admit: 2024-05-06 | Discharge: 2024-05-07 | Payer: MEDICARE

## 2024-05-06 ENCOUNTER — Encounter: Admit: 2024-05-06 | Discharge: 2024-05-06 | Payer: MEDICARE

## 2024-05-06 VITALS — BP 125/88

## 2024-05-06 DIAGNOSIS — F902 Attention-deficit hyperactivity disorder, combined type: Secondary | ICD-10-CM

## 2024-05-06 DIAGNOSIS — F840 Autistic disorder: Principal | ICD-10-CM

## 2024-05-06 DIAGNOSIS — G4723 Circadian rhythm sleep disorder, irregular sleep wake type: Secondary | ICD-10-CM

## 2024-05-06 DIAGNOSIS — F250 Schizoaffective disorder, bipolar type: Secondary | ICD-10-CM

## 2024-05-06 NOTE — Progress Notes
 ATTENDING NOTE  I saw and evaluated Chad Fairy Federico Mickey., discussed with Chad JONELLE Georgi, MD and concur with the assessment and treatment plan. Patient is 58 y.o. male with ASD, IED, and ID. Psychiatric symptoms well controlled at today's encounter. Denies SI/HI and AVH and no other safety concerns. Pt reports no medication side effects.    PLAN:  The following medications will be continued for the above symptoms:  Continue Zyprexa  to 10mg  QAM and 15mg  QHS  Continue Lamictal  150mg  PO BID  Continue Lithium  300mg  PO QAM and 450mg  PO QHS  Continue Seroquel  400mg  PO BID and 600mg  PO QHS  Continue Restoril  45mg  PO QHS PRN  Continue Klonopin  0.5mg  PO QAM and 1mg  PO Daily  Continue Propranolol  10mg  PO BID  No labs needed      aspirin EC 81 mg tablet Take one tablet by mouth daily. Take with food.    atorvastatin (LIPITOR) 20 mg tablet Take one tablet by mouth at bedtime daily.    bisacodyl (DULCOLAX) 5 mg tablet Take 1 Tab by mouth twice daily.    clonazePAM  (KLONOPIN ) 1 mg tablet TAKE ONE-HALF TABLET BY MOUTH EVERY MORNING and TAKE ONE TABLET EVERY NIGHT AT BEDTIME    DOCOSAHEXANOIC ACID/EPA (FISH OIL PO) Take 2 Caps by mouth daily.    docusate (COLACE) 100 mg capsule Take one capsule by mouth twice daily.    glucosamine(+) 500 mg tab Take one tablet by mouth daily.    hydrocortisone  (HYTONE ) 2.5 % topical cream Apply  topically to affected area twice daily.    lamoTRIgine  (LAMICTAL ) 150 mg tablet Take one tablet by mouth twice daily.    levothyroxine (SYNTHROID) 100 mcg tablet Take one tablet by mouth daily. (Patient taking differently: Take 112 mcg by mouth daily.)    lithium  carbonate 150 mg capsule TAKE TWO CAPSULES BY MOUTH EVERY MORNING and TAKE THREE CAPSULES EVERY NIGHT AT BEDTIME WITH FOOD    lubiprostone  (AMITIZA ) 24 mcg capsule TAKE ONE CAPSULE BY MOUTH TWICE DAILY WITH MEALS    magnesium citrate oral solution Take 296 mL by mouth as Needed.    magnesium four hundred mEq twice daily.    OLANZapine  (ZYPREXA  ZYDIS) 5 mg rapid dissolve tablet DISSOLVE 1 TABLET BY MOUTH EVERY DAY AS NEEDED (call clinic to schedule visit ASAP)    OLANZapine  (ZYPREXA ) 10 mg tablet TAKE ONE TABLET BY MOUTH EVERY MORNING    OLANZapine  (ZYPREXA ) 15 mg tablet TAKE ONE TABLET BY MOUTH EVERY NIGHT AT BEDTIME    omeprazole  DR (PRILOSEC) 40 mg capsule TAKE ONE CAPSULE BY MOUTH EVERY DAY BEFORE BREAKFAST    plecanatide  (TRULANCE ) 3 mg tablet Take one-half tablet by mouth daily.    polyethylene glycol 3350  (GLYCOLAX ; MIRALAX ) 17 gram/dose powder Take seventeen g by mouth twice daily.    pramoxine-hydrocortisone  (ANALPRAM -HC) 1-1 % rectal cream Insert or Apply one g to rectal area as directed twice daily.    propranoloL  (INDERAL ) 10 mg tablet TAKE ONE TABLET BY MOUTH TWICE DAILY    QUEtiapine  (SEROQUEL ) 100 mg tablet TAKE ONE TABLET BY MOUTH EVERY MORNING AND 1 TABLET AT 2PM *TAKE ALONG WITH 300MG  TAB FOR DAILY DOSE OF 400MG *    QUEtiapine  (SEROQUEL ) 300 mg tablet TAKE ONE TABLET BY MOUTH TWICE DAILY (MORNING AND 2PM ALONGWITH 100MG  TAB) AND 2 TABLETS EVERY NIGHT AT BEDTIME    senna (SENOKOT) 8.6 mg tablet Take two tablets by mouth twice daily.    tamsulosin  (FLOMAX ) 0.4 mg capsule TAKE ONE CAPSULE BY MOUTH; TWICE  DAILY; DO NOT CRUSH, CHEW, OR SPLIT.; TAKE 30 MINUTES following the same meal each day    temazepam  (RESTORIL ) 15 mg capsule TAKE ONE CAPSULE BY MOUTH EVERY NIGHT AT BEDTIME IN ADDITION TO 30MG  FOR A TOTAL OF 45MG  AT BEDTIME    temazepam  (RESTORIL ) 30 mg capsule TAKE ONE CAPSULE BY MOUTH EVERY NIGHT AT BEDTIME       Wanda JONELLE JONETTA Marcey, DO  05/06/2024

## 2024-05-06 NOTE — Patient Instructions
 Today we discussed the following:    > Continue current regimen unchanged today  > Will consider addition of guanfacine for impulsivity and ADHD in the future    Please reach out via MyChart if you have questions or concerns.

## 2024-05-06 NOTE — Progress Notes
 Date of Service: 05/06/2024    Subjective:      Chad Randall is a 58 y.o. male  [id number]    History of Present Illness   Chad Randall is a 58 y.o. male with a past psychiatric history of schizoaffective disorder, bipolar type, ADHD, and ASD who is seen today for f/u. he was last seen on 05/10/23, at which time no medication changes were made. He is accompanied by his mother, Chad Randall, and brother, Chad Randall.     Since his last visit, things have been pretty good, but they believe his energy and agitation will start to ramp up in the coming months with his birthday and holidays, as this has been somewhat of a trend in the past. They report his behaviors and anger have been relatively well-controlled on his current regimen, requiring his prn olanzapine  medication a few times per week. Sleep is hit or miss, but over the last week he has been sleeping 8+ hours at least a few times per week, and at lest 5-6 hours per day throughout. They report his agitation has been as a whole well-controlled, behaviors are stable, denies significant side effects at this time. Constipation well-controlled with bowel regimen. Discussed plan to continue current regimen unchanged, consider guanfacine in the future.     Denies SI, HI, AVH.      Social history update:  Living situation: lives w/ mother and brother; father passed away in 2023-05-04  Relationships/family: has neighbors that they are very close with  Employment/income: retired, from day program; likes playing w/ his action figures, very into super heroes (favorite is [employer] & [employer])  Substance use: denies tobacco, EtOH, or illicit drug use     Past psychiatric history:  Hospitalizations: first admission at 58yo; hospitalized at [facility] from ages [age]; hospitalized at [facility] in May 2010, most recent admission in 2015     Previous medication trials:  Lithium  (hx of toxicity, sxs worsened w/ previous trials of discontinuation)  Clozapine (neutropenia)  Depakote  Loxapine  Oxcarbazepine  Ritalin  Amitriptyline (worsening of sxs)  Atomoxetine (arm dystonia)  Flurazepam  Zolpidem  Melatonin  Bupropion  Carbamazepine  Gabapentin  Monotherapy w/ antipsychotics historically ineffective    Review of Systems      Objective:          aspirin EC 81 mg tablet Take one tablet by mouth daily. Take with food.    atorvastatin (LIPITOR) 20 mg tablet Take one tablet by mouth at bedtime daily.    bisacodyl (DULCOLAX) 5 mg tablet Take 1 Tab by mouth twice daily.    clonazePAM  (KLONOPIN ) 1 mg tablet TAKE ONE-HALF TABLET BY MOUTH EVERY MORNING and TAKE ONE TABLET EVERY NIGHT AT BEDTIME    DOCOSAHEXANOIC ACID/EPA (FISH OIL PO) Take 2 Caps by mouth daily.    docusate (COLACE) 100 mg capsule Take one capsule by mouth twice daily.    glucosamine(+) 500 mg tab Take one tablet by mouth daily.    hydrocortisone  (HYTONE ) 2.5 % topical cream Apply  topically to affected area twice daily.    lamoTRIgine  (LAMICTAL ) 150 mg tablet Take one tablet by mouth twice daily.    levothyroxine (SYNTHROID) 100 mcg tablet Take one tablet by mouth daily.    lithium  carbonate 150 mg capsule TAKE TWO CAPSULES BY MOUTH EVERY MORNING and TAKE THREE CAPSULES EVERY NIGHT AT BEDTIME WITH FOOD    lubiprostone  (AMITIZA ) 24 mcg capsule TAKE ONE CAPSULE BY MOUTH TWICE  DAILY WITH MEALS    magnesium citrate oral solution Take 296 mL by mouth as Needed.    magnesium four hundred mEq twice daily.    OLANZapine  (ZYPREXA  ZYDIS) 5 mg rapid dissolve tablet DISSOLVE 1 TABLET BY MOUTH EVERY DAY AS NEEDED (call clinic to schedule visit ASAP)    OLANZapine  (ZYPREXA ) 10 mg tablet TAKE ONE TABLET BY MOUTH EVERY MORNING    OLANZapine  (ZYPREXA ) 15 mg tablet TAKE ONE TABLET BY MOUTH EVERY NIGHT AT BEDTIME    omeprazole  DR (PRILOSEC) 40 mg capsule TAKE ONE CAPSULE BY MOUTH EVERY DAY BEFORE BREAKFAST    plecanatide  (TRULANCE ) 3 mg tablet Take one-half tablet by mouth daily.    polyethylene glycol 3350  (GLYCOLAX ; MIRALAX ) 17 gram/dose powder Take seventeen g by mouth twice daily.    pramoxine-hydrocortisone  (ANALPRAM -HC) 1-1 % rectal cream Insert or Apply one g to rectal area as directed twice daily.    propranoloL  (INDERAL ) 10 mg tablet TAKE ONE TABLET BY MOUTH TWICE DAILY    QUEtiapine  (SEROQUEL ) 100 mg tablet TAKE ONE TABLET BY MOUTH EVERY MORNING AND 1 TABLET AT 2PM *TAKE ALONG WITH 300MG  TAB FOR DAILY DOSE OF 400MG *    QUEtiapine  (SEROQUEL ) 300 mg tablet TAKE ONE TABLET BY MOUTH TWICE DAILY (MORNING AND 2PM ALONGWITH 100MG  TAB) AND 2 TABLETS EVERY NIGHT AT BEDTIME    senna (SENOKOT) 8.6 mg tablet Take two tablets by mouth twice daily.    tamsulosin  (FLOMAX ) 0.4 mg capsule TAKE ONE CAPSULE BY MOUTH; TWICE DAILY; DO NOT CRUSH, CHEW, OR SPLIT.; TAKE 30 MINUTES following the same meal each day    temazepam  (RESTORIL ) 15 mg capsule TAKE ONE CAPSULE BY MOUTH EVERY NIGHT AT BEDTIME IN ADDITION TO 30MG  FOR A TOTAL OF 45MG  AT BEDTIME    temazepam  (RESTORIL ) 30 mg capsule TAKE ONE CAPSULE BY MOUTH EVERY NIGHT AT BEDTIME       There were no vitals filed for this visit.    There is no height or weight on file to calculate BMI.     Physical Exam    Mental Status Examination:  General/Constitutional: Appears stated age, dressed in casual attire, good grooming  Eye Contact: Good  Behavior: Calm, cooperative; appropriate for conversation  Speech: Normal rate, tone, and volume; good articulation  Mood: good!  Affect: Euthymic, appropriate range; mood congruent  Thought Process: Linear and goal directed  Thought Content: Does not report SI, HI; no evidence of paranoia or delusions  Perception: Does not report AVH; does not appear to be responding to internal stimuli  Associations: Intact  Insight/Judgment: limited/limited      Screenings:   PHQ-9  No data recorded               Summary/Formulation:   Chad Senne. is a 58 y.o. Caucasian male with a history of with a past psychiatric history of schizoaffective disorder, bipolar type, ADHD, and ASD who is seen today for f/u. He has been overall stable, though does continue to occasionally become upset in the afternoon/evening and is requiring prn olanzapine  a few times per week for this. Adjusted olanzapine  timing does appear to have helped. Goal is to continue the same total dose to reduce escalating antipsychotic burden. Could consider guanfacine for impulsivity in the future.    DSM-5 Diagnoses:  Schizoaffective disorder, bipolar type  ADHD, combined type  Autism spectrum disorder     Medical Diagnoses:  Hx of vitamin D deficiency  Hx of SIBO, H. Pylori infection  Plan:  Continue olanzapine  to 10mg  qam + 15mg  qhs (same total dose w/ different timings)  Mom to reach out on MyChart to update about how he's doing--if doing well plan to send in next refill of olanzapine  w/ two separate scripts w/ different doses instead of all 5mg  tablets  Continue olanzapine  ODT 5mg  qday prn agitation  Continue quetiapine  400mg  qam + 400mg  qnoon + 600mg  qhs mood, agitation  Continue clonazepam  0.5mg  qam + 1mg  qhs anxiety  Continue temazepam  45mg  qhs sleep  Continue lithium  300mg  qam + 450mg  qhs mood, agitation  Continue lamotrigine  150mg  bid mood, agitation  Continue propranolol  10mg  bid akathisia  Repeat lipids, Li level, BMP, TSH ordered  A1c 10/23/23: 5.3%   Lipid panel wnl 10/23/23  EKG 03/05/23: Qtc 446  Patient's mother brought in new guardianship paperwork, has been updated to include both Chad Randall and Scott as patient's legal guardian     Antipsychotic monitoring:  BMI Readings from Last 3 Encounters:   10/23/23 20.18 kg/m?   08/22/23 20.11 kg/m?   07/11/23 19.53 kg/m?     Lab Results   Component Value Date    CHOL 119 10/23/2023    TRIG 100 10/23/2023    HDL 52 10/23/2023    LDL 60 10/23/2023    VLDL 79.9 10/23/2023    NONHDLCHOL 67 10/23/2023    CHOLHDLC 3 10/17/2022       Lab Results   Component Value Date/Time    HGBA1C 5.3 10/23/2023 09:10 AM    HGBA1C 5.4 01/12/2022 09:34 AM     BP Readings from Last 3 Encounters:   10/23/23 (!) 132/92   08/22/23 (!) 129/94   07/11/23 114/82      AIMS Score (Calculated): 0    Lithium  Monitoring:  Lithium  level 12/2021: 1.0    Lab Results   Component Value Date/Time    TSH 6.58 (H) 10/23/2023 09:10 AM       Lab Results   Component Value Date/Time    NA 141 10/23/2023 09:10 AM    K 4.5 10/23/2023 09:10 AM    CA 9.9 10/23/2023 09:10 AM    CL 105 10/23/2023 09:10 AM    CO2 30 10/23/2023 09:10 AM    GAP 6 10/23/2023 09:10 AM    EGFR1 >60 01/09/2022 02:06 PM     Lab Results   Component Value Date/Time    BUN 21 10/23/2023 09:10 AM    CR 1.17 10/23/2023 09:10 AM    GLU 97 10/23/2023 09:10 AM       Lab Results   Component Value Date/Time    UCOLOR YELLOW 11/06/2017 12:45 AM    TURBID 1+ (A) 11/06/2017 12:45 AM    USPGR 1.016 11/06/2017 12:45 AM    UPH 6.0 11/06/2017 12:45 AM    UPROTEIN NEG 11/06/2017 12:45 AM    UAGLU NEG 11/06/2017 12:45 AM    UKET NEG 11/06/2017 12:45 AM    UBILE NEG 11/06/2017 12:45 AM    UBLD NEG 11/06/2017 12:45 AM    UROB NORMAL 11/06/2017 12:45 AM     Lab Results   Component Value Date/Time    UNIT POS (A) 11/06/2017 12:45 AM    ULEU 2+ (A) 11/06/2017 12:45 AM    UWBC 20-50 11/06/2017 12:45 AM    URBC 0-2 11/06/2017 12:45 AM         Note to patient:   The 21st Century Cures Act makes medical notes like these available to patients in the interest of transparency.  However, be advised this is a medical document and is intended as Corporate investment banker. It is written in medical language and may contain abbreviations or verbiage that are unfamiliar. It may appear blunt or direct. Medical documents are intended to carry relevant information, facts as evident, and the clinical opinion of the practitioner.    RTC: 12 weeks    Seen and discussed with Dr. Marcey    The proposed treatment plan was discussed with the patient/guardian who was provided the opportunity to ask questions and make suggestions regarding alternative treatment.     Morene JONELLE Georgi, MD

## 2024-05-15 ENCOUNTER — Encounter: Admit: 2024-05-15 | Discharge: 2024-05-15 | Payer: MEDICARE

## 2024-05-21 ENCOUNTER — Encounter: Admit: 2024-05-21 | Discharge: 2024-05-21 | Payer: MEDICARE

## 2024-05-25 ENCOUNTER — Encounter: Admit: 2024-05-25 | Discharge: 2024-05-25 | Payer: MEDICARE

## 2024-05-25 DIAGNOSIS — R339 Retention of urine, unspecified: Principal | ICD-10-CM

## 2024-05-25 MED ORDER — TAMSULOSIN 0.4 MG PO CAP
.4 mg | ORAL_CAPSULE | Freq: Every day | ORAL | 0 refills | 90.00000 days | Status: AC
Start: 2024-05-25 — End: ?

## 2024-05-27 ENCOUNTER — Encounter: Admit: 2024-05-27 | Discharge: 2024-05-27 | Payer: MEDICARE

## 2024-05-28 ENCOUNTER — Encounter: Admit: 2024-05-28 | Discharge: 2024-05-28 | Payer: MEDICARE

## 2024-05-28 DIAGNOSIS — R339 Retention of urine, unspecified: Principal | ICD-10-CM

## 2024-05-28 MED ORDER — TAMSULOSIN 0.4 MG PO CAP
.4 mg | ORAL_CAPSULE | Freq: Two times a day (BID) | ORAL | 0 refills | 90.00000 days | Status: AC
Start: 2024-05-28 — End: ?

## 2024-06-19 ENCOUNTER — Encounter: Admit: 2024-06-19 | Discharge: 2024-06-19 | Payer: MEDICARE

## 2024-06-19 DIAGNOSIS — K5904 Chronic idiopathic constipation: Secondary | ICD-10-CM

## 2024-06-19 DIAGNOSIS — K5900 Constipation, unspecified: Principal | ICD-10-CM

## 2024-06-19 MED ORDER — LUBIPROSTONE 24 MCG PO CAP
24 ug | ORAL_CAPSULE | Freq: Two times a day (BID) | ORAL | 3 refills | 90.00000 days | Status: AC
Start: 2024-06-19 — End: ?

## 2024-06-29 ENCOUNTER — Encounter: Admit: 2024-06-29 | Discharge: 2024-06-29 | Payer: MEDICARE

## 2024-06-29 MED ORDER — OMEPRAZOLE 40 MG PO CPDR
40 mg | ORAL_CAPSULE | Freq: Every day | ORAL | 0 refills | 90.00000 days | Status: AC
Start: 2024-06-29 — End: ?

## 2024-07-07 ENCOUNTER — Encounter: Admit: 2024-07-07 | Discharge: 2024-07-07 | Payer: MEDICARE

## 2024-07-07 DIAGNOSIS — R109 Unspecified abdominal pain: Principal | ICD-10-CM

## 2024-07-07 DIAGNOSIS — K5904 Chronic idiopathic constipation: Secondary | ICD-10-CM

## 2024-07-07 DIAGNOSIS — K5900 Constipation, unspecified: Principal | ICD-10-CM

## 2024-07-07 MED ORDER — TRULANCE 3 MG PO TAB
1.5 mg | ORAL_TABLET | Freq: Every day | ORAL | 1 refills | 30.00000 days | Status: AC
Start: 2024-07-07 — End: ?
  Filled 2024-07-10: qty 30, 60d supply, fill #0

## 2024-07-07 MED ORDER — CREON 24,000-76,000 -120,000 UNIT PO CPDR
ORAL_CAPSULE | ORAL | 0 refills | 30.00000 days | Status: AC
Start: 2024-07-07 — End: ?
  Filled 2024-07-10: qty 1300, 87d supply, fill #0

## 2024-07-08 ENCOUNTER — Encounter: Admit: 2024-07-08 | Discharge: 2024-07-08 | Payer: MEDICARE

## 2024-07-09 ENCOUNTER — Encounter: Admit: 2024-07-09 | Discharge: 2024-07-09 | Payer: MEDICARE

## 2024-07-12 ENCOUNTER — Encounter: Admit: 2024-07-12 | Discharge: 2024-07-12 | Payer: MEDICARE | Primary: General Practice

## 2024-07-15 ENCOUNTER — Encounter: Admit: 2024-07-15 | Discharge: 2024-07-15 | Payer: MEDICARE | Primary: General Practice

## 2024-07-15 DIAGNOSIS — F250 Schizoaffective disorder, bipolar type: Principal | ICD-10-CM

## 2024-07-15 MED ORDER — OLANZAPINE 10 MG PO TAB
10 mg | ORAL_TABLET | Freq: Every morning | ORAL | 2 refills | 30.00000 days | Status: AC
Start: 2024-07-15 — End: ?

## 2024-07-15 MED ORDER — OLANZAPINE 5 MG PO TBDI
ORAL_TABLET | ORAL | 2 refills | 30.00000 days | Status: AC
Start: 2024-07-15 — End: ?

## 2024-07-15 MED ORDER — OLANZAPINE 15 MG PO TAB
15 mg | ORAL_TABLET | Freq: Every evening | ORAL | 2 refills | 30.00000 days | Status: AC
Start: 2024-07-15 — End: ?

## 2024-07-15 MED ORDER — LAMOTRIGINE 150 MG PO TAB
150 mg | ORAL_TABLET | Freq: Two times a day (BID) | ORAL | 4 refills | 30.00000 days | Status: AC
Start: 2024-07-15 — End: ?

## 2024-07-15 MED ORDER — QUETIAPINE 300 MG PO TAB
ORAL_TABLET | ORAL | 4 refills | 30.00000 days | Status: AC
Start: 2024-07-15 — End: ?

## 2024-07-15 MED ORDER — PROPRANOLOL 10 MG PO TAB
10 mg | ORAL_TABLET | Freq: Two times a day (BID) | ORAL | 2 refills | 30.00000 days | Status: AC
Start: 2024-07-15 — End: ?

## 2024-07-16 ENCOUNTER — Encounter: Admit: 2024-07-16 | Discharge: 2024-07-16 | Payer: MEDICARE | Primary: General Practice

## 2024-07-27 ENCOUNTER — Encounter: Admit: 2024-07-27 | Discharge: 2024-07-27 | Payer: MEDICARE | Primary: General Practice

## 2024-08-04 ENCOUNTER — Encounter: Admit: 2024-08-04 | Discharge: 2024-08-04 | Payer: MEDICARE | Primary: General Practice

## 2024-08-20 ENCOUNTER — Encounter: Admit: 2024-08-20 | Discharge: 2024-08-20 | Payer: MEDICARE | Primary: General Practice

## 2024-08-20 DIAGNOSIS — R339 Retention of urine, unspecified: Principal | ICD-10-CM

## 2024-08-20 MED ORDER — TAMSULOSIN 0.4 MG PO CAP
ORAL_CAPSULE | ORAL | 0 refills | 90.00000 days | Status: AC
Start: 2024-08-20 — End: ?

## 2024-09-01 ENCOUNTER — Encounter: Admit: 2024-09-01 | Discharge: 2024-09-01 | Payer: MEDICARE | Primary: General Practice

## 2024-09-01 DIAGNOSIS — Z136 Encounter for screening for cardiovascular disorders: Principal | ICD-10-CM

## 2024-09-01 NOTE — Patient Instructions [37]
Thank you for visiting our office today.    We would like to make the following medication adjustments:  NONE       Otherwise continue the same medications as you have been doing.          We will be pursuing the following tests after your appointment today:       Orders Placed This Encounter    ECG 12-LEAD         We will plan to see you back in 12 months.  Please call us in the meantime with any questions or concerns.        Please allow 5-7 business days for our providers to review your results. All normal results will go to MyChart. If you do not have Mychart, it is strongly recommended to get this so you can easily view all your results. If you do not have mychart, we will attempt to call you once with normal lab and testing results. If we cannot reach you by phone with normal results, we will send you a letter.  If you have not heard the results of your testing after one week please give us a call.       Your Cardiovascular Medicine [place] Team (Steve, Lisa, Jamie, Melanie, and Devaunte Gasparini)  phone number is [phone].
